# Patient Record
Sex: Male | Born: 1975 | Race: Black or African American | Hispanic: No | Marital: Married | Smoking: Former smoker
Health system: Southern US, Community
[De-identification: ages and names within clinical notes are randomized; demographics above are authoritative.]

## PROBLEM LIST (undated history)

## (undated) DIAGNOSIS — I10 Essential (primary) hypertension: Secondary | ICD-10-CM

## (undated) DIAGNOSIS — I639 Cerebral infarction, unspecified: Secondary | ICD-10-CM

## (undated) DIAGNOSIS — E119 Type 2 diabetes mellitus without complications: Secondary | ICD-10-CM

---

## 2010-07-26 ENCOUNTER — Emergency Department: Payer: Self-pay | Admitting: Emergency Medicine

## 2012-07-22 ENCOUNTER — Emergency Department: Payer: Self-pay | Admitting: Emergency Medicine

## 2016-02-12 ENCOUNTER — Emergency Department
Admission: EM | Admit: 2016-02-12 | Discharge: 2016-02-12 | Disposition: A | Discharge status: Home / Self Care | Payer: Self-pay | Attending: Emergency Medicine | Admitting: Emergency Medicine

## 2016-02-12 ENCOUNTER — Encounter: Payer: Self-pay | Admitting: Emergency Medicine

## 2016-02-12 DIAGNOSIS — E1165 Type 2 diabetes mellitus with hyperglycemia: Secondary | ICD-10-CM | POA: Insufficient documentation

## 2016-02-12 DIAGNOSIS — R739 Hyperglycemia, unspecified: Secondary | ICD-10-CM

## 2016-02-12 DIAGNOSIS — I10 Essential (primary) hypertension: Secondary | ICD-10-CM

## 2016-02-12 DIAGNOSIS — Z794 Long term (current) use of insulin: Secondary | ICD-10-CM | POA: Insufficient documentation

## 2016-02-12 DIAGNOSIS — F17200 Nicotine dependence, unspecified, uncomplicated: Secondary | ICD-10-CM | POA: Insufficient documentation

## 2016-02-12 HISTORY — DX: Type 2 diabetes mellitus without complications: E11.9

## 2016-02-12 HISTORY — DX: Essential (primary) hypertension: I10

## 2016-02-12 LAB — BASIC METABOLIC PANEL
ANION GAP: 6 (ref 5–15)
BUN: 8 mg/dL (ref 6–20)
CHLORIDE: 104 mmol/L (ref 101–111)
CO2: 28 mmol/L (ref 22–32)
Calcium: 9 mg/dL (ref 8.9–10.3)
Creatinine, Ser: 1.12 mg/dL (ref 0.61–1.24)
GFR calc Af Amer: >60 mL/min (ref >60)
GLUCOSE: 147 mg/dL — AB (ref 65–99)
POTASSIUM: 4 mmol/L (ref 3.5–5.1)
SODIUM: 138 mmol/L (ref 135–145)

## 2016-02-12 LAB — GLUCOSE, CAPILLARY: GLUCOSE-CAPILLARY: 230 mg/dL — AB (ref 65–99)

## 2016-02-12 MED ORDER — LISINOPRIL 20 MG PO TABS: 20.0000 mg | ORAL_TABLET | Freq: Every day | ORAL | 0 refills | Status: DC

## 2016-02-12 NOTE — Discharge Instructions (Signed)
As we discussed, though you do have high blood pressure (hypertension), fortunately it is not immediately dangerous at this time however I we'll refill your prescription.  Please continue using your insulin as previously prescribed by your doctor.  Return to the Emergency Department (ED) if you experience any chest pain/pressure/tightness, difficulty breathing, or sudden sweating, or other symptoms that concern you.

## 2016-02-12 NOTE — ED Notes (Signed)
states he felt like his b/p was elevated and feeling dizzy...  also noticed that his glucose is also up and down  Has not had any meds for diabetes or htn..Marland Kitchen

## 2016-02-12 NOTE — ED Provider Notes (Signed)
[facility] Emergency Department Provider Note   ____________________________________________   First MD Initiated Contact with Patient 02/12/16 1410     (approximate)  I have reviewed the triage vital signs and the nursing notes.   HISTORY  Chief Complaint Hypertension    HPI Stanley Farley is a 40 y.o. male presents for evaluation of high blood pressure. Patient tells me for about a month he is not on any blood pressure medicine because his clinic will not refill his medicine without pain for a visit. He also reports he is diabetic and he ran out of insulin for which she takes about 72 units of Lantus daily. He has not had this for about a month.  Over the last several days he's felt occasionally sweaty, lightheaded, and had a mild headache off and on. Not associated nausea vomiting fevers or chills. No numbness tingling or weakness. Reports he checked his blood pressure didn't [facility] and it was over 200 prompting evaluation today.  Unfortunately the patient cannot recall her blood pressure medicine he is on. Reports he's had similar symptoms in the past that improved after blood pressure management.   Past Medical History:  Diagnosis Date  . Diabetes mellitus without complication (HCC)   . Hypertension     There are no active problems to display for this patient.   History reviewed. No pertinent surgical history.  Prior to Admission medications   Medication Sig Start Date End Date Taking? Authorizing Provider  cetirizine (ZYRTEC) 10 MG tablet Take 10 mg by mouth daily.   Yes Historical Provider, MD  clindamycin (CLEOCIN) 300 MG capsule Take 300 mg by mouth 4 (four) times daily.   Yes Historical Provider, MD  glipiZIDE (GLUCOTROL XL) 10 MG 24 hr tablet Take 10 mg by mouth daily with breakfast.   Yes Historical Provider, MD  HYDROcodone-acetaminophen (NORCO/VICODIN) 5-325 MG tablet Take 1 tablet by mouth every 4 (four) hours as needed for  moderate pain.   Yes Historical Provider, MD  ibuprofen (ADVIL,MOTRIN) 800 MG tablet Take 800 mg by mouth every 6 (six) hours as needed.   Yes Historical Provider, MD  insulin detemir (LEVEMIR) 100 UNIT/ML injection Inject 60 Units into the skin at bedtime.   Yes Historical Provider, MD  sildenafil (VIAGRA) 50 MG tablet Take 50 mg by mouth daily as needed for erectile dysfunction.   Yes Historical Provider, MD  lisinopril (PRINIVIL,ZESTRIL) 20 MG tablet Take 1 tablet (20 mg total) by mouth daily. 02/12/16   Sharyn Creamer, MD    Allergies Review of patient's allergies indicates no known allergies.  History reviewed. No pertinent family history.  Social History Social History  Substance Use Topics  . Smoking status: Current Every Day Smoker  . Smokeless tobacco: Never Used  . Alcohol use Yes    Review of Systems Constitutional: No fever/chills Eyes: No visual changes. ENT: No sore throat. Cardiovascular: Denies chest pain. Respiratory: Denies shortness of breath. Gastrointestinal: No abdominal pain.  No nausea, no vomiting.  No diarrhea.  No constipation. Genitourinary: Negative for dysuria. Musculoskeletal: Negative for back pain. Skin: Negative for rash. Neurological: Negative for focal weakness or numbness.  10-point ROS otherwise negative.  ____________________________________________   PHYSICAL EXAM:  VITAL SIGNS: ED Triage Vitals [02/12/16 1111]  Enc Vitals Group     BP (!) 155/90     Pulse Rate 71     Resp 18     Temp 98.9 F (37.2 C)     Temp Source Oral  SpO2 100 %     Weight 225 lb (102.1 kg)     Height 6\' 2"  (1.88 m)     Head Circumference      Peak Flow      Pain Score      Pain Loc      Pain Edu?      Excl. in GC?     Constitutional: Alert and oriented. Well appearing and in no acute distress. Eyes: Conjunctivae are normal. PERRL. EOMI. Head: Atraumatic. Nose: No congestion/rhinnorhea. Mouth/Throat: Mucous membranes are moist.  Neck: No  stridor.   Cardiovascular: Normal rate, regular rhythm. Grossly normal heart sounds.  Good peripheral circulation. Respiratory: Normal respiratory effort.  No retractions. Lungs CTAB. Gastrointestinal: Soft and nontender. No distention.  Musculoskeletal: No lower extremity tenderness nor edema. Neurologic:  Normal speech and language. No gross focal neurologic deficits are appreciated. Skin:  Skin is warm, dry and intact. No rash noted. Psychiatric: Mood and affect are normal. Speech and behavior are normal.  ____________________________________________   LABS (all labs ordered are listed, but only abnormal results are displayed)  Labs Reviewed  GLUCOSE, CAPILLARY - Abnormal; Notable for the following:       Result Value   Glucose-Capillary 230 (*)    All other components within normal limits  BASIC METABOLIC PANEL - Abnormal; Notable for the following:    Glucose, Bld 147 (*)    All other components within normal limits   ____________________________________________  EKG  Reviewed and interpreted me at 1520 Fidgeted rate 65 PR 1:30 QRS 80 QTc 400 Normal sinus rhythm, no evidence of acute ischemic abnormality. Some questionable early repol seen in V3.____________________________________________  RADIOLOGY  No indication noted for imaging. ____________________________________________   PROCEDURES  Procedure(s) performed: None  Procedures  Critical Care performed: No  ____________________________________________   INITIAL IMPRESSION / ASSESSMENT AND PLAN / ED COURSE  Pertinent labs & imaging results that were available during my care of the patient were reviewed by me and considered in my medical decision making (see chart for details).  Patient presents for evaluation of concerns for elevated blood pressure. He ran out of medicine unfortunately is not able to recall what the blood pressure medicine was. Also tells me to refill of his insulin. Evidently he is not  able take obtain primary care right now due to a financial issue, however reports this has improved but he can't get an appointment for a few weeks.  Very reassuring examination. The pressure in elevated, but not emergent level at this time.  Clinical Course   ----------------------------------------- 4:17 PM on 02/12/2016 -----------------------------------------  Patient resting comfortably. Labs, EKG reassuring. Discussed with the patient and he has run out of his lisinopril which I'll refill. I do not see evidence for need for emergent blood pressure lowering today. In addition, we discussed his insulin use he reports that he has prescriptions for his insulin he just has not had $10 to afford to obtain refills. Discussed the importance of compliance, and I also discussed medication management clinic with the patient. We also further discuss financial counseling, and he'll follow-up with his doctor and seek financial counseling and also I recommended he go to medication management clinic for assistance. Return precautions and treatment recommendations and follow-up discussed with the patient who is agreeable with the plan.   ____________________________________________   FINAL CLINICAL IMPRESSION(S) / ED DIAGNOSES  Final diagnoses:  Essential hypertension  Hyperglycemia      NEW MEDICATIONS STARTED DURING THIS VISIT:  Current Discharge  Medication List       Note:  This document was prepared using Dragon voice recognition software and may include unintentional dictation errors.     Sharyn CreamerMark Farren Nelles, MD 02/12/16 [phone]

## 2016-02-12 NOTE — ED Triage Notes (Signed)
Pt presents to ED c/o elevated BP of 200/76 at [facility] this morning. Pt concerned because he has no BP refills. Pt also concerned about blood sugar because he has been unable to check it at home.Pt only reports s/s headaches and sweating.

## 2017-02-01 ENCOUNTER — Inpatient Hospital Stay
Admission: EM | Admit: 2017-02-01 | Discharge: 2017-02-04 | DRG: 065 | Disposition: A | Discharge status: Rehabilitation Facility | Payer: Medicaid Other | Attending: Internal Medicine | Admitting: Internal Medicine

## 2017-02-01 ENCOUNTER — Emergency Department: Payer: Medicaid Other

## 2017-02-01 ENCOUNTER — Encounter: Payer: Self-pay | Admitting: Emergency Medicine

## 2017-02-01 DIAGNOSIS — I638 Other cerebral infarction: Principal | ICD-10-CM | POA: Diagnosis present

## 2017-02-01 DIAGNOSIS — R29710 NIHSS score 10: Secondary | ICD-10-CM | POA: Diagnosis present

## 2017-02-01 DIAGNOSIS — E1142 Type 2 diabetes mellitus with diabetic polyneuropathy: Secondary | ICD-10-CM

## 2017-02-01 DIAGNOSIS — Z8249 Family history of ischemic heart disease and other diseases of the circulatory system: Secondary | ICD-10-CM | POA: Diagnosis not present

## 2017-02-01 DIAGNOSIS — R531 Weakness: Secondary | ICD-10-CM | POA: Diagnosis present

## 2017-02-01 DIAGNOSIS — E119 Type 2 diabetes mellitus without complications: Secondary | ICD-10-CM | POA: Diagnosis present

## 2017-02-01 DIAGNOSIS — N179 Acute kidney failure, unspecified: Secondary | ICD-10-CM | POA: Diagnosis present

## 2017-02-01 DIAGNOSIS — I10 Essential (primary) hypertension: Secondary | ICD-10-CM | POA: Diagnosis present

## 2017-02-01 DIAGNOSIS — Z833 Family history of diabetes mellitus: Secondary | ICD-10-CM

## 2017-02-01 DIAGNOSIS — Z9114 Patient's other noncompliance with medication regimen: Secondary | ICD-10-CM

## 2017-02-01 DIAGNOSIS — E785 Hyperlipidemia, unspecified: Secondary | ICD-10-CM

## 2017-02-01 DIAGNOSIS — G8194 Hemiplegia, unspecified affecting left nondominant side: Secondary | ICD-10-CM | POA: Diagnosis present

## 2017-02-01 DIAGNOSIS — I639 Cerebral infarction, unspecified: Secondary | ICD-10-CM | POA: Diagnosis present

## 2017-02-01 DIAGNOSIS — F17290 Nicotine dependence, other tobacco product, uncomplicated: Secondary | ICD-10-CM | POA: Diagnosis present

## 2017-02-01 DIAGNOSIS — I69354 Hemiplegia and hemiparesis following cerebral infarction affecting left non-dominant side: Secondary | ICD-10-CM

## 2017-02-01 DIAGNOSIS — F1210 Cannabis abuse, uncomplicated: Secondary | ICD-10-CM

## 2017-02-01 DIAGNOSIS — I6789 Other cerebrovascular disease: Secondary | ICD-10-CM | POA: Diagnosis present

## 2017-02-01 DIAGNOSIS — Z823 Family history of stroke: Secondary | ICD-10-CM

## 2017-02-01 DIAGNOSIS — E1165 Type 2 diabetes mellitus with hyperglycemia: Secondary | ICD-10-CM

## 2017-02-01 DIAGNOSIS — Z794 Long term (current) use of insulin: Secondary | ICD-10-CM

## 2017-02-01 DIAGNOSIS — R4781 Slurred speech: Secondary | ICD-10-CM | POA: Diagnosis present

## 2017-02-01 DIAGNOSIS — Z720 Tobacco use: Secondary | ICD-10-CM

## 2017-02-01 HISTORY — DX: Cerebral infarction, unspecified: I63.9

## 2017-02-01 LAB — COMPREHENSIVE METABOLIC PANEL
ALBUMIN: 3.3 g/dL — AB (ref 3.5–5.0)
ALT: 14 U/L — ABNORMAL LOW (ref 17–63)
ANION GAP: 7 (ref 5–15)
AST: 22 U/L (ref 15–41)
Alkaline Phosphatase: 63 U/L (ref 38–126)
BILIRUBIN TOTAL: 0.5 mg/dL (ref 0.3–1.2)
BUN: 22 mg/dL — AB (ref 6–20)
CHLORIDE: 102 mmol/L (ref 101–111)
CO2: 28 mmol/L (ref 22–32)
Calcium: 9 mg/dL (ref 8.9–10.3)
Creatinine, Ser: 1.36 mg/dL — ABNORMAL HIGH (ref 0.61–1.24)
GFR calc Af Amer: >60 mL/min (ref >60)
Glucose, Bld: 310 mg/dL — ABNORMAL HIGH (ref 65–99)
POTASSIUM: 4.2 mmol/L (ref 3.5–5.1)
Sodium: 137 mmol/L (ref 135–145)
Total Protein: 6.9 g/dL (ref 6.5–8.1)

## 2017-02-01 LAB — DIFFERENTIAL
Basophils Absolute: 0 10*3/uL (ref 0–0.1)
Basophils Relative: 1 %
EOS ABS: 0.4 10*3/uL (ref 0–0.7)
EOS PCT: 6 %
LYMPHS ABS: 2.2 10*3/uL (ref 1.0–3.6)
Lymphocytes Relative: 30 %
MONOS PCT: 10 %
Monocytes Absolute: 0.7 10*3/uL (ref 0.2–1.0)
NEUTROS PCT: 53 %
Neutro Abs: 3.8 10*3/uL (ref 1.4–6.5)

## 2017-02-01 LAB — CBC
HCT: 37.6 % — ABNORMAL LOW (ref 40.0–52.0)
HEMOGLOBIN: 13.1 g/dL (ref 13.0–18.0)
MCH: 33.1 pg (ref 26.0–34.0)
MCHC: 34.8 g/dL (ref 32.0–36.0)
MCV: 95 fL (ref 80.0–100.0)
Platelets: 208 10*3/uL (ref 150–440)
RBC: 3.95 MIL/uL — ABNORMAL LOW (ref 4.40–5.90)
RDW: 13.4 % (ref 11.5–14.5)
WBC: 7.2 10*3/uL (ref 3.8–10.6)

## 2017-02-01 LAB — PROTIME-INR
INR: 0.91
Prothrombin Time: 12.2 seconds (ref 11.4–15.2)

## 2017-02-01 LAB — GLUCOSE, CAPILLARY
GLUCOSE-CAPILLARY: 271 mg/dL — AB (ref 65–99)
GLUCOSE-CAPILLARY: 262 mg/dL — AB (ref 65–99)
Glucose-Capillary: 182 mg/dL — ABNORMAL HIGH (ref 65–99)

## 2017-02-01 LAB — APTT: aPTT: 27 seconds (ref 24–36)

## 2017-02-01 LAB — TROPONIN I

## 2017-02-01 MED ORDER — SENNOSIDES-DOCUSATE SODIUM 8.6-50 MG PO TABS: 1.0000 | ORAL_TABLET | Freq: Every evening | ORAL | Status: DC | PRN

## 2017-02-01 MED ORDER — STROKE: EARLY STAGES OF RECOVERY BOOK
Freq: Once | Status: AC
  Administered 2017-02-01: 16:00:00

## 2017-02-01 MED ORDER — ASPIRIN 300 MG RE SUPP: 300.0000 mg | Freq: Every day | RECTAL | Status: DC

## 2017-02-01 MED ORDER — LISINOPRIL 20 MG PO TABS
20.0000 mg | ORAL_TABLET | Freq: Every day | ORAL | Status: DC
  Administered 2017-02-01 – 2017-02-04 (×4): 20 mg via ORAL
  Filled 2017-02-01 (×4): qty 1

## 2017-02-01 MED ORDER — INSULIN ASPART 100 UNIT/ML ~~LOC~~ SOLN: 0.0000 [IU] | Freq: Every day | SUBCUTANEOUS | Status: DC

## 2017-02-01 MED ORDER — ONDANSETRON HCL 4 MG/2ML IJ SOLN
INTRAMUSCULAR | Status: AC
  Administered 2017-02-01: 4 mg via INTRAVENOUS
  Filled 2017-02-01: qty 2

## 2017-02-01 MED ORDER — ACETAMINOPHEN 325 MG PO TABS: 650.0000 mg | ORAL_TABLET | Freq: Four times a day (QID) | ORAL | Status: DC | PRN

## 2017-02-01 MED ORDER — ACETAMINOPHEN 650 MG RE SUPP: 650.0000 mg | RECTAL | Status: DC | PRN

## 2017-02-01 MED ORDER — HYDROXYZINE HCL 25 MG PO TABS
25.0000 mg | ORAL_TABLET | Freq: Three times a day (TID) | ORAL | Status: DC | PRN
  Administered 2017-02-02: 03:00:00 25 mg via ORAL
  Filled 2017-02-01 (×2): qty 1

## 2017-02-01 MED ORDER — IOPAMIDOL (ISOVUE-370) INJECTION 76%
75.0000 mL | Freq: Once | INTRAVENOUS | Status: AC | PRN
  Administered 2017-02-01: 75 mL via INTRAVENOUS

## 2017-02-01 MED ORDER — ASPIRIN 325 MG PO TABS
325.0000 mg | ORAL_TABLET | Freq: Every day | ORAL | Status: DC
  Administered 2017-02-02 – 2017-02-04 (×3): 325 mg via ORAL
  Filled 2017-02-01 (×3): qty 1

## 2017-02-01 MED ORDER — ACETAMINOPHEN 160 MG/5ML PO SOLN
650.0000 mg | ORAL | Status: DC | PRN
  Filled 2017-02-01: qty 20.3

## 2017-02-01 MED ORDER — ONDANSETRON HCL 4 MG/2ML IJ SOLN
4.0000 mg | Freq: Once | INTRAMUSCULAR | Status: AC
  Administered 2017-02-01: 4 mg via INTRAVENOUS

## 2017-02-01 MED ORDER — ACETAMINOPHEN 325 MG PO TABS: 650.0000 mg | ORAL_TABLET | ORAL | Status: DC | PRN

## 2017-02-01 MED ORDER — ENOXAPARIN SODIUM 40 MG/0.4ML ~~LOC~~ SOLN
40.0000 mg | SUBCUTANEOUS | Status: DC
  Administered 2017-02-01 – 2017-02-03 (×3): 40 mg via SUBCUTANEOUS
  Filled 2017-02-01 (×3): qty 0.4

## 2017-02-01 MED ORDER — NICOTINE 14 MG/24HR TD PT24
14.0000 mg | MEDICATED_PATCH | Freq: Every day | TRANSDERMAL | Status: DC
  Administered 2017-02-02 – 2017-02-04 (×3): 14 mg via TRANSDERMAL
  Filled 2017-02-01 (×4): qty 1

## 2017-02-01 MED ORDER — OXYCODONE-ACETAMINOPHEN 5-325 MG PO TABS: 1.0000 | ORAL_TABLET | Freq: Four times a day (QID) | ORAL | Status: DC | PRN

## 2017-02-01 MED ORDER — TRAZODONE HCL 50 MG PO TABS
50.0000 mg | ORAL_TABLET | Freq: Every evening | ORAL | Status: DC | PRN
  Administered 2017-02-01 – 2017-02-03 (×3): 50 mg via ORAL
  Filled 2017-02-01 (×3): qty 1

## 2017-02-01 MED ORDER — SODIUM CHLORIDE 0.9 % IV SOLN
INTRAVENOUS | Status: DC
  Administered 2017-02-01 – 2017-02-02 (×2): via INTRAVENOUS

## 2017-02-01 MED ORDER — ASPIRIN 81 MG PO CHEW
324.0000 mg | CHEWABLE_TABLET | Freq: Once | ORAL | Status: AC
  Administered 2017-02-01: 324 mg via ORAL
  Filled 2017-02-01: qty 4

## 2017-02-01 MED ORDER — ONDANSETRON HCL 4 MG/2ML IJ SOLN: 4.0000 mg | Freq: Four times a day (QID) | INTRAMUSCULAR | Status: DC | PRN

## 2017-02-01 MED ORDER — ATORVASTATIN CALCIUM 20 MG PO TABS
40.0000 mg | ORAL_TABLET | Freq: Every day | ORAL | Status: DC
  Administered 2017-02-01 – 2017-02-03 (×3): 40 mg via ORAL
  Filled 2017-02-01 (×3): qty 2

## 2017-02-01 MED ORDER — INSULIN ASPART 100 UNIT/ML ~~LOC~~ SOLN
0.0000 [IU] | Freq: Three times a day (TID) | SUBCUTANEOUS | Status: DC
  Administered 2017-02-01: 5 [IU] via SUBCUTANEOUS
  Administered 2017-02-02: 3 [IU] via SUBCUTANEOUS
  Administered 2017-02-02: 2 [IU] via SUBCUTANEOUS
  Administered 2017-02-02: 3 [IU] via SUBCUTANEOUS
  Administered 2017-02-03: 2 [IU] via SUBCUTANEOUS
  Administered 2017-02-03: 08:00:00 1 [IU] via SUBCUTANEOUS
  Filled 2017-02-01 (×6): qty 1

## 2017-02-01 NOTE — ED Triage Notes (Signed)
FIRST NURSE NOTE-last normal was last night. Left side weakness. Pt tearful.

## 2017-02-01 NOTE — ED Provider Notes (Signed)
[facility] Emergency Department Provider Note  Time seen: 11:08 AM  I have reviewed the triage vital signs and the nursing notes.   HISTORY  Chief Complaint Weakness    HPI Stanley Farley is a 41 y.o. male with a past medical history of diabetes, hypertension, presents to the emergency department for left arm and leg weakness. According to the patient since awakening this morning he has been experiencing left arm and leg weakness. No history of CVA in the past. Patient also nauseated with vomiting in the emergency department and hypertensive 175/86. Patient is prescribed hypertensive medications which he states she has been taking.Patient denies any headache or confusion. Denies any facial weakness or numbness. Patient denies any trouble speaking. Patient states his left arm and leg are weak but feel normal.  Past Medical History:  Diagnosis Date  . Diabetes mellitus without complication (HCC)   . Hypertension     There are no active problems to display for this patient.   History reviewed. No pertinent surgical history.  Prior to Admission medications   Medication Sig Start Date End Date Taking? Authorizing Provider  cetirizine (ZYRTEC) 10 MG tablet Take 10 mg by mouth daily.    [provider]  clindamycin (CLEOCIN) 300 MG capsule Take 300 mg by mouth 4 (four) times daily.    [provider]  glipiZIDE (GLUCOTROL XL) 10 MG 24 hr tablet Take 10 mg by mouth daily with breakfast.    [provider]  HYDROcodone-acetaminophen (NORCO/VICODIN) 5-325 MG tablet Take 1 tablet by mouth every 4 (four) hours as needed for moderate pain.    [provider]  ibuprofen (ADVIL,MOTRIN) 800 MG tablet Take 800 mg by mouth every 6 (six) hours as needed.    [provider]  insulin detemir (LEVEMIR) 100 UNIT/ML injection Inject 60 Units into the skin at bedtime.    [provider]  lisinopril (PRINIVIL,ZESTRIL) 20 MG  tablet Take 1 tablet (20 mg total) by mouth daily. 02/12/16   Sharyn CreamerQuale, Mark, MD  sildenafil (VIAGRA) 50 MG tablet Take 50 mg by mouth daily as needed for erectile dysfunction.    [provider]    No Known Allergies  No family history on file.  Social History Social History  Substance Use Topics  . Smoking status: Current Every Day Smoker  . Smokeless tobacco: Never Used  . Alcohol use No    Review of Systems Constitutional: Negative for fever. Cardiovascular: Negative for chest pain. Respiratory: Negative for shortness of breath. Gastrointestinal: Negative for abdominal pain. Nausea and vomiting in the emergency department none at home. Negative for diarrhea. Musculoskeletal: Negative for back pain, negative for neck pain. Neurological: Negative for headache. Weakness in the left arm and leg, no numbness. All other ROS negative  ____________________________________________   PHYSICAL EXAM:  VITAL SIGNS: ED Triage Vitals  Enc Vitals Group     BP 02/01/17 1028 (!) 175/86     Pulse Rate 02/01/17 1028 68     Resp 02/01/17 1028 20     Temp 02/01/17 1028 98.2 F (36.8 C)     Temp Source 02/01/17 1028 Oral     SpO2 02/01/17 1028 100 %     Weight 02/01/17 1029 225 lb (102.1 kg)     Height 02/01/17 1029 6\' 2"  (1.88 m)     Head Circumference --      Peak Flow --      Pain Score --      Pain Loc --  Pain Edu? --      Excl. in GC? --     Constitutional: Alert and oriented. Lying in bed, no distress. Eyes: Normal exam ENT   Head: Normocephalic and atraumatic.   Mouth/Throat: Mucous membranes are moist. Cardiovascular: Normal rate, regular rhythm. No murmur Respiratory: Normal respiratory effort without tachypnea nor retractions. Breath sounds are clear  Gastrointestinal: Soft and nontender. No distention.   Musculoskeletal: Patient is able to move all extremities although left-sided extremities are much more weak than right-sided. Neurologic:  Patient  has slight slurring of speech although he denies any difficulty speaking and feels like he is speaking normally. No cranial nerve deficits identified. They should has significant weakness of the left upper and lower extremity, 3/5. Sensation is intact and equal. Left upper and lower extremity drift after one or 2 seconds. Skin:  Skin is warm, dry and intact.  Psychiatric: Mood and affect are normal.   ____________________________________________    EKG  EKG reviewed and interpreted by myself shows normal sinus rhythm at 64 bpm, narrow QRS, normal axis, normal intervals, no ST changes. Overall reassuring EKG.  ____________________________________________    RADIOLOGY  CT head negative  ____________________________________________   INITIAL IMPRESSION / ASSESSMENT AND PLAN / ED COURSE  Pertinent labs & imaging results that were available during my care of the patient were reviewed by me and considered in my medical decision making (see chart for details).  Patient presents to the emergency department with significant left upper and lower extremity weakness. Patient has significant deficits on exam, became acutely nauseated with several episodes of vomiting in the emergency department. Blood pressure currently 175/86. Besides the weakness the patient's neurological exam is otherwise intact besides mild slurring of speech although he denies any slurred speech. Given the patient's acute onset of symptoms we will proceed with a CT angiography of the head and neck to rule out any large vessel occlusion as well as an MRI. Patient will require admission to the hospital for further treatment. Unfortunately as the patient awoke this morning with symptoms there is no clear onset of symptoms. Patient states he went to bed around midnight last night and was normal. He feels like the left arm and leg weakness has progressively worsened throughout the day today. Patient is outside of any TPA window.   CT  is negative we will dose 324 mg aspirin in the emergency department.   NIH Stroke Scale   Interval: Baseline Time: 11:14 AM Person Administering Scale: Lorina Duffner  Administer stroke scale items in the order listed. Record performance in each category after each subscale exam. Do not go back and change scores. Follow directions provided for each exam technique. Scores should reflect what the patient does, not what the clinician thinks the patient can do. The clinician should record answers while administering the exam and work quickly. Except where indicated, the patient should not be coached (i.e., repeated requests to patient to make a special effort).   1a  Level of consciousness: 0=alert; keenly responsive  1b. LOC questions:  0=Performs both tasks correctly  1c. LOC commands: 0=Performs both tasks correctly  2.  Best Gaze: 0=normal  3.  Visual: 0=No visual loss  4. Facial Palsy: 0=Normal symmetric movement  5a.  Motor left arm: 3=No effort against gravity, limb falls  5b.  Motor right arm: 0=No drift, limb holds 90 (or 45) degrees for full 10 seconds  6a. motor left leg: 3=No effort against gravity, limb falls  6b  Motor right  leg:  0=No drift, limb holds 90 (or 45) degrees for full 10 seconds  7. Limb Ataxia: 2=Present in two limbs  8.  Sensory: 0=Normal; no sensory loss  9. Best Language:  0=No aphasia, normal  10. Dysarthria: 1=Mild to moderate, patient slurs at least some words and at worst, can be understood with some difficulty  11. Extinction and Inattention: 0=No abnormality  12. Distal motor function: 1=At least some extension after 5 seconds, but is not fully extended. Any movement of the fingers which is not in response to a command is not scored   Total:   10    CT angiography of the head and neck is read as negative. Patient continues with left-sided deficits. Concern for acute CVA. Patient will be admitted to the hospital for further management. MRI  pending. ____________________________________________   FINAL CLINICAL IMPRESSION(S) / ED DIAGNOSES  Left-sided weakness CVA   Minna Antis, MD 02/01/17 1156

## 2017-02-01 NOTE — ED Triage Notes (Signed)
Pt reports woke up this morning with left side weakness, slurred speech reports went to bed last night around midnight. Pt denies any pain. Pt talks in complete sentences

## 2017-02-01 NOTE — H&P (Addendum)
[employer] [facility] [facility] [place] [facility]   PATIENT NAME: Stanley Farley    MR#:  [id number]  DATE OF BIRTH:  [birth year]  DATE OF ADMISSION:  02/01/2017  PRIMARY CARE PHYSICIAN: Emogene MorganAycock, Ngwe A, MD   REQUESTING/REFERRING PHYSICIAN: Minna AntisPaduchowski, Kevin, MD  CHIEF COMPLAINT:   Chief Complaint  Patient presents with  . Weakness   Left-sided weakness today. HISTORY OF PRESENT ILLNESS:  Stanley Shelterravis Wachob  is a 41 y.o. male with a known history of Hypertension and diabetes. The patient presently ED with the left arm and leg weakness starting at 9 a.m. today. The patient had nausea and vomiting but denies any sensation changes, no slurred speech or incontinence. He denies any headache or dizziness. He denies any depression or anxiety but has distress from family. He was given aspirin in the ED but left-sided weakness is still not improving. The patient hasn't taken glipizide and Levemir for 1 month due to hypoglycemia.CT head angiogram didn't show any acute stroke or large vessel occlusion.  PAST MEDICAL HISTORY:   Past Medical History:  Diagnosis Date  . Diabetes mellitus without complication (HCC)   . Hypertension     PAST SURGICAL HISTORY:  History reviewed. No pertinent surgical history. No surgical history.  SOCIAL HISTORY:   Social History  Substance Use Topics  . Smoking status: Current Every Day Smoker    Years: 5.00    Types: Cigars  . Smokeless tobacco: Never Used  . Alcohol use No    FAMILY HISTORY:   Family History  Problem Relation Age of Onset  . Diabetes Mother   . Hypertension Father   . Heart attack Father     DRUG ALLERGIES:  No Known Allergies  REVIEW OF SYSTEMS:   Review of Systems  Constitutional: Negative for chills, fever and malaise/fatigue.  HENT: Negative for sore throat.   Eyes: Negative for blurred vision and double vision.  Respiratory: Negative for cough, hemoptysis, shortness of breath, wheezing and stridor.   Cardiovascular:  Negative for chest pain, palpitations, orthopnea and leg swelling.  Gastrointestinal: Positive for nausea and vomiting. Negative for abdominal pain, blood in stool, diarrhea and melena.  Genitourinary: Negative for dysuria, flank pain and hematuria.  Musculoskeletal: Negative for back pain and joint pain.  Neurological: Positive for focal weakness. Negative for dizziness, sensory change, seizures, loss of consciousness, weakness and headaches.  Endo/Heme/Allergies: Negative for polydipsia.  Psychiatric/Behavioral: Negative for depression. The patient is not nervous/anxious.     MEDICATIONS AT HOME:   Prior to Admission medications   Medication Sig Start Date End Date Taking? Authorizing Provider  glipiZIDE (GLUCOTROL XL) 10 MG 24 hr tablet Take 10 mg by mouth daily with breakfast.   Yes [provider]  insulin detemir (LEVEMIR) 100 UNIT/ML injection Inject 30 Units into the skin daily.    Yes [provider]  lisinopril (PRINIVIL,ZESTRIL) 20 MG tablet Take 1 tablet (20 mg total) by mouth daily. 02/12/16  Yes Sharyn CreamerQuale, Mark, MD  sildenafil (VIAGRA) 50 MG tablet Take 50 mg by mouth daily as needed for erectile dysfunction.   Yes [provider]      VITAL SIGNS:  Blood pressure (!) 175/106, pulse (!) 114, temperature 98.2 F (36.8 C), temperature source Oral, resp. rate 19, height 6\' 2"  (1.88 m), weight 225 lb (102.1 kg), SpO2 (!) 87 %.  PHYSICAL EXAMINATION:  Physical Exam  GENERAL:  41 y.o.-year-old patient lying in the bed with no acute distress.  EYES: Pupils equal, round, reactive to  light and accommodation. No scleral icterus. Extraocular muscles intact.  HEENT: Head atraumatic, normocephalic. Oropharynx and nasopharynx clear.  NECK:  Supple, no jugular venous distention. No thyroid enlargement, no tenderness.  LUNGS: Normal breath sounds bilaterally, no wheezing, rales,rhonchi or crepitation. No use of accessory muscles of respiration.  CARDIOVASCULAR: S1,  S2 normal. No murmurs, rubs, or gallops.  ABDOMEN: Soft, nontender, nondistended. Bowel sounds present. No organomegaly or mass.  EXTREMITIES: No pedal edema, cyanosis, or clubbing.  NEUROLOGIC: Cranial nerves II through XII are intact. Muscle strength 3/5 in left arm and leg, 5/5 in right extremities. Sensation intact. Gait not checked.  PSYCHIATRIC: The patient is alert and oriented x 3.  SKIN: No obvious rash, lesion, or ulcer.   LABORATORY PANEL:   CBC  Recent Labs Lab 02/01/17 1032  WBC 7.2  HGB 13.1  HCT 37.6*  PLT 208   ------------------------------------------------------------------------------------------------------------------  Chemistries   Recent Labs Lab 02/01/17 1032  NA 137  K 4.2  CL 102  CO2 28  GLUCOSE 310*  BUN 22*  CREATININE 1.36*  CALCIUM 9.0  AST 22  ALT 14*  ALKPHOS 63  BILITOT 0.5   ------------------------------------------------------------------------------------------------------------------  Cardiac Enzymes  Recent Labs Lab 02/01/17 1032  TROPONINI <0.03   ------------------------------------------------------------------------------------------------------------------  RADIOLOGY:  Ct Angio Head W Or Wo Contrast  Result Date: 02/01/2017 CLINICAL DATA:  Left-sided weakness and slurred speech. EXAM: CT ANGIOGRAPHY HEAD AND NECK TECHNIQUE: Multidetector CT imaging of the head and neck was performed using the standard protocol during bolus administration of intravenous contrast. Multiplanar CT image reconstructions and MIPs were obtained to evaluate the vascular anatomy. Carotid stenosis measurements (when applicable) are obtained utilizing NASCET criteria, using the distal internal carotid diameter as the denominator. CONTRAST:  75 mL Isovue 370 COMPARISON:  None. FINDINGS: CTA NECK FINDINGS Aortic arch: Standard 3 vessel aortic arch. Brachiocephalic and subclavian arteries are widely patent. Right carotid system: Patent without  evidence of stenosis, dissection, or significant atherosclerosis. Left carotid system: Patent without evidence of stenosis, dissection, or significant atherosclerosis. Vertebral arteries: Patent and codominant without evidence of stenosis, dissection, or significant atherosclerosis. Skeleton: Mild disc space narrowing and moderate degenerative endplate spurring at C5-6. Other neck: No mass or lymph node enlargement. Poor dentition with multiple caries. Upper chest: Clear lung apices. Review of the MIP images confirms the above findings CTA HEAD FINDINGS Anterior circulation: The internal carotid arteries are widely patent from skullbase to carotid termini. The ACAs and MCAs are patent without evidence of proximal branch occlusion or significant proximal stenosis. Diffuse branch vessel irregularity is attributed to technique. No aneurysm. Posterior circulation: The intracranial vertebral arteries are widely patent to the basilar. Right PICA, left AICA, and bilateral SCA origins are patent. The basilar artery is widely patent. There are patent posterior communicating arteries, right larger than left. The right P1 segment is hypoplastic. PCAs are patent with mild diffuse irregularity attributed to technique. No evidence of flow limiting proximal stenosis. No aneurysm. Venous sinuses: Patent. Anatomic variants: Predominantly fetal contribution to the right PCA. Delayed phase: No abnormal enhancement. Review of the MIP images confirms the above findings IMPRESSION: Negative head and neck CTA. No large vessel occlusion or significant proximal stenosis. Electronically Signed   By: Sebastian Ache M.D.   On: 02/01/2017 11:49   Ct Head Wo Contrast  Result Date: 02/01/2017 CLINICAL DATA:  Pt reports woke up this morning with left side weakness, slurred speech reports went to bed last night around midnight. Pt denies any pain. Pt talks in  complete sentences EXAM: CT HEAD WITHOUT CONTRAST TECHNIQUE: Contiguous axial images  were obtained from the base of the skull through the vertex without intravenous contrast. COMPARISON:  01/09/1998 by report only FINDINGS: Brain: No evidence of acute infarction, hemorrhage, hydrocephalus, extra-axial collection or mass lesion/mass effect. Vascular: No hyperdense vessel or unexpected calcification. Skull: Normal. Negative for fracture or focal lesion. Sinuses/Orbits: No acute finding. Other: None. IMPRESSION: 1. Negative for bleed or other acute intracranial process. Electronically Signed   By: Corlis Leak M.D.   On: 02/01/2017 10:59   Ct Angio Neck W And/or Wo Contrast  Result Date: 02/01/2017 CLINICAL DATA:  Left-sided weakness and slurred speech. EXAM: CT ANGIOGRAPHY HEAD AND NECK TECHNIQUE: Multidetector CT imaging of the head and neck was performed using the standard protocol during bolus administration of intravenous contrast. Multiplanar CT image reconstructions and MIPs were obtained to evaluate the vascular anatomy. Carotid stenosis measurements (when applicable) are obtained utilizing NASCET criteria, using the distal internal carotid diameter as the denominator. CONTRAST:  75 mL Isovue 370 COMPARISON:  None. FINDINGS: CTA NECK FINDINGS Aortic arch: Standard 3 vessel aortic arch. Brachiocephalic and subclavian arteries are widely patent. Right carotid system: Patent without evidence of stenosis, dissection, or significant atherosclerosis. Left carotid system: Patent without evidence of stenosis, dissection, or significant atherosclerosis. Vertebral arteries: Patent and codominant without evidence of stenosis, dissection, or significant atherosclerosis. Skeleton: Mild disc space narrowing and moderate degenerative endplate spurring at C5-6. Other neck: No mass or lymph node enlargement. Poor dentition with multiple caries. Upper chest: Clear lung apices. Review of the MIP images confirms the above findings CTA HEAD FINDINGS Anterior circulation: The internal carotid arteries are widely  patent from skullbase to carotid termini. The ACAs and MCAs are patent without evidence of proximal branch occlusion or significant proximal stenosis. Diffuse branch vessel irregularity is attributed to technique. No aneurysm. Posterior circulation: The intracranial vertebral arteries are widely patent to the basilar. Right PICA, left AICA, and bilateral SCA origins are patent. The basilar artery is widely patent. There are patent posterior communicating arteries, right larger than left. The right P1 segment is hypoplastic. PCAs are patent with mild diffuse irregularity attributed to technique. No evidence of flow limiting proximal stenosis. No aneurysm. Venous sinuses: Patent. Anatomic variants: Predominantly fetal contribution to the right PCA. Delayed phase: No abnormal enhancement. Review of the MIP images confirms the above findings IMPRESSION: Negative head and neck CTA. No large vessel occlusion or significant proximal stenosis. Electronically Signed   By: Sebastian Ache M.D.   On: 02/01/2017 11:49   Mr Brain Wo Contrast  Result Date: 02/01/2017 CLINICAL DATA:  Left-sided weakness and slurred speech. EXAM: MRI HEAD WITHOUT CONTRAST TECHNIQUE: Multiplanar, multiecho pulse sequences of the brain and surrounding structures were obtained without intravenous contrast. COMPARISON:  Head CT earlier today FINDINGS: The patient terminated the examination prematurely. A coronal T2 sequence was not obtained. Brain: There is a 1 cm acute infarct in the posterior limb of the right internal capsule. No intracranial hemorrhage, mass, midline shift, or extra-axial fluid collection is identified. There is focal encephalomalacia involving the posterior body of the corpus callosum bilaterally. T2 shine through is noted in this location on diffusion imaging. There is also a subcentimeter focus of encephalomalacia in the right subinsular white matter. The ventricles and sulci are normal in size. Vascular: Major intracranial  vascular flow voids are preserved. Skull and upper cervical spine: Unremarkable bone marrow signal. Sinuses/Orbits: Unremarkable orbits. Minimal right maxillary sinus mucosal thickening. No significant mastoid  fluid. Other: None. IMPRESSION: 1. 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 2. 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 Electronically Signed   By: Sebastian AcheAllen  Grady M.D.   On: 02/01/2017 14:12      IMPRESSION AND PLAN:   Acute CVA. The patient is being admitted to Medical floor with telemetry monitor. Follow up MRI of the brain, echocardiogram, PT and OT evaluation. Neuro check. Neurology consult. Continue aspirin, add Lipitor, check lipid panel.  Acute renal failure. Start IV fluid support and follow-up BMP.  Hypertension. Continue lisinopril. Allow permissive blood pressure. Diabetes. Hold glipizide and Levemir, start sliding scale. Tobacco abuse. Smoking cessation was counseled for 4 minutes, nicotine patch.  All the records are reviewed and case discussed with ED provider. Management plans discussed with the patient, his wife and they are in agreement.  CODE STATUS: Full code  TOTAL TIME TAKING CARE OF THIS PATIENT: 55 minutes.    Shaune Pollackhen, Yony Roulston M.D on 02/01/2017 at 2:22 PM  Between 7am to 6pm - Pager - [phone]  After 6pm go to [email or web address] - Scientist, research (life sciences)password [email or web address]  [facility]  Office  [phone]  CC: Primary care physician; Emogene MorganAycock, Ngwe A, MD   Note: This dictation was prepared with Dragon dictation along with smaller phrase technology. Any transcriptional errors that result from this process are unintentional.

## 2017-02-01 NOTE — Plan of Care (Signed)
Pt is positive for stroke.  Significant weakness on L side and also presenting with slurred speech.  He is so eager to recover that I'm concerned that he will try to get up and hurt himself. Bed alarm is on and his wife is planning to stay.  Yellow arm band is in place. NIHSS = 8.  Per wife, pt hasn't been compliant in checking his blood sugar or taking his medication out of concern he would go low.  May need education for this.

## 2017-02-01 NOTE — ED Notes (Signed)
CBG 271  

## 2017-02-02 ENCOUNTER — Inpatient Hospital Stay
Admit: 2017-02-02 | Discharge: 2017-02-02 | Disposition: A | Discharge status: Home / Self Care | Payer: Medicaid Other | Attending: Internal Medicine | Admitting: Internal Medicine

## 2017-02-02 DIAGNOSIS — I639 Cerebral infarction, unspecified: Secondary | ICD-10-CM

## 2017-02-02 LAB — HEMOGLOBIN A1C
HEMOGLOBIN A1C: 10.1 % — AB (ref 4.8–5.6)
Mean Plasma Glucose: 243 mg/dL

## 2017-02-02 LAB — GLUCOSE, CAPILLARY
GLUCOSE-CAPILLARY: 216 mg/dL — AB (ref 65–99)
GLUCOSE-CAPILLARY: 221 mg/dL — AB (ref 65–99)
GLUCOSE-CAPILLARY: 192 mg/dL — AB (ref 65–99)
Glucose-Capillary: 198 mg/dL — ABNORMAL HIGH (ref 65–99)

## 2017-02-02 LAB — LIPID PANEL
CHOLESTEROL: 190 mg/dL (ref 0–200)
HDL: 29 mg/dL — AB (ref >40)
LDL Cholesterol: 90 mg/dL (ref 0–99)
TRIGLYCERIDES: 354 mg/dL — AB (ref <150)
Total CHOL/HDL Ratio: 6.6 RATIO
VLDL: 71 mg/dL — ABNORMAL HIGH (ref 0–40)

## 2017-02-02 MED ORDER — CLONAZEPAM 0.5 MG PO TABS
2.0000 mg | ORAL_TABLET | Freq: Once | ORAL | Status: AC
  Administered 2017-02-02: 2 mg via ORAL
  Filled 2017-02-02: qty 4

## 2017-02-02 MED ORDER — GLIPIZIDE ER 10 MG PO TB24
10.0000 mg | ORAL_TABLET | Freq: Every day | ORAL | Status: DC
  Administered 2017-02-03 – 2017-02-04 (×2): 10 mg via ORAL
  Filled 2017-02-02 (×2): qty 1

## 2017-02-02 MED ORDER — LIVING WELL WITH DIABETES BOOK
Freq: Once | Status: AC
  Administered 2017-02-02: 14:00:00
  Filled 2017-02-02: qty 1

## 2017-02-02 MED ORDER — INSULIN GLARGINE 100 UNIT/ML ~~LOC~~ SOLN
15.0000 [IU] | Freq: Every day | SUBCUTANEOUS | Status: DC
  Administered 2017-02-02 – 2017-02-04 (×3): 15 [IU] via SUBCUTANEOUS
  Filled 2017-02-02 (×3): qty 0.15

## 2017-02-02 NOTE — Consult Note (Signed)
Referring Physician: Nemiah CommanderKalisetti    Chief Complaint: Left sided weakness  HPI: Stanley Farley is an 41 y.o. male who reports going to bed at baseline early on the 29th.  Awakened later that morning with left sided weakness.  Patient had some nausea and vomiting as well.  With no improvement in his symptoms presented to the ED.  BP elevated on presentation.  Recently taken off of his DM medications due to hypoglycemia.  Reports he has not been checking his blood sugars.   Initial NIHSS of 8.    Date last known well: Date: 02/01/2017 Time last known well: Time: 00:27 tPA Given: No: Outside time window  Past Medical History:  Diagnosis Date  . Diabetes mellitus without complication (HCC)   . Hypertension     History reviewed. No pertinent surgical history.  Family History  Problem Relation Age of Onset  . Diabetes Mother   . Hypertension Father   . Heart attack Father    Social History:  reports that he has been smoking Cigars.  He has smoked for the past 5.00 years. He has never used smokeless tobacco. He reports that he uses drugs, including Marijuana. He reports that he does not drink alcohol.  Allergies: No Known Allergies  Medications:  I have reviewed the patient's current medications. Prior to Admission:  Prescriptions Prior to Admission  Medication Sig Dispense Refill Last Dose  . glipiZIDE (GLUCOTROL XL) 10 MG 24 hr tablet Take 10 mg by mouth daily with breakfast.   Past Month at Unknown time  . insulin detemir (LEVEMIR) 100 UNIT/ML injection Inject 30 Units into the skin daily.    Past Month at Unknown time  . lisinopril (PRINIVIL,ZESTRIL) 20 MG tablet Take 1 tablet (20 mg total) by mouth daily. 60 tablet 0 Past Month at Unknown time   Scheduled: . aspirin  300 mg Rectal Daily   Or  . aspirin  325 mg Oral Daily  . atorvastatin  40 mg Oral q1800  . enoxaparin (LOVENOX) injection  40 mg Subcutaneous Q24H  . insulin aspart  0-5 Units Subcutaneous QHS  . insulin aspart   0-9 Units Subcutaneous TID WC  . insulin glargine  15 Units Subcutaneous Daily  . lisinopril  20 mg Oral Daily  . nicotine  14 mg Transdermal Daily    ROS: History obtained from the patient  General ROS: negative for - chills, fatigue, fever, night sweats, weight gain or weight loss Psychological ROS: negative for - behavioral disorder, hallucinations, memory difficulties, mood swings or suicidal ideation Ophthalmic ROS: negative for - blurry vision, double vision, eye pain or loss of vision ENT ROS: negative for - epistaxis, nasal discharge, oral lesions, sore throat, tinnitus or vertigo Allergy and Immunology ROS: negative for - hives or itchy/watery eyes Hematological and Lymphatic ROS: negative for - bleeding problems, bruising or swollen lymph nodes Endocrine ROS: negative for - galactorrhea, hair pattern changes, polydipsia/polyuria or temperature intolerance Respiratory ROS: negative for - cough, hemoptysis, shortness of breath or wheezing Cardiovascular ROS: negative for - chest pain, dyspnea on exertion, edema or irregular heartbeat Gastrointestinal ROS: as noted in HPI Genito-Urinary ROS: negative for - dysuria, hematuria, incontinence or urinary frequency/urgency Musculoskeletal ROS: negative for - joint swelling or muscular weakness Neurological ROS: as noted in HPI Dermatological ROS: negative for rash and skin lesion changes  Physical Examination: Blood pressure (!) 145/69, pulse 67, temperature 98.7 F (37.1 C), temperature source Oral, resp. rate 11, height 6\' 2"  (1.88 m), weight 102.1 kg (  225 lb), SpO2 98 %.  HEENT-  Normocephalic, no lesions, without obvious abnormality.  Normal external eye and conjunctiva.  Normal TM's bilaterally.  Normal auditory canals and external ears. Normal external nose, mucus membranes and septum.  Normal pharynx. Cardiovascular- S1, S2 normal, pulses palpable throughout   Lungs- chest clear, no wheezing, rales, normal symmetric air  entry Abdomen- soft, non-tender; bowel sounds normal; no masses,  no organomegaly Extremities- no edema Lymph-no adenopathy palpable Musculoskeletal-no joint tenderness, deformity or swelling Skin-warm and dry, no hyperpigmentation, vitiligo, or suspicious lesions  Neurological Examination   Mental Status: Alert, oriented, thought content appropriate.  Speech fluent without evidence of aphasia.  Dysarthric.  Able to follow 3 step commands without difficulty. Cranial Nerves: II: Discs flat bilaterally; Visual fields grossly normal, pupils equal, round, reactive to light and accommodation III,IV, VI: ptosis not present, extra-ocular motions intact bilaterally V,VII: left facial droop, facial light touch sensation normal bilaterally VIII: hearing normal bilaterally IX,X: gag reflex present XI: bilateral shoulder shrug XII: tongue deviation to the left Motor: Right : Upper extremity   5/5    Left:     Upper extremity   1/5  Lower extremity   5/5     Lower extremity   0/5 Decreased tone on the left Sensory: Pinprick and light touch intact throughout, bilaterally Deep Tendon Reflexes: 1+ in the upper extremities and absent in the lower extremities Plantars: Right: mute   Left: mute Cerebellar: Normal finger-to-nose and normal heel-to-shin testing on the right.  Unable to perform on the left due to weakness Gait: not tested due to safety concerns    Laboratory Studies:  Basic Metabolic Panel:  Recent Labs Lab 02/01/17 1032  NA 137  K 4.2  CL 102  CO2 28  GLUCOSE 310*  BUN 22*  CREATININE 1.36*  CALCIUM 9.0    Liver Function Tests:  Recent Labs Lab 02/01/17 1032  AST 22  ALT 14*  ALKPHOS 63  BILITOT 0.5  PROT 6.9  ALBUMIN 3.3*   No results for input(s): LIPASE, AMYLASE in the last 168 hours. No results for input(s): AMMONIA in the last 168 hours.  CBC:  Recent Labs Lab 02/01/17 1032  WBC 7.2  NEUTROABS 3.8  HGB 13.1  HCT 37.6*  MCV 95.0  PLT 208     Cardiac Enzymes:  Recent Labs Lab 02/01/17 1032  TROPONINI <0.03    BNP: Invalid input(s): POCBNP  CBG:  Recent Labs Lab 02/01/17 1032 02/01/17 1731 02/01/17 2130 02/02/17 0734  GLUCAP 271* 262* 182* 216*    Microbiology: No results found for this or any previous visit.  Coagulation Studies:  Recent Labs  02/01/17 1032  LABPROT 12.2  INR 0.91    Urinalysis: No results for input(s): COLORURINE, LABSPEC, PHURINE, GLUCOSEU, HGBUR, BILIRUBINUR, KETONESUR, PROTEINUR, UROBILINOGEN, NITRITE, LEUKOCYTESUR in the last 168 hours.  Invalid input(s): APPERANCEUR  Lipid Panel:    Component Value Date/Time   CHOL 190 02/02/2017 0454   TRIG 354 (H) 02/02/2017 0454   HDL 29 (L) 02/02/2017 0454   CHOLHDL 6.6 02/02/2017 0454   VLDL 71 (H) 02/02/2017 0454   LDLCALC 90 02/02/2017 0454    HgbA1C: No results found for: HGBA1C  Urine Drug Screen:  No results found for: LABOPIA, COCAINSCRNUR, LABBENZ, AMPHETMU, THCU, LABBARB  Alcohol Level: No results for input(s): ETH in the last 168 hours.  Other results: EKG: normal sinus rhythm at 64 bpm.  Imaging: Ct Angio Head W Or Wo Contrast  Result Date: 02/01/2017 CLINICAL  DATA:  Left-sided weakness and slurred speech. EXAM: CT ANGIOGRAPHY HEAD AND NECK TECHNIQUE: Multidetector CT imaging of the head and neck was performed using the standard protocol during bolus administration of intravenous contrast. Multiplanar CT image reconstructions and MIPs were obtained to evaluate the vascular anatomy. Carotid stenosis measurements (when applicable) are obtained utilizing NASCET criteria, using the distal internal carotid diameter as the denominator. CONTRAST:  75 mL Isovue 370 COMPARISON:  None. FINDINGS: CTA NECK FINDINGS Aortic arch: Standard 3 vessel aortic arch. Brachiocephalic and subclavian arteries are widely patent. Right carotid system: Patent without evidence of stenosis, dissection, or significant atherosclerosis. Left carotid  system: Patent without evidence of stenosis, dissection, or significant atherosclerosis. Vertebral arteries: Patent and codominant without evidence of stenosis, dissection, or significant atherosclerosis. Skeleton: Mild disc space narrowing and moderate degenerative endplate spurring at C5-6. Other neck: No mass or lymph node enlargement. Poor dentition with multiple caries. Upper chest: Clear lung apices. Review of the MIP images confirms the above findings CTA HEAD FINDINGS Anterior circulation: The internal carotid arteries are widely patent from skullbase to carotid termini. The ACAs and MCAs are patent without evidence of proximal branch occlusion or significant proximal stenosis. Diffuse branch vessel irregularity is attributed to technique. No aneurysm. Posterior circulation: The intracranial vertebral arteries are widely patent to the basilar. Right PICA, left AICA, and bilateral SCA origins are patent. The basilar artery is widely patent. There are patent posterior communicating arteries, right larger than left. The right P1 segment is hypoplastic. PCAs are patent with mild diffuse irregularity attributed to technique. No evidence of flow limiting proximal stenosis. No aneurysm. Venous sinuses: Patent. Anatomic variants: Predominantly fetal contribution to the right PCA. Delayed phase: No abnormal enhancement. Review of the MIP images confirms the above findings IMPRESSION: Negative head and neck CTA. No large vessel occlusion or significant proximal stenosis. Electronically Signed   By: Sebastian Ache M.D.   On: 02/01/2017 11:49   Ct Head Wo Contrast  Result Date: 02/01/2017 CLINICAL DATA:  Pt reports woke up this morning with left side weakness, slurred speech reports went to bed last night around midnight. Pt denies any pain. Pt talks in complete sentences EXAM: CT HEAD WITHOUT CONTRAST TECHNIQUE: Contiguous axial images were obtained from the base of the skull through the vertex without intravenous  contrast. COMPARISON:  01/09/1998 by report only FINDINGS: Brain: No evidence of acute infarction, hemorrhage, hydrocephalus, extra-axial collection or mass lesion/mass effect. Vascular: No hyperdense vessel or unexpected calcification. Skull: Normal. Negative for fracture or focal lesion. Sinuses/Orbits: No acute finding. Other: None. IMPRESSION: 1. Negative for bleed or other acute intracranial process. Electronically Signed   By: Corlis Leak M.D.   On: 02/01/2017 10:59   Ct Angio Neck W And/or Wo Contrast  Result Date: 02/01/2017 CLINICAL DATA:  Left-sided weakness and slurred speech. EXAM: CT ANGIOGRAPHY HEAD AND NECK TECHNIQUE: Multidetector CT imaging of the head and neck was performed using the standard protocol during bolus administration of intravenous contrast. Multiplanar CT image reconstructions and MIPs were obtained to evaluate the vascular anatomy. Carotid stenosis measurements (when applicable) are obtained utilizing NASCET criteria, using the distal internal carotid diameter as the denominator. CONTRAST:  75 mL Isovue 370 COMPARISON:  None. FINDINGS: CTA NECK FINDINGS Aortic arch: Standard 3 vessel aortic arch. Brachiocephalic and subclavian arteries are widely patent. Right carotid system: Patent without evidence of stenosis, dissection, or significant atherosclerosis. Left carotid system: Patent without evidence of stenosis, dissection, or significant atherosclerosis. Vertebral arteries: Patent and codominant without evidence  of stenosis, dissection, or significant atherosclerosis. Skeleton: Mild disc space narrowing and moderate degenerative endplate spurring at C5-6. Other neck: No mass or lymph node enlargement. Poor dentition with multiple caries. Upper chest: Clear lung apices. Review of the MIP images confirms the above findings CTA HEAD FINDINGS Anterior circulation: The internal carotid arteries are widely patent from skullbase to carotid termini. The ACAs and MCAs are patent without  evidence of proximal branch occlusion or significant proximal stenosis. Diffuse branch vessel irregularity is attributed to technique. No aneurysm. Posterior circulation: The intracranial vertebral arteries are widely patent to the basilar. Right PICA, left AICA, and bilateral SCA origins are patent. The basilar artery is widely patent. There are patent posterior communicating arteries, right larger than left. The right P1 segment is hypoplastic. PCAs are patent with mild diffuse irregularity attributed to technique. No evidence of flow limiting proximal stenosis. No aneurysm. Venous sinuses: Patent. Anatomic variants: Predominantly fetal contribution to the right PCA. Delayed phase: No abnormal enhancement. Review of the MIP images confirms the above findings IMPRESSION: Negative head and neck CTA. No large vessel occlusion or significant proximal stenosis. Electronically Signed   By: Sebastian AcheAllen  Grady M.D.   On: 02/01/2017 11:49   Mr Brain Wo Contrast  Result Date: 02/01/2017 CLINICAL DATA:  Left-sided weakness and slurred speech. EXAM: MRI HEAD WITHOUT CONTRAST TECHNIQUE: Multiplanar, multiecho pulse sequences of the brain and surrounding structures were obtained without intravenous contrast. COMPARISON:  Head CT earlier today FINDINGS: The patient terminated the examination prematurely. A coronal T2 sequence was not obtained. Brain: There is a 1 cm acute infarct in the posterior limb of the right internal capsule. No intracranial hemorrhage, mass, midline shift, or extra-axial fluid collection is identified. There is focal encephalomalacia involving the posterior body of the corpus callosum bilaterally. T2 shine through is noted in this location on diffusion imaging. There is also a subcentimeter focus of encephalomalacia in the right subinsular white matter. The ventricles and sulci are normal in size. Vascular: Major intracranial vascular flow voids are preserved. Skull and upper cervical spine: Unremarkable  bone marrow signal. Sinuses/Orbits: Unremarkable orbits. Minimal right maxillary sinus mucosal thickening. No significant mastoid fluid. Other: None. IMPRESSION: 1. 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 2. 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 Electronically Signed   By: Sebastian AcheAllen  Grady M.D.   On: 02/01/2017 14:12    Assessment: 41 y.o. male presenting with left sided weakness.  MRI of the brain reviewed and shows an acute right internal capsular infarct.  Likely etiology of infarct is small vessel disease.  CTA was unremarkable.  Echocardiogram pending.  A1c pending, LDL 90.  Patient on no antiplatelet therapy at home.  BP control improved.   Stroke Risk Factors - diabetes mellitus, hypertension and smoking  Plan: 1. PT consult, OT consult, Speech consult 2. Prophylactic therapy-Antiplatelet med: Aspirin - dose 325mg  daily 3. NPO until RN stroke swallow screen 4. Telemetry monitoring 5. Frequent neuro checks 6. Blood sugar management 7. Statin for lipid management with target LDL<70.    Thana FarrLeslie Reynoldo Mainer, MD Neurology [phone] 02/02/2017, 9:20 AM

## 2017-02-02 NOTE — Evaluation (Signed)
Occupational Therapy Evaluation Patient Details Name: Stanley Farley MRN: [id number] DOB: 26-Jun-1976 Today's Date: 02/02/2017    History of Present Illness Pt is a 41 y.o. male who reports going to bed at baseline and woke up with left sided weakness and some nausea and vomiting. Presented to ED same day, 7/29. PMHx significant for insulin dependent diabetes, HTN. Recently taken off of his DM medications due to hypoglycemia.  Reports he has not been checking his blood sugars.  Also notes he stopped taking his blood pressure medication.   Clinical Impression   Pt seen for OT evaluation this date. 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 Pt presents with significant hemiplegia on L side (please see details below) with slight L facial droop and slurred speech, and poor sitting balance. Pt educated in proprioceptive neuromuscular patterns of movement for BUE and pt able to demonstrate technique. Pt will benefit from skilled OT Services to address noted impairments and functional deficits in self care skills and functional mobility in order to maximize return to PLOF and minimize risk of falls/increased caregiver burden. Pt very motivated to participate, however slightly impulsive and has questionable insight into full extent of deficits at this time. Recommend short term in-patient rehab following hospitalization prior to return home.    Follow Up Recommendations  CIR    Equipment Recommendations  Other (comment) (will continue to assess)    Recommendations for Other Services Rehab consult     Precautions / Restrictions Precautions Precautions: Fall Restrictions Weight Bearing Restrictions: No      Mobility Bed Mobility Overal bed mobility: Needs Assistance Bed Mobility: Supine to Sit;Sit to Supine      Supine to sit: HOB elevated;Min assist Sit to supine: Mod assist   General bed mobility comments: VC for safety/to slow down, VC for technique to hook LLE with RLE to help manage into bed  Transfers                 General transfer comment: deferred due to safety, pt very motivated, questionable insight into deficits as pt eager to stand, but has difficulty with static sitting EOB without losing his balance     Balance Overall balance assessment: Needs assistance Sitting-balance support: Single extremity supported;Feet supported Sitting balance-Leahy Scale: Poor Sitting balance - Comments: pt with tendency to lean to L side requiring min guard to min assist to correct static sitting balance                                   ADL either performed or assessed with clinical judgement   ADL Overall ADL's : Needs assistance/impaired Eating/Feeding: Set up;Bed level   Grooming: Bed level;Set up   Upper Body Bathing: Moderate assistance;Bed level   Lower Body Bathing: Moderate assistance;Bed level   Upper Body Dressing : Moderate assistance;Bed level   Lower Body Dressing: Maximal assistance;Bed level     Toilet Transfer Details (indicate cue type and reason): deferred due to safety           General ADL Comments: pt generally mod- max for self care tasks due to significant hemiplegia on L side     Vision Baseline Vision/History: No visual deficits Patient Visual Report: No change from  baseline Vision Assessment?: No apparent visual deficits     Perception     Praxis      Pertinent Vitals/Pain Pain Assessment: No/denies pain     Hand Dominance Right   Extremity/Trunk Assessment Upper Extremity Assessment Upper Extremity Assessment: LUE deficits/detail (RUE WFL) LUE Deficits / Details: intact sensation, 2-/5 shoulder elevation, shoulder horizontal adduction, elbow flexion, grip strength; 1/5 elbow extension, wrist ext/flex, finger ext LUE  Coordination: decreased gross motor;decreased fine motor   Lower Extremity Assessment Lower Extremity Assessment: LLE deficits/detail;Defer to PT evaluation LLE Deficits / Details: intact sensation, grossly 1/5 LLE Coordination: decreased gross motor;decreased fine motor   Cervical / Trunk Assessment Cervical / Trunk Assessment: Normal   Communication Communication Communication: Other (comment) (slightly slurred speech)   Cognition Arousal/Alertness: Awake/alert Behavior During Therapy: Impulsive;WFL for tasks assessed/performed (slightly impulsive, eager to get out of bed) Overall Cognitive Status: Within Functional Limits for tasks assessed                                 General Comments: generally cognition WFL, questionable insight into deficits   General Comments       Exercises Other Exercises Other Exercises: pt/spouse educated in BUE neuro re-ed ROM exercises to perform in bed, pt demonstrated understanding   Shoulder Instructions      Home Living Family/patient expects to be discharged to:: Private residence Living Arrangements: Spouse/significant other;Children Available Help at Discharge: Family;Available 24 hours/day Type of Home: House Home Access: Stairs to enter [employer] CorporationEntrance Stairs-Number of Steps: 2 steps w/ bilateral hand rails for BACK door; front door with 15 steps and bilateral hand rails Entrance Stairs-Rails: Can reach both;Left;Right Home Layout: One level     Bathroom Shower/Tub: Tub/shower unit;Walk-in shower   Bathroom Toilet: Standard     Home Equipment: None          Prior Functioning/Environment Level of Independence: Independent        Comments: Pt independent with ADL, IADL, Optometristmotorcycle mechanic.        OT Problem List: Decreased strength;Decreased range of motion;Decreased safety awareness;Impaired balance (sitting and/or standing);Impaired UE functional use      OT Treatment/Interventions: Self-care/ADL  training;Therapeutic exercise;Therapeutic activities;Neuromuscular education;DME and/or AE instruction;Patient/family education;Balance training    OT Goals(Current goals can be found in the care plan section) Acute Rehab OT Goals Patient Stated Goal: regain motor function on L side OT Goal Formulation: With patient/family Time For Goal Achievement: 02/16/17 Potential to Achieve Goals: Good  OT Frequency: Min 3X/week   Barriers to D/C:            Co-evaluation              AM-PAC PT "6 Clicks" Daily Activity     Outcome Measure Help from another person eating meals?: None Help from another person taking care of personal grooming?: None Help from another person toileting, which includes using toliet, bedpan, or urinal?: A Lot Help from another person bathing (including washing, rinsing, drying)?: A Lot Help from another person to put on and taking off regular upper body clothing?: A Lot Help from another person to put on and taking off regular lower body clothing?: A Lot 6 Click Score: 16   End of Session    Activity Tolerance: Patient tolerated treatment well Patient left: in bed;with call bell/phone within reach;with bed alarm set;with family/visitor present  OT Visit Diagnosis: Other abnormalities of gait and mobility (R26.89);Hemiplegia and hemiparesis Hemiplegia -  Right/Left: Left Hemiplegia - dominant/non-dominant: Non-Dominant Hemiplegia - caused by: Cerebral infarction                Time: [id number] OT Time Calculation (min): 40 min Charges:  OT General Charges $OT Visit: 1 Procedure OT Evaluation $OT Eval Moderate Complexity: 1 Procedure OT Treatments $Neuromuscular Re-education: 8-22 mins G-Codes:     Richrd PrimeJamie Stiller, MPH, MS, OTR/L ascom [phone] 02/02/17, 11:15 AM

## 2017-02-02 NOTE — Clinical Social Work Note (Signed)
CSW consulted for "Home Health Needs, Unemployeed." [facility], pt has a supportive familiy and would like to return home. However, OT is recommend CIR and PT eval is pending. Pt is positive for a CVA. Possible SNF may be needed. CSW will continue to follow for disposition needs.   Dede QuerySarah Nerea Bordenave, MSW, LCSW  Clinical Social Worker  [phone]

## 2017-02-02 NOTE — Progress Notes (Signed)
Briefly spoke with patient and family regarding diabetes.  [facility]  According to patient blood sugars were dropping while patient was on insulin/glucotrol.  Therefore he had stopped taking.  I informed him of his latest A1C of 10.1%.  He seemed surprised.  Family telling patient while I was in there that he needs to listen to doctors and get the care he needs.  Patient states that he is worried about his wife and family and needs to provide for them.  Wife followed me into the hall and states that patient often would not purchase supplies or see MD's due to money and lack of resources.  She states that they have spoke to financial counselor and are planning to file for possible Medicaid/disability.   Will follow.    Thanks, Beryl MeagerJenny Danniell Rotundo, RN, BC-ADM Inpatient Diabetes Coordinator Pager [phone] (8a-5p)

## 2017-02-02 NOTE — Progress Notes (Signed)
A&O pt. Significant weakness on L side and also presenting with slurred speech. Pt c/o insomnia and anxiety, little improvement noted from medications ordered and for only 30 min naps. Yellow arm band is in place. NIHSS = 8. Pt verbalized understanding of diet, diabetes, hypertension, and smoking. He has been educated and can verbalize understanding (teach back) on all 3 topics. Wife is at bedside.

## 2017-02-02 NOTE — Progress Notes (Signed)
Inpatient Diabetes Program Recommendations  AACE/ADA: New Consensus Statement on Inpatient Glycemic Control (2015)  Target Ranges:  Prepandial:   less than 140 mg/dL      Peak postprandial:   less than 180 mg/dL (1-2 hours)      Critically ill patients:  140 - 180 mg/dL   Lab Results  Component Value Date   GLUCAP 216 (H) 02/02/2017   HGBA1C 10.1 (H) 02/01/2017    Review of Glycemic Control:  Results for Clovia CuffMILES, Sal D (MRN [id number]) as of 02/02/2017 10:07  Ref. Range 02/01/2017 10:32 02/01/2017 17:31 02/01/2017 21:30 02/02/2017 07:34  Glucose-Capillary Latest Ref Range: 65 - 99 mg/dL 742271 (H) 595262 (H) 638182 (H) 216 (H)    Diabetes history: Type 2 diabetes Outpatient Diabetes medications: Levemir 30 units daily, Glucotrol XL 10 mg daily-Pt. Not taking due to lows? Current orders for Inpatient glycemic control:  Lantus 15 units daily, Glucotrol XL 10 mg daily, Novolog sensitive tid with meals and HS  Inpatient Diabetes Program Recommendations:    Note that A1C indicates that average blood sugars approximately 243 mg/dL at home prior to admit.  Unclear what made patient stop taking meds.  Will see him today to discuss further. Please d/c Glucotrol while patient is in the hospital.  Consider increasing Lantus to 20 units daily and add Novolog meal coverage 4 units tid with meals (hold if patient eats less than 50%).   Thanks, Beryl MeagerJenny Tylasia Fletchall, RN, BC-ADM Inpatient Diabetes Coordinator Pager [phone] (8a-5p)

## 2017-02-02 NOTE — Progress Notes (Signed)
[facility] [place] [facility]   PATIENT NAME: Stanley Farley    MR#:  [id number]  DATE OF BIRTH:  [birth year]  SUBJECTIVE:  CHIEF COMPLAINT:   Chief Complaint  Patient presents with  . Weakness   -  Still has slurred speech and dense hemiplegia of the left side. -Wife at bedside and denies any new complaints  REVIEW OF SYSTEMS:  Review of Systems  Constitutional: Negative for chills, fever and malaise/fatigue.  HENT: Negative for congestion, ear discharge, hearing loss and nosebleeds.   Respiratory: Negative for cough, shortness of breath and wheezing.   Cardiovascular: Negative for chest pain, palpitations and leg swelling.  Gastrointestinal: Negative for abdominal pain, constipation, diarrhea, nausea and vomiting.  Genitourinary: Negative for dysuria and urgency.  Musculoskeletal: Negative for myalgias.  Neurological: Positive for speech change and focal weakness. Negative for dizziness, seizures and headaches.  Psychiatric/Behavioral: Negative for depression.    DRUG ALLERGIES:  No Known Allergies  VITALS:  Blood pressure (!) 145/69, pulse 67, temperature 98.7 F (37.1 C), temperature source Oral, resp. rate 11, height 6\' 2"  (1.88 m), weight 102.1 kg (225 lb), SpO2 98 %.  PHYSICAL EXAMINATION:  Physical Exam  GENERAL:  41 y.o.-year-old patient lying in the bed with no acute distress.  EYES: Pupils equal, round, reactive to light and accommodation. No scleral icterus. Extraocular muscles intact.  HEENT: Head atraumatic, normocephalic. Oropharynx and nasopharynx clear.  NECK:  Supple, no jugular venous distention. No thyroid enlargement, no tenderness.  LUNGS: Normal breath sounds bilaterally, no wheezing, rales,rhonchi or crepitation. No use of accessory muscles of respiration.  CARDIOVASCULAR: S1, S2 normal. No murmurs, rubs, or gallops.  ABDOMEN: Soft, nontender, nondistended. Bowel sounds present. No organomegaly or mass.  EXTREMITIES: No  pedal edema, cyanosis, or clubbing.  NEUROLOGIC: Left facial droop noted, otherwise cranial nerves intact.  Muscle strength 5/5 both upper and lower right side extremities, left sided strength is 1/5. Sensation intact. Gait not checked.  PSYCHIATRIC: The patient is alert and oriented x 3.  SKIN: No obvious rash, lesion, or ulcer.    LABORATORY PANEL:   CBC  Recent Labs Lab 02/01/17 1032  WBC 7.2  HGB 13.1  HCT 37.6*  PLT 208   ------------------------------------------------------------------------------------------------------------------  Chemistries   Recent Labs Lab 02/01/17 1032  NA 137  K 4.2  CL 102  CO2 28  GLUCOSE 310*  BUN 22*  CREATININE 1.36*  CALCIUM 9.0  AST 22  ALT 14*  ALKPHOS 63  BILITOT 0.5   ------------------------------------------------------------------------------------------------------------------  Cardiac Enzymes  Recent Labs Lab 02/01/17 1032  TROPONINI <0.03   ------------------------------------------------------------------------------------------------------------------  RADIOLOGY:  Ct Angio Head W Or Wo Contrast  Result Date: 02/01/2017 CLINICAL DATA:  Left-sided weakness and slurred speech. EXAM: CT ANGIOGRAPHY HEAD AND NECK TECHNIQUE: Multidetector CT imaging of the head and neck was performed using the standard protocol during bolus administration of intravenous contrast. Multiplanar CT image reconstructions and MIPs were obtained to evaluate the vascular anatomy. Carotid stenosis measurements (when applicable) are obtained utilizing NASCET criteria, using the distal internal carotid diameter as the denominator. CONTRAST:  75 mL Isovue 370 COMPARISON:  None. FINDINGS: CTA NECK FINDINGS Aortic arch: Standard 3 vessel aortic arch. Brachiocephalic and subclavian arteries are widely patent. Right carotid system: Patent without evidence of stenosis, dissection, or significant atherosclerosis. Left carotid system: Patent without  evidence of stenosis, dissection, or significant atherosclerosis. Vertebral arteries: Patent and codominant without evidence of stenosis, dissection, or significant atherosclerosis. Skeleton: Mild disc space narrowing and  moderate degenerative endplate spurring at C5-6. Other neck: No mass or lymph node enlargement. Poor dentition with multiple caries. Upper chest: Clear lung apices. Review of the MIP images confirms the above findings CTA HEAD FINDINGS Anterior circulation: The internal carotid arteries are widely patent from skullbase to carotid termini. The ACAs and MCAs are patent without evidence of proximal branch occlusion or significant proximal stenosis. Diffuse branch vessel irregularity is attributed to technique. No aneurysm. Posterior circulation: The intracranial vertebral arteries are widely patent to the basilar. Right PICA, left AICA, and bilateral SCA origins are patent. The basilar artery is widely patent. There are patent posterior communicating arteries, right larger than left. The right P1 segment is hypoplastic. PCAs are patent with mild diffuse irregularity attributed to technique. No evidence of flow limiting proximal stenosis. No aneurysm. Venous sinuses: Patent. Anatomic variants: Predominantly fetal contribution to the right PCA. Delayed phase: No abnormal enhancement. Review of the MIP images confirms the above findings IMPRESSION: Negative head and neck CTA. No large vessel occlusion or significant proximal stenosis. Electronically Signed   By: Sebastian AcheAllen  Grady M.D.   On: 02/01/2017 11:49   Ct Head Wo Contrast  Result Date: 02/01/2017 CLINICAL DATA:  Pt reports woke up this morning with left side weakness, slurred speech reports went to bed last night around midnight. Pt denies any pain. Pt talks in complete sentences EXAM: CT HEAD WITHOUT CONTRAST TECHNIQUE: Contiguous axial images were obtained from the base of the skull through the vertex without intravenous contrast. COMPARISON:   01/09/1998 by report only FINDINGS: Brain: No evidence of acute infarction, hemorrhage, hydrocephalus, extra-axial collection or mass lesion/mass effect. Vascular: No hyperdense vessel or unexpected calcification. Skull: Normal. Negative for fracture or focal lesion. Sinuses/Orbits: No acute finding. Other: None. IMPRESSION: 1. Negative for bleed or other acute intracranial process. Electronically Signed   By: Corlis Leak  Hassell M.D.   On: 02/01/2017 10:59   Ct Angio Neck W And/or Wo Contrast  Result Date: 02/01/2017 CLINICAL DATA:  Left-sided weakness and slurred speech. EXAM: CT ANGIOGRAPHY HEAD AND NECK TECHNIQUE: Multidetector CT imaging of the head and neck was performed using the standard protocol during bolus administration of intravenous contrast. Multiplanar CT image reconstructions and MIPs were obtained to evaluate the vascular anatomy. Carotid stenosis measurements (when applicable) are obtained utilizing NASCET criteria, using the distal internal carotid diameter as the denominator. CONTRAST:  75 mL Isovue 370 COMPARISON:  None. FINDINGS: CTA NECK FINDINGS Aortic arch: Standard 3 vessel aortic arch. Brachiocephalic and subclavian arteries are widely patent. Right carotid system: Patent without evidence of stenosis, dissection, or significant atherosclerosis. Left carotid system: Patent without evidence of stenosis, dissection, or significant atherosclerosis. Vertebral arteries: Patent and codominant without evidence of stenosis, dissection, or significant atherosclerosis. Skeleton: Mild disc space narrowing and moderate degenerative endplate spurring at C5-6. Other neck: No mass or lymph node enlargement. Poor dentition with multiple caries. Upper chest: Clear lung apices. Review of the MIP images confirms the above findings CTA HEAD FINDINGS Anterior circulation: The internal carotid arteries are widely patent from skullbase to carotid termini. The ACAs and MCAs are patent without evidence of proximal  branch occlusion or significant proximal stenosis. Diffuse branch vessel irregularity is attributed to technique. No aneurysm. Posterior circulation: The intracranial vertebral arteries are widely patent to the basilar. Right PICA, left AICA, and bilateral SCA origins are patent. The basilar artery is widely patent. There are patent posterior communicating arteries, right larger than left. The right P1 segment is hypoplastic. PCAs are patent with  mild diffuse irregularity attributed to technique. No evidence of flow limiting proximal stenosis. No aneurysm. Venous sinuses: Patent. Anatomic variants: Predominantly fetal contribution to the right PCA. Delayed phase: No abnormal enhancement. Review of the MIP images confirms the above findings IMPRESSION: Negative head and neck CTA. No large vessel occlusion or significant proximal stenosis. Electronically Signed   By: Sebastian AcheAllen  Grady M.D.   On: 02/01/2017 11:49   Mr Brain Wo Contrast  Result Date: 02/01/2017 CLINICAL DATA:  Left-sided weakness and slurred speech. EXAM: MRI HEAD WITHOUT CONTRAST TECHNIQUE: Multiplanar, multiecho pulse sequences of the brain and surrounding structures were obtained without intravenous contrast. COMPARISON:  Head CT earlier today FINDINGS: The patient terminated the examination prematurely. A coronal T2 sequence was not obtained. Brain: There is a 1 cm acute infarct in the posterior limb of the right internal capsule. No intracranial hemorrhage, mass, midline shift, or extra-axial fluid collection is identified. There is focal encephalomalacia involving the posterior body of the corpus callosum bilaterally. T2 shine through is noted in this location on diffusion imaging. There is also a subcentimeter focus of encephalomalacia in the right subinsular white matter. The ventricles and sulci are normal in size. Vascular: Major intracranial vascular flow voids are preserved. Skull and upper cervical spine: Unremarkable bone marrow signal.  Sinuses/Orbits: Unremarkable orbits. Minimal right maxillary sinus mucosal thickening. No significant mastoid fluid. Other: None. IMPRESSION: 1. 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 2. 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 Electronically Signed   By: Sebastian AcheAllen  Grady M.D.   On: 02/01/2017 14:12    EKG:   Orders placed or performed during the hospital encounter of 02/01/17  . ED EKG  . ED EKG    ASSESSMENT AND PLAN:   41 year old African-American male with past medical history significant for insulin-dependent diabetes presents to the hospital secondary to left- sided weakness  #1 acute CVA-presenting with slurred speech and dense hemiplegia on the left side. -Family history of stroke present. Patient has risk factors including diabetes and hypertension. -CT angiogram was normal for neck and the head. 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 Continue neuro checks. -Started on aspirin and statin. -Blood pressure control and diabetes control -PT, OT and speech therapy consult requested. -Might benefit from going to an acute inpatient rehabilitation  #2 diabetes mellitus-on Lantus, sliding scale and glipizide A1c is pending.  #3 hypertension-on lisinopril  #4 tobacco use disorder-strongly counseled against smoking. On nicotine patch  #5 DVT prophylaxis-on Lovenox   All the records are reviewed and case discussed with Care Management/Social Workerr. Management plans discussed with the patient, family and they are in agreement.  CODE STATUS: Full code  TOTAL TIME TAKING CARE OF THIS PATIENT: 38 minutes.   POSSIBLE D/C IN 1-2 DAYS, DEPENDING ON CLINICAL CONDITION.   Saysha Menta M.D on 02/02/2017 at 9:17 AM  Between 7am to 6pm - Pager - [phone]  After 6pm go to [email or web address] - Social research officer, governmentpassword [email or web address]  [facility]  Office   [phone]  CC: Primary care physician; Emogene MorganAycock, Ngwe A, MD

## 2017-02-02 NOTE — Evaluation (Addendum)
Speech Language Pathology Evaluation Patient Details Name: Stanley Farley MRN: [id number] DOB: 02-Nov-1975 Today's Date: 02/02/2017 Time: 1350-1450 SLP Time Calculation (min) (ACUTE ONLY): 60 min  Problem List:  Patient Active Problem List   Diagnosis Date Noted  . Acute CVA (cerebrovascular accident) (HCC) 02/01/2017   Past Medical History:  Past Medical History:  Diagnosis Date  . Diabetes mellitus without complication (HCC)   . Hypertension    Past Surgical History: History reviewed. No pertinent surgical history. HPI:   Pt is a 41 y.o. male with a past medical history of diabetes, hypertension, presents to the emergency department for left arm and leg weakness. According to the patient since awakening in the morning, he has been experiencing left arm and leg weakness. No history of CVA in the past. Patient also nauseated with vomiting in the emergency department and hypertensive, 175/86. Patient is prescribed hypertensive medications which he states he has been taking.Patient denies any headache or confusion; able to converse in conversation w/ others w/ min slurred speech, per report. Pt denied any difficulty swallowing to NSG.   Assessment / Plan / Recommendation Clinical Impression  Pt appears to present w/ mild-moderate Dysarthria during conversation/speech w/ others. During Motor Speech Tasks, pt exhibited decreased tone and ROM/strength in Left labial/facial area; tongue grossly wfl w/ only slight deviation to the Left. Lingual strength/ROM and coordination appeared wfl in individual movements, but in connected or quick, alternating movements, min decreased coordination and timing were noted - this was noted in connected speech at sentence/conversation level more consistently. Also noted min dysfluency of speech (stuttering) x1. Again, the Dysarthria and Dysfluency of speech were noted more at conversational level when pt was excited/eager to talk rapidly while engaged in  conversation w/ others. When strategies of of slowing his rate and over-articulating were implemented, pt was able to significantly increase his articulation and intelligibility of speech. These strategies were discussed, modeled, and practiced w/ pt and wife for homework/use.  Pt did not exhibit Receptive or Expressive Aphasia; Cognitive skills appear at baseline w/ no gross deficits noted during screening. Recommend further formal assessment if Cognitive deficits are noted in home setting.  Pt would benefit from skilled ST f/u for ongoing education and feedback on use of Dysarthria strategies and OMEs to improve overall intelligibility of verbal communication in home and work settings.      SLP Assessment  SLP Recommendation/Assessment: All further Speech Lanaguage Pathology  needs can be addressed in the next venue of care SLP Visit Diagnosis: Dysarthria and anarthria (R47.1)    Follow Up Recommendations  Inpatient Rehab    Frequency and Duration min 3x week  2 weeks      SLP Evaluation Cognition  Overall Cognitive Status: Within Functional Limits for tasks assessed Arousal/Alertness: Awake/alert Orientation Level: Oriented X4 Attention: Focused;Sustained Focused Attention: Appears intact Sustained Attention: Appears intact Memory: Appears intact Awareness: Appears intact Problem Solving: Appears intact Executive Function: Reasoning;Sequencing;Decision Making Reasoning: Appears intact Sequencing: Appears intact Decision Making: Appears intact (grossly) Behaviors: Restless;Impulsive (min) Safety/Judgment: Appears intact (grossly)       Comprehension  Auditory Comprehension Overall Auditory Comprehension: Appears within functional limits for tasks assessed Yes/No Questions: Within Functional Limits Commands: Within Functional Limits Conversation: Complex Visual Recognition/Discrimination Discrimination: Within Function Limits Reading Comprehension Reading Status: Within  funtional limits    Expression Expression Primary Mode of Expression: Verbal Verbal Expression Overall Verbal Expression: Appears within functional limits for tasks assessed Initiation: No impairment Automatic Speech: Name;Social Response Level of Generative/Spontaneous Verbalization: PublixConversation  Repetition: No impairment Naming: No impairment Pragmatics: No impairment (grossly) Non-Verbal Means of Communication: Not applicable Other Verbal Expression Comments: min-mod dysarthria during verbal expression noted Written Expression Dominant Hand: Right Written Expression: Within Functional Limits   Oral / Motor  Oral Motor/Sensory Function Overall Oral Motor/Sensory Function: Mild impairment (-Moderate impairment ) Facial ROM: Reduced left Facial Symmetry: Abnormal symmetry left Facial Strength: Reduced left Facial Sensation: Reduced left (min) Lingual ROM: Within Functional Limits Lingual Symmetry: Abnormal symmetry left (slight) Lingual Strength: Reduced (slight) Lingual Sensation: Within Functional Limits Velum: Within Functional Limits Mandible: Within Functional Limits Motor Speech Overall Motor Speech: Impaired Respiration: Within functional limits Phonation: Normal Resonance: Within functional limits Articulation: Impaired Level of Impairment: Sentence Intelligibility: Intelligibility reduced Phrase: 75-100% accurate Sentence: 75-100% accurate Conversation: 75-100% accurate Motor Planning:  (grossly wfl - when pt took his time) Motor Speech Errors:  (dysfluency x1 when he became excited) Interfering Components:  (n/a) Effective Techniques: Slow rate;Increased vocal intensity;Over-articulate   GO                     Jerilynn SomKatherine Watson, MS, CCC-SLP Watson,Katherine 02/02/2017, 5:12 PM

## 2017-02-02 NOTE — Evaluation (Signed)
Physical Therapy Evaluation Patient Details Name: Stanley Farley MRN: [id number] DOB: 07/14/1975 Today's Date: 02/02/2017   History of Present Illness  Pt is a 41 y.o. male who reports going to bed at baseline and woke up with left sided weakness, slurred speech, and some nausea and vomiting. Presented to ED same day, 7/29.  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 PMHx significant for insulin dependent diabetes, HTN. Recently taken off of his DM medications due to hypoglycemia.  Reports he has not been checking his blood sugars.  Also notes he stopped taking his blood pressure medication.  Clinical Impression  Prior to hospital admission, pt was independent and very active (riding motorcycles, playing basketball, etc).  Pt lives with his wife and 3 children (ages 3621, 1018, and 41 y.o.) in 1 level home with stairs to enter.  Currently pt is min to mod assist with supine to sit, mod assist x2 to stand, and mod to max assist x2 to take a few steps bed to recliner (L knee blocked d/t buckling).  Pt demonstrates significant L UE and L LE weakness, mild pushing to L side, and impaired balance.  Mild impulsivity and some decreased insight into deficits noted (although pt appeared more aware of deficits during session's activities).  Pt very motivated to participate in physical therapy.  Pt would benefit from skilled PT to address noted impairments and functional limitations (see below for any additional details).  Upon hospital discharge, recommend pt discharge to inpatient rehab.    Follow Up Recommendations CIR    Equipment Recommendations   (TBD at next venue of care)    Recommendations for Other Services Rehab consult     Precautions / Restrictions Precautions Precautions: Fall Restrictions Weight Bearing Restrictions: No      Mobility  Bed Mobility Overal bed mobility: Needs  Assistance Bed Mobility: Supine to Sit     Supine to sit: HOB elevated;Min assist;Mod assist   General bed mobility comments: vc's to use R LE to scoot hips to L towards edge of bed (close SBA for safety); vc's to use R UE to assist for bed mobility; mod assist for L LE and for trunk supine to sit  Transfers Overall transfer level: Needs assistance   Transfers: Sit to/from Stand Sit to Stand: Mod assist;+2 physical assistance         General transfer comment: assist for balance and to block L knee (to prevent buckling) sit to/from stand; R hand hold assist with standing; vc's to shift weight to R for upright posture (pushing some to L)  Ambulation/Gait Ambulation/Gait assistance: Mod assist;Max assist;+2 physical assistance Ambulation Distance (Feet): 3 Feet Assistive device: None   Gait velocity: decreased   General Gait Details: pt able to side step to L along edge of bed and turn to position self in front of chair (ambulating a few feet) with mod to max assist x2; L knee blocked to prevent buckling; vc's and assist for upright posture (d/t some pushing to L; also intermittent flexed posture but anticipate d/t pt looking down to take steps); pt able to move L LE on own short distance laterally and forward to take steps; vc's for L quad set (with L knee blocked) before taking a step with R LE  Stairs Stairs:  (not appropriate at this time)          Wheelchair Mobility    Modified Rankin (  Stroke Patients Only)       Balance Overall balance assessment: Needs assistance Sitting-balance support: Single extremity supported;Feet supported Sitting balance-Leahy Scale: Poor Sitting balance - Comments: pt pushing to L side requiring close SBA (with pt's R UE holding onto bedrail for support) and mod to max assist to maintain upright with no UE support (loss of balance/lean to L) Postural control: Left lateral lean Standing balance support: Single extremity supported Standing  balance-Leahy Scale: Zero Standing balance comment: L knee blocked and assist to shift weight R to maintain upright; vc's for upright posture                             Pertinent Vitals/Pain Pain Assessment: No/denies pain  Vitals (HR and O2 on room air) stable and WFL throughout treatment session.    Home Living Family/patient expects to be discharged to:: Private residence Living Arrangements: Spouse/significant other;Children (3 sons (7021, 2418, and 41 y.o.)) Available Help at Discharge: Family;Available 24 hours/day Type of Home: House Home Access: Stairs to enter Entrance Stairs-Rails: Can reach both;Left;Right Entrance Stairs-Number of Steps: 2 steps w/ bilateral hand rails for BACK door; front door with 15 steps and bilateral hand rails Home Layout: One level Home Equipment: None      Prior Function Level of Independence: Independent         Comments: Pt independent with ADL, IADL, Optometristmotorcycle mechanic.  Currently not working.     Hand Dominance   Dominant Hand: Right    Extremity/Trunk Assessment   Upper Extremity Assessment Upper Extremity Assessment: Defer to OT evaluation (below UE information per OT evaluation) LUE Deficits / Details: intact sensation, 2-/5 shoulder elevation, shoulder horizontal adduction, elbow flexion, grip strength; 1/5 elbow extension, wrist ext/flex, finger ext LUE Coordination: decreased gross motor;decreased fine motor    Lower Extremity Assessment Lower Extremity Assessment:  (B LE light touch, proprioception, and tone intact; R LE WNL) LLE Deficits / Details: 0/5 DF; 1/5 PF; knee flexion 2-/5; knee extension 2+/5; hip flexion 2-/5.  Increased time to "think" about muscle activation and initiate activation in general L LE LLE Coordination: decreased gross motor;decreased fine motor    Cervical / Trunk Assessment Cervical / Trunk Assessment: Normal  Communication   Communication: Other (comment) (slightly slurred speech)   Cognition Arousal/Alertness: Awake/alert Behavior During Therapy: Impulsive (Impulsivity noted) Overall Cognitive Status: Within Functional Limits for tasks assessed                                 General Comments: Decreased insight into deficits      General Comments General comments (skin integrity, edema, etc.): Pt's wife present during session.  Nursing cleared pt for participation in physical therapy.  Pt agreeable to PT session.    Exercises x10 standing L knee mild flexion with subsequent quad set cues; mod assist x2; vc's for technique; L knee blocked; assist and vc's for upright (d/t L lean)   Assessment/Plan    PT Assessment Patient needs continued PT services  PT Problem List Decreased strength;Decreased balance;Decreased mobility;Decreased coordination;Decreased cognition;Decreased knowledge of use of DME;Decreased safety awareness;Decreased knowledge of precautions       PT Treatment Interventions DME instruction;Gait training;Stair training;Functional mobility training;Therapeutic activities;Therapeutic exercise;Balance training;Neuromuscular re-education;Patient/family education    PT Goals (Current goals can be found in the Care Plan section)  Acute Rehab PT Goals Patient Stated Goal: regain motor function on  L side PT Goal Formulation: With patient Time For Goal Achievement: 02/16/17 Potential to Achieve Goals: Fair    Frequency 7X/week   Barriers to discharge Decreased caregiver support      Co-evaluation               AM-PAC PT "6 Clicks" Daily Activity  Outcome Measure Difficulty turning over in bed (including adjusting bedclothes, sheets and blankets)?: A Lot Difficulty moving from lying on back to sitting on the side of the bed? : Total Difficulty sitting down on and standing up from a chair with arms (e.g., wheelchair, bedside commode, etc,.)?: Total Help needed moving to and from a bed to chair (including a wheelchair)?:  Total Help needed walking in hospital room?: Total Help needed climbing 3-5 steps with a railing? : Total 6 Click Score: 7    End of Session Equipment Utilized During Treatment: Gait belt Activity Tolerance: Patient tolerated treatment well Patient left: in chair;with call bell/phone within reach;with chair alarm set;with nursing/sitter in room;with family/visitor present Nurse Communication: Mobility status;Precautions PT Visit Diagnosis: Other abnormalities of gait and mobility (R26.89);Hemiplegia and hemiparesis Hemiplegia - Right/Left: Left Hemiplegia - dominant/non-dominant: Non-dominant Hemiplegia - caused by: Cerebral infarction    Time: 1324-4010 PT Time Calculation (min) (ACUTE ONLY): 40 min   Charges:   PT Evaluation $PT Eval Low Complexity: 1 Low PT Treatments $Therapeutic Activity: 8-22 mins   PT G Codes:   PT G-Codes **NOT FOR INPATIENT CLASS** Functional Assessment Tool Used: AM-PAC 6 Clicks Basic Mobility Functional Limitation: Mobility: Walking and moving around Mobility: Walking and Moving Around Current Status (U7253): At least 80 percent but less than 100 percent impaired, limited or restricted Mobility: Walking and Moving Around Goal Status [phone]): At least 1 percent but less than 20 percent impaired, limited or restricted    Hendricks Limes, PT 02/02/17, 2:49 PM [phone]

## 2017-02-02 NOTE — Progress Notes (Signed)
*  PRELIMINARY RESULTS* Echocardiogram 2D Echocardiogram has been performed.  Cristela BlueHege, Jeffrie Stander 02/02/2017, 3:14 PM

## 2017-02-02 NOTE — Progress Notes (Signed)
Inpatient Rehabilitation  Note PT evaluation also recommending IP Rehab for post acute therapies.  Placed a consult order and plan to follow up with patient to discuss team's recommendation for IP Rehab.  Carleene OverlieUpdated Brenda, RN CM.  Please call with questions.   Charlane FerrettiMelissa Dosha Broshears, M.A., CCC/SLP Admission Coordinator  [facility]  Cell [phone]

## 2017-02-02 NOTE — Care Management Note (Signed)
Case Management Note  Patient Details  Name: Stanley Farley MRN: [id number] Date of Birth: Sep 08, 1975  Subjective/Objective:      Admitted to [facility] with the diagnosis of acute CVA. Lives with wife, Lady GaryKatrina [phone]). [facility] about a month ago. [facility]. No home Health. No skilled facility. No home oxygen, Takes care of all basic activities of daily living himself, drives. No falls. Good appetite. No insurance. Did work until 2 weeks ago.                Action/Plan: Occupational therapy evaluation completed. Recommending Inpatient Acute Rehabilitation. Spoke with Mr & Ms Marvis MoellerMiles at the bedside. States there would be support in the home following rehabilitation and [place] [facility]   Expected Discharge Date:  02/03/17               Expected Discharge Plan:     In-House Referral:     Discharge planning Services     Post Acute Care Choice:    Choice offered to:     DME Arranged:    DME Agency:     HH Arranged:    HH Agency:     Status of Service:     If discussed at [employer] Meetings, dates discussed:    Additional Comments:  Gwenette GreetBrenda S Jorden Minchey, RN MSN CCM Care Management [phone] 02/02/2017, 11:08 AM

## 2017-02-02 NOTE — Progress Notes (Signed)
Inpatient Rehabilitation  Per OT request, patient was screened by Fae PippinMelissa Nymir Ringler for appropriateness for an Inpatient Acute Rehab consult.  At this time I await PT evaluation and recommendations.  Plan to follow up with the team following PT.  Please call with questions.  Charlane FerrettiMelissa Erma Raiche, M.A., CCC/SLP Admission Coordinator  [facility]  Cell [phone]

## 2017-02-03 LAB — SEDIMENTATION RATE: SED RATE: 41 mm/h — AB (ref 0–15)

## 2017-02-03 LAB — ECHOCARDIOGRAM COMPLETE
Height: 74 in
Weight: 3600 oz

## 2017-02-03 LAB — GLUCOSE, CAPILLARY
GLUCOSE-CAPILLARY: 128 mg/dL — AB (ref 65–99)
GLUCOSE-CAPILLARY: 166 mg/dL — AB (ref 65–99)
Glucose-Capillary: 118 mg/dL — ABNORMAL HIGH (ref 65–99)
Glucose-Capillary: 168 mg/dL — ABNORMAL HIGH (ref 65–99)

## 2017-02-03 LAB — HEMOGLOBIN A1C
Hgb A1c MFr Bld: 9.9 % — ABNORMAL HIGH (ref 4.8–5.6)
MEAN PLASMA GLUCOSE: 237 mg/dL

## 2017-02-03 LAB — ANTITHROMBIN III: AntiThromb III Func: 102 % (ref 75–120)

## 2017-02-03 LAB — HIV ANTIBODY (ROUTINE TESTING W REFLEX): HIV SCREEN 4TH GENERATION: NONREACTIVE

## 2017-02-03 NOTE — Progress Notes (Signed)
Physical Therapy Treatment Patient Details Name: Stanley Farley MRN: [id number] DOB: 1975-11-10 Today's Date: 02/03/2017    History of Present Illness Pt is a 41 y.o. male who reports going to bed at baseline and woke up with left sided weakness, slurred speech, and some nausea and vomiting. Presented to ED same day, 7/29.  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 PMHx significant for insulin dependent diabetes, HTN. Recently taken off of his DM medications due to hypoglycemia.  Reports he has not been checking his blood sugars.  Also notes he stopped taking his blood pressure medication.    PT Comments    Pt in bed, ready for session.  Pt is very motivated to increase overall strength and independence and works hard during therapy session.  Needs encouragement to take rest breaks to prevent muscle fatigue and to maximize therapy time.  Session focused on supine exercises for LLE strength as described below.  While he has no ankle/toe AROM noted, he has a good hip extension in supine and a strong bridge even single leg on left.  He is able to complete a SAQ on left with good contraction but fatigues quickly and requires rest breaks.  He remains with global deficits on L side.  Poor sitting balance and requires min assist to prevent falling to left.  He is able to stand with mod a x 2 and does not buckle on LLE while standing.  He requires assist to move LLE to step and is unable to support weight on LLE without blocking.  He lists to left and requires assist to prevent falling.  +2 assist stressed to nursing tech staff.  Wife in for session and was educated along with pt on exercise techniques.   Follow Up Recommendations  CIR     Equipment Recommendations       Recommendations for Other Services Rehab consult     Precautions / Restrictions Precautions Precautions:  Fall Restrictions Weight Bearing Restrictions: No    Mobility  Bed Mobility Overal bed mobility: Needs Assistance Bed Mobility: Supine to Sit     Supine to sit: HOB elevated;Mod assist     General bed mobility comments: verbal cues for sequening.  Tries to sit upright in bed to longsit which is unsuccessful.  Education for technique and hand placements  Transfers Overall transfer level: Needs assistance   Transfers: Sit to/from Stand Sit to Stand: Mod assist;+2 physical assistance         General transfer comment: generally unsteady with poor balance.  no knee buckling with standing but does with attempts to step.  Ambulation/Gait     Assistive device: None       General Gait Details: pt able to step/pivot to chair but generally poor quality and balance.  heavy blocking on left knee to prevent buckling and falling to left.   Stairs            Wheelchair Mobility    Modified Rankin (Stroke Patients Only)       Balance Overall balance assessment: Needs assistance Sitting-balance support: Single extremity supported;Feet supported Sitting balance-Leahy Scale: Poor Sitting balance - Comments: needs assist to prevent falling to left Postural control: Left lateral lean Standing balance support: Single extremity supported Standing balance-Leahy Scale: Zero Standing balance comment: L knee blocked and assist to shift weight R to maintain upright; vc's for upright posture  Cognition Arousal/Alertness: Awake/alert Behavior During Therapy: WFL for tasks assessed/performed Overall Cognitive Status: Within Functional Limits for tasks assessed                                        Exercises Other Exercises Other Exercises: session focused on LE strengthening exercises in supine.  double and single leg bridges, SLR, Ab/adduction, pillow squeeze, heel slides, interanal and external rotation control in hooklying  position, sitting exercises for LAQ , squeezes, ab/adduction.  Extensive education for pt and wife on exercise techniques.    General Comments        Pertinent Vitals/Pain Pain Assessment: No/denies pain    Home Living                      Prior Function            PT Goals (current goals can now be found in the care plan section) Progress towards PT goals: Progressing toward goals    Frequency    7X/week      PT Plan Current plan remains appropriate    Co-evaluation              AM-PAC PT "6 Clicks" Daily Activity  Outcome Measure  Difficulty turning over in bed (including adjusting bedclothes, sheets and blankets)?: A Lot Difficulty moving from lying on back to sitting on the side of the bed? : Total Difficulty sitting down on and standing up from a chair with arms (e.g., wheelchair, bedside commode, etc,.)?: Total Help needed moving to and from a bed to chair (including a wheelchair)?: Total Help needed walking in hospital room?: Total Help needed climbing 3-5 steps with a railing? : Total 6 Click Score: 7    End of Session Equipment Utilized During Treatment: Gait belt Activity Tolerance: Patient tolerated treatment well Patient left: in chair;with call bell/phone within reach;with chair alarm set;with family/visitor present Nurse Communication: Mobility status;Precautions Hemiplegia - Right/Left: Left Hemiplegia - dominant/non-dominant: Non-dominant Hemiplegia - caused by: Cerebral infarction     Time: [id number] PT Time Calculation (min) (ACUTE ONLY): 39 min  Charges:  $Therapeutic Exercise: 23-37 mins $Therapeutic Activity: 8-22 mins                    G Codes:       Danielle DessSarah Shadae Reino, PTA 02/03/17, 12:06 PM

## 2017-02-03 NOTE — Progress Notes (Signed)
OT Cancellation Note  Patient Details Name: Stanley Farley D Abercrombie MRN: [id number] DOB: 10-20-1975   Cancelled Treatment:    Reason Eval/Treat Not Completed: Fatigue/lethargy limiting ability to participate. On 2nd attempt to see, pt and spouse sleeping soundly in room. Pt requesting OT let him sleep, agreeable to OT in the am. Pt/spouse very excited, were told they would be going to CIR tomorrow.   Richrd PrimeJamie Stiller, MPH, MS, OTR/L ascom [phone] 02/03/17, 3:06 PM

## 2017-02-03 NOTE — Progress Notes (Signed)
OT Cancellation Note  Patient Details Name: Stanley Farley MRN: [id number] DOB: Oct 15, 1975   Cancelled Treatment:    Reason Eval/Treat Not Completed: Patient declined, no reason specified. Upon entry, pt starting to eat lunch. Pt agreeable to OT coming back at later time to allow him to finish eating. Will re-attempt later this date.  Richrd PrimeJamie Stiller, MPH, MS, OTR/L ascom [phone] 02/03/17, 2:16 PM

## 2017-02-03 NOTE — PMR Pre-admission (Signed)
[facility] PMR Admission Coordinator Pre-Admission Assessment  Patient: Stanley Farley is an 41 y.o., male MRN: [id number] DOB: Aug 27, 1975 Height: _0  (188 cm) Weight: 102.1 kg (225 lb)  Insurance Information HMO:     PPO:      PCP:      IPA:      80/20:      OTHER:  PRIMARY: uninsured       Policy#:       Subscriber:  CM Name:       Phone#:      Fax#:  Pre-Cert#:       Employer:  Benefits:  Phone #:      Name:  Eff. 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 Per patient report and chart review patient lives with spouse and 3 children ages 2, 75, & 61. 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 He lives in a one level home with 2 steps to enter. Presented to [facility] 02/01/2017 with left-sided weakness, slurred speech as well as intermittent nausea and vomiting. Cranial CT scan negative. CTA of head and neck showed no large vessel occlusion or stenosis. 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 Patient did not receive TPA. Echocardiogram with  ejection fraction of 55%. Systolic function normal and no intracardiac source of emboli. Presently maintained on aspirin for CVA prophylaxis. Subcutaneous Lovenox for DVT prophylaxis. Nicoderm patch initiated for tobacco abuse. Tolerating a regular consistency diet. Physical and occupational therapy evaluations completed with recommendations of physical medicine rehabilitation consult. Patient was admitted for a comprehensive rehabilitation program 02/04/17.  Patient's medical record from [facility] has been reviewed by the rehabilitation admission coordinator and physician.  NIH Stroke scale: 10  Past Medical History  Past Medical History:  Diagnosis Date  . Diabetes mellitus without complication ([place])   . Hypertension     Family History   family history includes Diabetes in his mother; Heart attack in his father; Hypertension in his father.  Prior Rehab/Hospitalizations Has the patient had major surgery during 100 days prior to admission? No    Current Medications Refer to [facility]   Patients Current Diet:  Heart Healthy & Carbohydrate Modified diet restrictions   Precautions / Restrictions Precautions Precautions: Fall Restrictions Weight Bearing Restrictions: No   Has the patient had 2 or more falls or a fall with injury in the past  year?No  Prior Activity Level Community (5-7x/wk): Prior to admission patient was fully independent and active.  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    Prior Functional Level Self Care: Did the patient need help bathing, dressing, using the toilet or eating? Independent  Indoor Mobility: Did the patient need assistance with walking from room to room (with or without device)? Independent  Stairs: Did the patient need assistance with internal or external stairs (with or without device)? Independent  Functional Cognition: Did the patient need help planning regular tasks such as shopping or remembering to take  medications? Independent  Home Assistive Devices / Equipment Home Assistive Devices/Equipment: None Home Equipment: None  Prior Device Use: Indicate devices/aids used by the patient prior to current illness, exacerbation or injury? None of the above   Prior Functional Level Current Functional Level  Bed Mobility  Independent   Mod A with elevated HOB    Transfers  Independent   Mod +2 assist for sit to stands    Mobility - Walk/Wheelchair  Independent   Mod-Max A +2 for 3 feet   Upper Body Dressing  Independent   Mod A bed level    Lower Body Dressing  Independent   Max A bed level    Grooming  Independent   Supervision set-up bed level    Eating/Drinking  Independent   Supervision set-up bed level    Toilet Transfer  Independent   TBD, deferred due to safety    Bladder Continence   Independent, Yes   Continent    Bowel Management  Independent, Continent yes   Continent    Stair Climbing  Independent    TBD, deferred due to safety   Communication  Independent    Min A   Memory  Independent    Supervision    Cooking/Meal Prep  Passenger transport manager  Independent     Driving  Independent       Special needs/care consideration BiPAP/CPAP: No CPM: No Continuous Drip IV: No Dialysis: No         Life Vest: No Oxygen: No Special Bed: No Trach Size: No Wound Vac (area): No       Skin: WDL                           Bowel mgmt: Continent, last BM 02/01/17 Bladder mgmt: Continent  Diabetic mgmt: HgbA1c 9.9 non-compliant with meds prior to admission due to lack of insurance and finical resources   Previous Home Environment Living Arrangements: Spouse/significant other, Children (3 sons (91, 75, and 68 y.o.))  Lives With: Spouse, Family, Son Available Help at Discharge: Family, Available 24 hours/day Type of Home: House Home Layout: One level Home Access: Stairs to enter Entrance Stairs-Rails:  Can reach both, Left, Right Entrance Stairs-Number of Steps: 2 steps w/ bilateral hand rails for BACK door; front door with 15 steps and bilateral hand rails Bathroom Shower/Tub: Tub/shower unit, Multimedia programmer: Standard Home Care Services: No  Discharge Living Setting Plans for Discharge Living Setting: Patient's home, Lives with (comment) (Spouse and 3 children (ages: 44, 59, 31)) Type of Home at Discharge: House Discharge Home Layout: One level Discharge Home Access: Stairs to enter Entrance Stairs-Rails: Right, Left, Can reach both Entrance Stairs-Number of Steps: 2 steps at the back door Discharge Bathroom Shower/Tub: Walk-in shower Discharge Bathroom  Toilet: Standard Discharge Bathroom Accessibility: Yes How Accessible: Accessible via walker Does the patient have any problems obtaining your medications?: Yes (Describe) (patient is uninsured; Development worker, community has met with them)  Social/Family/Support Systems Patient Roles: Spouse, Parent, Other (Comment) (Friend ) Contact Information: Spouse: Dawn Kiper [phone] Anticipated Caregiver: Spouse and family  Anticipated Caregiver's Contact Information: see above  Ability/Limitations of Caregiver: Spouse works, but will coordinate 24/7 assist from family  Caregiver Availability: 24/7 Discharge Plan Discussed with Primary Caregiver: Yes Is Caregiver In Agreement with Plan?: Yes Does Caregiver/Family have Issues with Lodging/Transportation while Pt is in Rehab?: No  Goals/Additional Needs Patient/Family Goal for Rehab: PT/OT Min A-Supervision; SLP Supervision  Expected length of stay: 14-18 days  Cultural Considerations: Patient believes in Jesus and God Dietary Needs: Heart Healthy and Carb. Mod. diet restrictions  Equipment Needs: TBD Special Service Needs: Patient uninsured and has been working with a Development worker, community from [facility] Additional Information: Aleene Davidson  [phone]  Pt/Family Agrees to  Admission and willing to participate: Yes Program Orientation Provided & Reviewed with Pt/Caregiver Including Roles  & Responsibilities: Yes Additional Information Needs: Patient needs medication assist as well as follow up to ensure that Medicaid and disablility applications are completed Information Needs to be Provided By: CSW and [employer] financial counselor, Horatio Pel [phone]  Barriers to Discharge: Medication compliance, Other (comments) (Spouse will need notice to get off and coordinate family ed.)  Patient Condition: I have reviewed patient's medical record and met with patient and spouse at bedside. Patient is eager to get to IP Rehab to facilitate his safe transition home as independent as possible. Given that patient was fully independent, working, and very active living with and caring for his family he makes an excellent candidate for an IP Rehab admission. Current level of function is Mod +2 assist transfers with mildly dysarthric speech. Patient has ongoing PT, OT, and SLP needs as well as medication non-compliance with HTN and DM II due to finical constraints. Patient would greatly benefit from the coordinated care of the IP Rehab team. He will receive 3 hours of skilled therapy a day, 24/7 nursing care, and daily doctor visits for close medical management to minimize stoke risk factors.  Patient admitted to program 02/04/17.     Preadmission Screen Completed By:  Gunnar Fusi, 02/04/2017 10:19 AM ______________________________________________________________________   Discussed status with Dr. Posey Pronto on 02/04/17 at 1400 and received telephone approval for admission today.  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 Does the need for close, 24 hr/day  Medical supervision in concert with the patient's rehab needs make it unreasonable for this patient to be served  in a less intensive setting? Yes  2. Co-Morbidities requiring supervision/potential complications: hypertension, diabetes mellitus, tobacco abuse 3. Due to safety, disease management and patient education, does the patient require 24 hr/day rehab nursing? Yes 4. Does the patient require coordinated care of a physician, rehab nurse, PT (1-2 hrs/day, 5 days/week), OT (1-2 hrs/day, 5 days/week) and SLP (1-2 hrs/day, 5 days/week) to address physical and functional deficits in the context of the above medical diagnosis(es)? Yes Addressing deficits in the following areas: balance, endurance, locomotion, strength, transferring, bathing, dressing, toileting and cognition 5. Can the patient actively participate in an intensive therapy program of at least 3 hrs of therapy 5 days a week? Yes 6. The potential for patient to make measurable gains while on  inpatient rehab is excellent 7. Anticipated functional outcomes upon discharge from inpatients are: min assist PT, min assist OT, modified independent and supervision SLP 8. Estimated rehab length of stay to reach the above functional goals is: 16-19 days.  9. Does the patient have adequate social supports to accommodate these discharge functional goals? Potentially 10. Anticipated D/C setting: Home 11. Anticipated post D/C treatments: HH therapy and Home excercise program 12. Overall Rehab/Functional Prognosis: good    RECOMMENDATIONS: This patient's condition is appropriate for continued rehabilitative care in the following setting: CIR Patient has agreed to participate in recommended program. Yes Note that insurance prior authorization may be required for reimbursement for recommended care.  Delice Lesch, MD, Mellody Drown Gunnar Fusi 02/04/2017

## 2017-02-03 NOTE — Progress Notes (Signed)
Inpatient Rehabilitation  Met with patient and spouse to discuss team's recommendation for IP Rehab.  Shared booklets and answered questions.  Given that patient was fully independent prior to admission; they are eager for him to participate in the program in order to regain as much independence as possible.  Plan to follow up with the team tomorrow to clarify medical readiness and IP Rehab bed availability.  Hassan Rowan RN CM aware.  Please call with questions.    Carmelia Roller., CCC/SLP Admission Coordinator  [place]  Cell [phone]

## 2017-02-03 NOTE — Progress Notes (Signed)
[facility] [place] [facility]   PATIENT NAME: Stanley Farley    MR#:  [id number]  DATE OF BIRTH:  26-Aug-1975  SUBJECTIVE:  CHIEF COMPLAINT:   Chief Complaint  Patient presents with  . Weakness   -  In good spirits today. Scheduled for TEE tomorrow a.m. to rule out any cardiac source of emboli. -Possible discharge to inpatient rehabilitation tomorrow.  REVIEW OF SYSTEMS:  Review of Systems  Constitutional: Negative for chills, fever and malaise/fatigue.  HENT: Negative for congestion, ear discharge, hearing loss and nosebleeds.   Respiratory: Negative for cough, shortness of breath and wheezing.   Cardiovascular: Negative for chest pain, palpitations and leg swelling.  Gastrointestinal: Negative for abdominal pain, constipation, diarrhea, nausea and vomiting.  Genitourinary: Negative for dysuria and urgency.  Musculoskeletal: Negative for myalgias.  Neurological: Positive for speech change and focal weakness. Negative for dizziness, seizures and headaches.  Psychiatric/Behavioral: Negative for depression.    DRUG ALLERGIES:  No Known Allergies  VITALS:  Blood pressure (!) 157/83, pulse 70, temperature 98.5 F (36.9 C), temperature source Oral, resp. rate 16, height 6\' 2"  (1.88 m), weight 102.1 kg (225 lb), SpO2 100 %.  PHYSICAL EXAMINATION:  Physical Exam  GENERAL:  41 y.o.-year-old patient lying in the bed with no acute distress.  EYES: Pupils equal, round, reactive to light and accommodation. No scleral icterus. Extraocular muscles intact.  HEENT: Head atraumatic, normocephalic. Oropharynx and nasopharynx clear.  NECK:  Supple, no jugular venous distention. No thyroid enlargement, no tenderness.  LUNGS: Normal breath sounds bilaterally, no wheezing, rales,rhonchi or crepitation. No use of accessory muscles of respiration.  CARDIOVASCULAR: S1, S2 normal. No murmurs, rubs, or gallops.  ABDOMEN: Soft, nontender, nondistended. Bowel sounds present. No  organomegaly or mass.  EXTREMITIES: No pedal edema, cyanosis, or clubbing.  NEUROLOGIC: Left facial droop noted, otherwise cranial nerves intact.Speech is much improved today.  Muscle strength 5/5 both upper and lower right side extremities, left sided strength is 1/5. Sensation intact. Gait not checked.  PSYCHIATRIC: The patient is alert and oriented x 3.  SKIN: No obvious rash, lesion, or ulcer.    LABORATORY PANEL:   CBC  Recent Labs Lab 02/01/17 1032  WBC 7.2  HGB 13.1  HCT 37.6*  PLT 208   ------------------------------------------------------------------------------------------------------------------  Chemistries   Recent Labs Lab 02/01/17 1032  NA 137  K 4.2  CL 102  CO2 28  GLUCOSE 310*  BUN 22*  CREATININE 1.36*  CALCIUM 9.0  AST 22  ALT 14*  ALKPHOS 63  BILITOT 0.5   ------------------------------------------------------------------------------------------------------------------  Cardiac Enzymes  Recent Labs Lab 02/01/17 1032  TROPONINI <0.03   ------------------------------------------------------------------------------------------------------------------  RADIOLOGY:  Mr Brain Wo Contrast  Result Date: 02/01/2017 CLINICAL DATA:  Left-sided weakness and slurred speech. EXAM: MRI HEAD WITHOUT CONTRAST TECHNIQUE: Multiplanar, multiecho pulse sequences of the brain and surrounding structures were obtained without intravenous contrast. COMPARISON:  Head CT earlier today FINDINGS: The patient terminated the examination prematurely. A coronal T2 sequence was not obtained. Brain: There is a 1 cm acute infarct in the posterior limb of the right internal capsule. No intracranial hemorrhage, mass, midline shift, or extra-axial fluid collection is identified. There is focal encephalomalacia involving the posterior body of the corpus callosum bilaterally. T2 shine through is noted in this location on diffusion imaging. There is also a subcentimeter focus of  encephalomalacia in the right subinsular white matter. The ventricles and sulci are normal in size. Vascular: Major intracranial vascular flow voids are preserved.  Skull and upper cervical spine: Unremarkable bone marrow signal. Sinuses/Orbits: Unremarkable orbits. Minimal right maxillary sinus mucosal thickening. No significant mastoid fluid. Other: None. IMPRESSION: 1. 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 2. 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 Electronically Signed   By: Sebastian AcheAllen  Grady M.D.   On: 02/01/2017 14:12    EKG:   Orders placed or performed during the hospital encounter of 02/01/17  . ED EKG  . ED EKG  . EKG    ASSESSMENT AND PLAN:   41 year old African-American male with past medical history significant for insulin-dependent diabetes presents to the hospital secondary to left- sided weakness  #1 acute CVA-presenting with slurred speech and dense hemiplegia on the left side. -Family history of stroke present. Patient has risk factors including diabetes and hypertension. -CT angiogram was normal for neck and the head. 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 Continue neuro checks. -on aspirin and statin. -Blood pressure control and diabetes control -PT, OT and speech therapy consult requested.recommended acute inpatient rehabilitation which is appropriate for this patient. -TEE ordered for tomorrow a.m. To rule out any cardiac source of emboli  #2 diabetes mellitus-on Lantus, sliding scale and glipizide A1c is 9.9.  #3 hypertension-on lisinopril  #4 tobacco use disorder-strongly counseled against smoking. On nicotine patch  #5 DVT prophylaxis-on Lovenox   All the records are reviewed and case discussed with Care Management/Social Workerr. Management plans discussed with the patient, family and they are in agreement.  CODE STATUS:  Full code  TOTAL TIME TAKING CARE OF THIS PATIENT: 37 minutes.   POSSIBLE D/C TOMORROW, DEPENDING ON CLINICAL CONDITION.   Enid BaasKALISETTI,Payson Crumby M.D on 02/03/2017 at 1:26 PM  Between 7am to 6pm - Pager - [phone]  After 6pm go to [email or web address] - Social research officer, governmentpassword EPAS [facility]  [facility]  Office  [phone]  CC: Primary care physician; Emogene MorganAycock, Ngwe A, MD

## 2017-02-03 NOTE — Progress Notes (Addendum)
Subjective: No new neurological complaints.    Objective: Current vital signs: BP (!) 153/86   Pulse 72   Temp 98.4 F (36.9 C) (Oral)   Resp 16   Ht 6' 2" (1.88 m)   Wt 102.1 kg (225 lb)   SpO2 99%   BMI 28.89 kg/m  Vital signs in last 24 hours: Temp:  [97.7 F (36.5 C)-98.6 F (37 C)] 98.4 F (36.9 C) (07/31 0811) Pulse Rate:  [65-72] 72 (07/31 0811) Resp:  [16-18] 16 (07/31 0502) BP: (148-171)/(70-93) 153/86 (07/31 0811) SpO2:  [98 %-100 %] 99 % (07/31 0811)  Intake/Output from previous day: No intake/output data recorded. Intake/Output this shift: No intake/output data recorded. Nutritional status: Diet heart healthy/carb modified Room service appropriate? Yes; Fluid consistency: Thin  Neurologic Exam: Mental Status: Alert, oriented, thought content appropriate.  Speech fluent without evidence of aphasia.  Dysarthria improved.  Able to follow 3 step commands without difficulty. Cranial Nerves: II: Discs flat bilaterally; Visual fields grossly normal, pupils equal, round, reactive to light and accommodation III,IV, VI: ptosis not present, extra-ocular motions intact bilaterally V,VII: left facial droop, facial light touch sensation normal bilaterally VIII: hearing normal bilaterally IX,X: gag reflex present XI: bilateral shoulder shrug XII: tongue deviation to the left Motor: Right :  Upper extremity   5/5                                      Left:     Upper extremity   1/5             Lower extremity   5/5                                                  Lower extremity   0/5 Decreased tone on the left Sensory: Pinprick and light touch intact throughout, bilaterally Deep Tendon Reflexes: 1+ in the upper extremities and absent in the lower extremities Plantars: Right: mute                              Left: mute Cerebellar: Normal finger-to-nose and normal heel-to-shin testing on the right.  Unable to perform on the left due to weakness Gait: not tested due to  safety concerns  Lab Results: Basic Metabolic Panel:  Recent Labs Lab 02/01/17 1032  NA 137  K 4.2  CL 102  CO2 28  GLUCOSE 310*  BUN 22*  CREATININE 1.36*  CALCIUM 9.0    Liver Function Tests:  Recent Labs Lab 02/01/17 1032  AST 22  ALT 14*  ALKPHOS 63  BILITOT 0.5  PROT 6.9  ALBUMIN 3.3*   No results for input(s): LIPASE, AMYLASE in the last 168 hours. No results for input(s): AMMONIA in the last 168 hours.  CBC:  Recent Labs Lab 02/01/17 1032  WBC 7.2  NEUTROABS 3.8  HGB 13.1  HCT 37.6*  MCV 95.0  PLT 208    Cardiac Enzymes:  Recent Labs Lab 02/01/17 1032  TROPONINI <0.03    Lipid Panel:  Recent Labs Lab 02/02/17 0454  CHOL 190  TRIG 354*  HDL 29*  CHOLHDL 6.6  VLDL 71*  LDLCALC 90    CBG:  Recent Labs Lab 02/02/17 0734   02/02/17 1146 02/02/17 1643 02/02/17 2002 02/03/17 0741  GLUCAP 216* 198* 221* 192* 128*    Microbiology: No results found for this or any previous visit.  Coagulation Studies:  Recent Labs  02/01/17 1032  LABPROT 12.2  INR 0.91    Imaging: Ct Angio Head W Or Wo Contrast  Result Date: 02/01/2017 CLINICAL DATA:  Left-sided weakness and slurred speech. EXAM: CT ANGIOGRAPHY HEAD AND NECK TECHNIQUE: Multidetector CT imaging of the head and neck was performed using the standard protocol during bolus administration of intravenous contrast. Multiplanar CT image reconstructions and MIPs were obtained to evaluate the vascular anatomy. Carotid stenosis measurements (when applicable) are obtained utilizing NASCET criteria, using the distal internal carotid diameter as the denominator. CONTRAST:  75 mL Isovue 370 COMPARISON:  None. FINDINGS: CTA NECK FINDINGS Aortic arch: Standard 3 vessel aortic arch. Brachiocephalic and subclavian arteries are widely patent. Right carotid system: Patent without evidence of stenosis, dissection, or significant atherosclerosis. Left carotid system: Patent without evidence of  stenosis, dissection, or significant atherosclerosis. Vertebral arteries: Patent and codominant without evidence of stenosis, dissection, or significant atherosclerosis. Skeleton: Mild disc space narrowing and moderate degenerative endplate spurring at C5-6. Other neck: No mass or lymph node enlargement. Poor dentition with multiple caries. Upper chest: Clear lung apices. Review of the MIP images confirms the above findings CTA HEAD FINDINGS Anterior circulation: The internal carotid arteries are widely patent from skullbase to carotid termini. The ACAs and MCAs are patent without evidence of proximal branch occlusion or significant proximal stenosis. Diffuse branch vessel irregularity is attributed to technique. No aneurysm. Posterior circulation: The intracranial vertebral arteries are widely patent to the basilar. Right PICA, left AICA, and bilateral SCA origins are patent. The basilar artery is widely patent. There are patent posterior communicating arteries, right larger than left. The right P1 segment is hypoplastic. PCAs are patent with mild diffuse irregularity attributed to technique. No evidence of flow limiting proximal stenosis. No aneurysm. Venous sinuses: Patent. Anatomic variants: Predominantly fetal contribution to the right PCA. Delayed phase: No abnormal enhancement. Review of the MIP images confirms the above findings IMPRESSION: Negative head and neck CTA. No large vessel occlusion or significant proximal stenosis. Electronically Signed   By: Allen  Grady M.D.   On: 02/01/2017 11:49   Ct Head Wo Contrast  Result Date: 02/01/2017 CLINICAL DATA:  Pt reports woke up this morning with left side weakness, slurred speech reports went to bed last night around midnight. Pt denies any pain. Pt talks in complete sentences EXAM: CT HEAD WITHOUT CONTRAST TECHNIQUE: Contiguous axial images were obtained from the base of the skull through the vertex without intravenous contrast. COMPARISON:  01/09/1998 by  report only FINDINGS: Brain: No evidence of acute infarction, hemorrhage, hydrocephalus, extra-axial collection or mass lesion/mass effect. Vascular: No hyperdense vessel or unexpected calcification. Skull: Normal. Negative for fracture or focal lesion. Sinuses/Orbits: No acute finding. Other: None. IMPRESSION: 1. Negative for bleed or other acute intracranial process. Electronically Signed   By: D  Hassell M.D.   On: 02/01/2017 10:59   Ct Angio Neck W And/or Wo Contrast  Result Date: 02/01/2017 CLINICAL DATA:  Left-sided weakness and slurred speech. EXAM: CT ANGIOGRAPHY HEAD AND NECK TECHNIQUE: Multidetector CT imaging of the head and neck was performed using the standard protocol during bolus administration of intravenous contrast. Multiplanar CT image reconstructions and MIPs were obtained to evaluate the vascular anatomy. Carotid stenosis measurements (when applicable) are obtained utilizing NASCET criteria, using the distal internal carotid diameter as the   denominator. CONTRAST:  75 mL Isovue 370 COMPARISON:  None. FINDINGS: CTA NECK FINDINGS Aortic arch: Standard 3 vessel aortic arch. Brachiocephalic and subclavian arteries are widely patent. Right carotid system: Patent without evidence of stenosis, dissection, or significant atherosclerosis. Left carotid system: Patent without evidence of stenosis, dissection, or significant atherosclerosis. Vertebral arteries: Patent and codominant without evidence of stenosis, dissection, or significant atherosclerosis. Skeleton: Mild disc space narrowing and moderate degenerative endplate spurring at C5-6. Other neck: No mass or lymph node enlargement. Poor dentition with multiple caries. Upper chest: Clear lung apices. Review of the MIP images confirms the above findings CTA HEAD FINDINGS Anterior circulation: The internal carotid arteries are widely patent from skullbase to carotid termini. The ACAs and MCAs are patent without evidence of proximal branch occlusion  or significant proximal stenosis. Diffuse branch vessel irregularity is attributed to technique. No aneurysm. Posterior circulation: The intracranial vertebral arteries are widely patent to the basilar. Right PICA, left AICA, and bilateral SCA origins are patent. The basilar artery is widely patent. There are patent posterior communicating arteries, right larger than left. The right P1 segment is hypoplastic. PCAs are patent with mild diffuse irregularity attributed to technique. No evidence of flow limiting proximal stenosis. No aneurysm. Venous sinuses: Patent. Anatomic variants: Predominantly fetal contribution to the right PCA. Delayed phase: No abnormal enhancement. Review of the MIP images confirms the above findings IMPRESSION: Negative head and neck CTA. No large vessel occlusion or significant proximal stenosis. Electronically Signed   By: Allen  Grady M.D.   On: 02/01/2017 11:49   Mr Brain Wo Contrast  Result Date: 02/01/2017 CLINICAL DATA:  Left-sided weakness and slurred speech. EXAM: MRI HEAD WITHOUT CONTRAST TECHNIQUE: Multiplanar, multiecho pulse sequences of the brain and surrounding structures were obtained without intravenous contrast. COMPARISON:  Head CT earlier today FINDINGS: The patient terminated the examination prematurely. A coronal T2 sequence was not obtained. Brain: There is a 1 cm acute infarct in the posterior limb of the right internal capsule. No intracranial hemorrhage, mass, midline shift, or extra-axial fluid collection is identified. There is focal encephalomalacia involving the posterior body of the corpus callosum bilaterally. T2 shine through is noted in this location on diffusion imaging. There is also a subcentimeter focus of encephalomalacia in the right subinsular white matter. The ventricles and sulci are normal in size. Vascular: Major intracranial vascular flow voids are preserved. Skull and upper cervical spine: Unremarkable bone marrow signal. Sinuses/Orbits:  Unremarkable orbits. Minimal right maxillary sinus mucosal thickening. No significant mastoid fluid. Other: None. IMPRESSION: 1. 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 2. 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 Electronically Signed   By: Allen  Grady M.D.   On: 02/01/2017 14:12    Medications:  I have reviewed the patient's current medications. Scheduled: . aspirin  300 mg Rectal Daily   Or  . aspirin  325 mg Oral Daily  . atorvastatin  40 mg Oral q1800  . enoxaparin (LOVENOX) injection  40 mg Subcutaneous Q24H  . glipiZIDE  10 mg Oral Q breakfast  . insulin aspart  0-5 Units Subcutaneous QHS  . insulin aspart  0-9 Units Subcutaneous TID WC  . insulin glargine  15 Units Subcutaneous Daily  . lisinopril  20 mg Oral Daily  . nicotine  14 mg Transdermal Daily    Assessment/Plan: No new neurological complaints.  On ASA and statin.  Candidate for IP rehab.  Echocardiogram show no intracardiac source   of emboli and EF of 55%.  Although small vessel disease likely etiology with A1c of 9.9, due to age will rule out other etiologies.    Recommendations: 1.  TEE 2.  Continue current medications 3.  Continued therapy 4.  Lab work to be drawn for stroke in the young to include ATIII, lupus anticoagulant, protein C, protein S, ANA, ESR, homocysteine, factor V, anticardiolipin antibodies.  Results may be followed up on an outpatient basis.     LOS: 2 days   Alexis Goodell, MD Neurology [phone] 02/03/2017  10:31 AM

## 2017-02-03 NOTE — Progress Notes (Signed)
SLP Cancellation Note  Patient Details Name: Stanley Farley MRN: [id number] DOB: 30-Oct-1975   Cancelled treatment:       Reason Eval/Treat Not Completed:  (chart reviewed; pt resting at this time)  Noted pt has been accepted to CIR tomorrow.    Jerilynn SomKatherine Watson, MS, CCC-SLP Watson,Katherine 02/03/2017, 4:34 PM

## 2017-02-04 ENCOUNTER — Inpatient Hospital Stay
Admit: 2017-02-04 | Discharge: 2017-02-04 | Disposition: A | Discharge status: Home / Self Care | Payer: Medicaid Other | Attending: Physician Assistant | Admitting: Physician Assistant

## 2017-02-04 ENCOUNTER — Inpatient Hospital Stay (HOSPITAL_COMMUNITY)
Admission: RE | Admit: 2017-02-04 | Discharge: 2017-02-06 | DRG: 092 | Disposition: A | Discharge status: Home Health | Payer: Medicaid Other | Source: Other Acute Inpatient Hospital | Attending: Physical Medicine & Rehabilitation | Admitting: Physical Medicine & Rehabilitation

## 2017-02-04 ENCOUNTER — Encounter
Admission: EM | Disposition: A | Discharge status: Rehabilitation Facility | Payer: Self-pay | Source: Home / Self Care | Attending: Internal Medicine

## 2017-02-04 ENCOUNTER — Encounter: Payer: Self-pay | Admitting: Cardiovascular Disease

## 2017-02-04 DIAGNOSIS — I69393 Ataxia following cerebral infarction: Secondary | ICD-10-CM

## 2017-02-04 DIAGNOSIS — I69322 Dysarthria following cerebral infarction: Secondary | ICD-10-CM | POA: Diagnosis not present

## 2017-02-04 DIAGNOSIS — I6330 Cerebral infarction due to thrombosis of unspecified cerebral artery: Secondary | ICD-10-CM | POA: Diagnosis present

## 2017-02-04 DIAGNOSIS — R2689 Other abnormalities of gait and mobility: Principal | ICD-10-CM | POA: Diagnosis present

## 2017-02-04 DIAGNOSIS — I69398 Other sequelae of cerebral infarction: Secondary | ICD-10-CM

## 2017-02-04 DIAGNOSIS — Z7151 Drug abuse counseling and surveillance of drug abuser: Secondary | ICD-10-CM

## 2017-02-04 DIAGNOSIS — Z9114 Patient's other noncompliance with medication regimen: Secondary | ICD-10-CM

## 2017-02-04 DIAGNOSIS — R531 Weakness: Secondary | ICD-10-CM | POA: Diagnosis not present

## 2017-02-04 DIAGNOSIS — E1165 Type 2 diabetes mellitus with hyperglycemia: Secondary | ICD-10-CM

## 2017-02-04 DIAGNOSIS — F1210 Cannabis abuse, uncomplicated: Secondary | ICD-10-CM | POA: Diagnosis present

## 2017-02-04 DIAGNOSIS — E1142 Type 2 diabetes mellitus with diabetic polyneuropathy: Secondary | ICD-10-CM | POA: Diagnosis present

## 2017-02-04 DIAGNOSIS — Z79899 Other long term (current) drug therapy: Secondary | ICD-10-CM | POA: Diagnosis not present

## 2017-02-04 DIAGNOSIS — Z9119 Patient's noncompliance with other medical treatment and regimen: Secondary | ICD-10-CM | POA: Diagnosis not present

## 2017-02-04 DIAGNOSIS — I10 Essential (primary) hypertension: Secondary | ICD-10-CM | POA: Diagnosis present

## 2017-02-04 DIAGNOSIS — Z8249 Family history of ischemic heart disease and other diseases of the circulatory system: Secondary | ICD-10-CM

## 2017-02-04 DIAGNOSIS — I69354 Hemiplegia and hemiparesis following cerebral infarction affecting left non-dominant side: Secondary | ICD-10-CM | POA: Diagnosis not present

## 2017-02-04 DIAGNOSIS — E785 Hyperlipidemia, unspecified: Secondary | ICD-10-CM | POA: Diagnosis present

## 2017-02-04 DIAGNOSIS — Z833 Family history of diabetes mellitus: Secondary | ICD-10-CM | POA: Diagnosis not present

## 2017-02-04 DIAGNOSIS — Z716 Tobacco abuse counseling: Secondary | ICD-10-CM | POA: Diagnosis not present

## 2017-02-04 DIAGNOSIS — I638 Other cerebral infarction: Secondary | ICD-10-CM | POA: Diagnosis not present

## 2017-02-04 DIAGNOSIS — Z794 Long term (current) use of insulin: Secondary | ICD-10-CM

## 2017-02-04 DIAGNOSIS — I69392 Facial weakness following cerebral infarction: Secondary | ICD-10-CM

## 2017-02-04 DIAGNOSIS — Z720 Tobacco use: Secondary | ICD-10-CM

## 2017-02-04 DIAGNOSIS — F17290 Nicotine dependence, other tobacco product, uncomplicated: Secondary | ICD-10-CM | POA: Diagnosis present

## 2017-02-04 DIAGNOSIS — I6300 Cerebral infarction due to thrombosis of unspecified precerebral artery: Secondary | ICD-10-CM

## 2017-02-04 HISTORY — PX: TEE WITHOUT CARDIOVERSION: SHX5443

## 2017-02-04 LAB — HOMOCYSTEINE: Homocysteine: 15 umol/L (ref 0.0–15.0)

## 2017-02-04 LAB — PROTEIN C, TOTAL: Protein C, Total: 99 % (ref 60–150)

## 2017-02-04 LAB — CREATININE, SERUM
Creatinine, Ser: 1.38 mg/dL — ABNORMAL HIGH (ref 0.61–1.24)
GFR calc Af Amer: >60 mL/min (ref >60)
GFR calc non Af Amer: >60 mL/min (ref >60)

## 2017-02-04 LAB — GLUCOSE, CAPILLARY
GLUCOSE-CAPILLARY: 127 mg/dL — AB (ref 65–99)
Glucose-Capillary: 104 mg/dL — ABNORMAL HIGH (ref 65–99)
Glucose-Capillary: 105 mg/dL — ABNORMAL HIGH (ref 65–99)
Glucose-Capillary: 199 mg/dL — ABNORMAL HIGH (ref 65–99)

## 2017-02-04 LAB — CARDIOLIPIN ANTIBODIES, IGG, IGM, IGA
Anticardiolipin IgA: <9 APL U/mL (ref 0–11)
Anticardiolipin IgM: <9 MPL U/mL (ref 0–12)

## 2017-02-04 LAB — LUPUS ANTICOAGULANT PANEL
DRVVT: 34 s (ref 0.0–47.0)
PTT Lupus Anticoagulant: 39.3 s (ref 0.0–51.9)

## 2017-02-04 LAB — ANTINUCLEAR ANTIBODIES, IFA: ANA Ab, IFA: NEGATIVE

## 2017-02-04 LAB — CBC
HEMATOCRIT: 35.9 % — AB (ref 39.0–52.0)
Hemoglobin: 12.8 g/dL — ABNORMAL LOW (ref 13.0–17.0)
MCH: 32.3 pg (ref 26.0–34.0)
MCHC: 35.7 g/dL (ref 30.0–36.0)
MCV: 90.7 fL (ref 78.0–100.0)
Platelets: 203 10*3/uL (ref 150–400)
RBC: 3.96 MIL/uL — ABNORMAL LOW (ref 4.22–5.81)
RDW: 12.5 % (ref 11.5–15.5)
WBC: 6.1 10*3/uL (ref 4.0–10.5)

## 2017-02-04 LAB — PROTEIN S, TOTAL: Protein S Ag, Total: 110 % (ref 60–150)

## 2017-02-04 SURGERY — ECHOCARDIOGRAM, TRANSESOPHAGEAL
Anesthesia: Moderate Sedation

## 2017-02-04 MED ORDER — SODIUM CHLORIDE 0.9 % IV SOLN: INTRAVENOUS | Status: DC

## 2017-02-04 MED ORDER — ACETAMINOPHEN 160 MG/5ML PO SOLN: 650.0000 mg | ORAL | Status: DC | PRN

## 2017-02-04 MED ORDER — ATORVASTATIN CALCIUM 40 MG PO TABS
40.0000 mg | ORAL_TABLET | Freq: Every day | ORAL | Status: DC
  Administered 2017-02-04 – 2017-02-05 (×2): 40 mg via ORAL
  Filled 2017-02-04 (×2): qty 1

## 2017-02-04 MED ORDER — INSULIN GLARGINE 100 UNIT/ML ~~LOC~~ SOLN
15.0000 [IU] | Freq: Every day | SUBCUTANEOUS | Status: DC
  Administered 2017-02-05 – 2017-02-06 (×2): 15 [IU] via SUBCUTANEOUS
  Filled 2017-02-04 (×2): qty 0.15

## 2017-02-04 MED ORDER — LIDOCAINE VISCOUS 2 % MT SOLN
OROMUCOSAL | Status: AC
  Filled 2017-02-04: qty 15

## 2017-02-04 MED ORDER — ASPIRIN 325 MG PO TABS: 325.0000 mg | ORAL_TABLET | Freq: Every day | ORAL | 2 refills | Status: DC

## 2017-02-04 MED ORDER — LISINOPRIL 20 MG PO TABS: 20.0000 mg | ORAL_TABLET | Freq: Every day | ORAL | 2 refills | Status: DC

## 2017-02-04 MED ORDER — BUTAMBEN-TETRACAINE-BENZOCAINE 2-2-14 % EX AERO
INHALATION_SPRAY | CUTANEOUS | Status: DC
Start: 2017-02-04 — End: 2017-02-04
  Filled 2017-02-04: qty 20

## 2017-02-04 MED ORDER — ENOXAPARIN SODIUM 40 MG/0.4ML ~~LOC~~ SOLN
40.0000 mg | SUBCUTANEOUS | Status: DC
  Administered 2017-02-04 – 2017-02-05 (×2): 40 mg via SUBCUTANEOUS
  Filled 2017-02-04 (×2): qty 0.4

## 2017-02-04 MED ORDER — ONDANSETRON HCL 4 MG PO TABS: 4.0000 mg | ORAL_TABLET | Freq: Four times a day (QID) | ORAL | Status: DC | PRN

## 2017-02-04 MED ORDER — ACETAMINOPHEN 325 MG PO TABS: 650.0000 mg | ORAL_TABLET | ORAL | Status: DC | PRN

## 2017-02-04 MED ORDER — MIDAZOLAM HCL 2 MG/2ML IJ SOLN
INTRAMUSCULAR | Status: AC | PRN
  Administered 2017-02-04 (×2): 1 mg via INTRAVENOUS

## 2017-02-04 MED ORDER — NICOTINE 14 MG/24HR TD PT24: 14.0000 mg | MEDICATED_PATCH | Freq: Every day | TRANSDERMAL | 0 refills | Status: DC

## 2017-02-04 MED ORDER — FENTANYL CITRATE (PF) 100 MCG/2ML IJ SOLN
INTRAMUSCULAR | Status: AC
  Filled 2017-02-04: qty 2

## 2017-02-04 MED ORDER — ASPIRIN 325 MG PO TABS
325.0000 mg | ORAL_TABLET | Freq: Every day | ORAL | Status: DC
  Administered 2017-02-05 – 2017-02-06 (×2): 325 mg via ORAL
  Filled 2017-02-04 (×2): qty 1

## 2017-02-04 MED ORDER — INSULIN GLARGINE 100 UNIT/ML ~~LOC~~ SOLN: 20.0000 [IU] | Freq: Every day | SUBCUTANEOUS | 11 refills | Status: DC

## 2017-02-04 MED ORDER — NICOTINE 14 MG/24HR TD PT24
14.0000 mg | MEDICATED_PATCH | Freq: Every day | TRANSDERMAL | Status: DC
  Filled 2017-02-04 (×2): qty 1

## 2017-02-04 MED ORDER — SORBITOL 70 % SOLN
30.0000 mL | Freq: Every day | Status: DC | PRN
  Administered 2017-02-05: 30 mL via ORAL
  Filled 2017-02-04: qty 30

## 2017-02-04 MED ORDER — MIDAZOLAM HCL 5 MG/5ML IJ SOLN
INTRAMUSCULAR | Status: AC
  Filled 2017-02-04: qty 5

## 2017-02-04 MED ORDER — ASPIRIN 300 MG RE SUPP: 300.0000 mg | Freq: Every day | RECTAL | Status: DC

## 2017-02-04 MED ORDER — ACETAMINOPHEN 650 MG RE SUPP: 650.0000 mg | RECTAL | Status: DC | PRN

## 2017-02-04 MED ORDER — TRAZODONE HCL 50 MG PO TABS
50.0000 mg | ORAL_TABLET | Freq: Every evening | ORAL | Status: DC | PRN
  Administered 2017-02-05 (×2): 50 mg via ORAL
  Filled 2017-02-04 (×2): qty 1

## 2017-02-04 MED ORDER — FENTANYL CITRATE (PF) 100 MCG/2ML IJ SOLN
INTRAMUSCULAR | Status: AC | PRN
  Administered 2017-02-04 (×5): 25 ug via INTRAVENOUS

## 2017-02-04 MED ORDER — OXYCODONE-ACETAMINOPHEN 5-325 MG PO TABS: 1.0000 | ORAL_TABLET | Freq: Four times a day (QID) | ORAL | Status: DC | PRN

## 2017-02-04 MED ORDER — LISINOPRIL 20 MG PO TABS
20.0000 mg | ORAL_TABLET | Freq: Every day | ORAL | Status: DC
  Administered 2017-02-05 – 2017-02-06 (×2): 20 mg via ORAL
  Filled 2017-02-04 (×2): qty 1

## 2017-02-04 MED ORDER — ONDANSETRON HCL 4 MG/2ML IJ SOLN: 4.0000 mg | Freq: Four times a day (QID) | INTRAMUSCULAR | Status: DC | PRN

## 2017-02-04 MED ORDER — ENOXAPARIN SODIUM 40 MG/0.4ML ~~LOC~~ SOLN: 40.0000 mg | SUBCUTANEOUS | Status: DC

## 2017-02-04 MED ORDER — GLIPIZIDE ER 10 MG PO TB24
10.0000 mg | ORAL_TABLET | Freq: Every day | ORAL | Status: DC
  Administered 2017-02-05 – 2017-02-06 (×2): 10 mg via ORAL
  Filled 2017-02-04 (×2): qty 1

## 2017-02-04 MED ORDER — ATORVASTATIN CALCIUM 40 MG PO TABS: 40.0000 mg | ORAL_TABLET | Freq: Every day | ORAL | 2 refills | Status: DC

## 2017-02-04 MED ORDER — MIDAZOLAM HCL 5 MG/5ML IJ SOLN
INTRAMUSCULAR | Status: AC | PRN
  Administered 2017-02-04 (×3): 1 mg via INTRAVENOUS

## 2017-02-04 MED ORDER — INSULIN ASPART 100 UNIT/ML ~~LOC~~ SOLN: 0.0000 [IU] | Freq: Every day | SUBCUTANEOUS | Status: DC

## 2017-02-04 MED ORDER — INSULIN GLARGINE 100 UNIT/ML ~~LOC~~ SOLN
18.0000 [IU] | Freq: Every day | SUBCUTANEOUS | 11 refills | Status: DC
Start: 2017-02-04 — End: 2017-02-04

## 2017-02-04 NOTE — Progress Notes (Signed)
Jamse Arn, MD Physician Signed Physical Medicine and Rehabilitation  PMR Pre-admission Date of Service: 02/03/2017 8:39 PM  Related encounter: ED to Hosp-Admission (Discharged) from 02/01/2017 in [facility] (1C)       '[]' Hide copied text   [employer] PMR Admission Coordinator Pre-Admission Assessment  Patient: Stanley Farley is an 41 y.o., male MRN: [id number] DOB: Jan 21, 1976 Height: '6\' 2"'  (188 cm) Weight: 102.1 kg (225 lb)  Insurance Information HMO:     PPO:      PCP:      IPA:      80/20:      OTHER:  PRIMARY: uninsured       Policy#:       Subscriber:  CM Name:       Phone#:      Fax#:  Pre-Cert#:       Employer:  Benefits:  Phone #:      Name:  Eff. 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 Per patient report and chart review patient lives with spouse and 3 children ages 65, 50, & 63. 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 He lives in a one level home with 2 steps to enter. Presented to [facility] 02/01/2017 with left-sided weakness, slurred speech as well as intermittent nausea and vomiting. Cranial CT scan negative. CTA  of head and neck showed no large vessel occlusion or stenosis. 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 Small chronic infarctions of the remote insults in the corpus colostrum bilaterally and right subinsular region.Patient did not receive TPA. Echocardiogram with ejection fraction of 55%. Systolic function normal and no intracardiac source of emboli. Presently maintained on aspirin for CVA prophylaxis. Subcutaneous Lovenox for DVT prophylaxis. Nicoderm patch initiated for tobacco abuse. Tolerating a regular consistency diet. Physical and occupational therapy evaluations completed with recommendations of physical medicine rehabilitation consult. Patient was admitted for a comprehensive rehabilitation program 02/04/17.  Patient[facility] medical record from [facility] has been reviewed by the rehabilitation admission coordinator and physician.  NIH Stroke scale: 10  Past Medical History      Past Medical History:  Diagnosis Date  . Diabetes mellitus without complication ([place])   . Hypertension     Family History   family history includes Diabetes in his mother; Heart attack in his father; Hypertension in his father.  Prior Rehab/Hospitalizations Has the patient had major surgery during 100 days prior to  admission? No               Current Medications Refer to [facility] MAR   Patients Current Diet:  Heart Healthy & Carbohydrate Modified diet restrictions   Precautions / Restrictions Precautions Precautions: Fall Restrictions Weight Bearing Restrictions: No   Has the patient had 2 or more falls or a fall with injury in the past year?No  Prior Activity Level Community (5-7x/wk): Prior to admission patient was fully independent and active.  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    Prior Functional Level Self Care: Did the patient need help bathing, dressing, using the toilet or eating? Independent  Indoor Mobility: Did  the patient need assistance with walking from room to room (with or without device)? Independent  Stairs: Did the patient need assistance with internal or external stairs (with or without device)? Independent  Functional Cognition: Did the patient need help planning regular tasks such as shopping or remembering to take medications? Independent  Home Assistive Devices / Equipment Home Assistive Devices/Equipment: None Home Equipment: None  Prior Device Use: Indicate devices/aids used by the patient prior to current illness, exacerbation or injury? None of the above   Prior Functional Level Current Functional Level  Bed Mobility  Independent   Mod A with elevated HOB    Transfers  Independent   Mod +2 assist for sit to stands    Mobility - Walk/Wheelchair  Independent   Mod-Max A +2 for 3 feet   Upper Body Dressing  Independent   Mod A bed level    Lower Body Dressing  Independent   Max A bed level    Grooming  Independent   Supervision set-up bed level    Eating/Drinking  Independent   Supervision set-up bed level    Toilet Transfer  Independent   TBD, deferred due to safety    Bladder Continence   Independent, Yes   Continent    Bowel Management  Independent, Continent yes   Continent    Stair Climbing  Independent    TBD, deferred due to safety   Communication  Independent    Min A   Memory  Independent    Supervision    Cooking/Meal Prep  Passenger transport manager  Independent     Driving  Independent       Special needs/care consideration BiPAP/CPAP: No CPM: No Continuous Drip IV: No Dialysis: No         Life Vest: No Oxygen: No Special Bed: No Trach Size: No Wound Vac (area): No       Skin: WDL                           Bowel mgmt: Continent, last BM 02/01/17 Bladder mgmt: Continent  Diabetic mgmt: HgbA1c 9.9 non-compliant with  meds prior to admission due to lack of insurance and finical resources   Previous Home Environment Living Arrangements: Spouse/significant other, Children (3 sons (48, 79, and 10 y.o.))  Lives With: Spouse, Family, Son Available Help at Discharge: Family, Available 24 hours/day Type of Home: House Home Layout: One level Home Access: Stairs to enter Entrance Stairs-Rails: Can reach both, Left, Right Entrance Stairs-Number of Steps: 2 steps w/ bilateral hand rails for BACK door; front door with 15 steps and bilateral hand rails Bathroom Shower/Tub: Tub/shower unit, Multimedia programmer: Standard Home  Care Services: No  Discharge Living Setting Plans for Discharge Living Setting: Patient's home, Lives with (comment) (Spouse and 3 children (ages: 14, 97, 10)) Type of Home at Discharge: House Discharge Home Layout: One level Discharge Home Access: Stairs to enter Entrance Stairs-Rails: Right, Left, Can reach both Entrance Stairs-Number of Steps: 2 steps at the back door Discharge Bathroom Shower/Tub: Walk-in shower Discharge Bathroom Toilet: Standard Discharge Bathroom Accessibility: Yes How Accessible: Accessible via walker Does the patient have any problems obtaining your medications?: Yes (Describe) (patient is uninsured; Development worker, community has met with them)  Social/Family/Support Systems Patient Roles: Spouse, Parent, Other (Comment) (Friend ) Contact Information: Spouse: Winfred Iiams [phone] Anticipated Caregiver: Spouse and family  Anticipated Caregiver's Contact Information: see above  Ability/Limitations of Caregiver: Spouse works, but will coordinate 24/7 assist from family  Caregiver Availability: 24/7 Discharge Plan Discussed with Primary Caregiver: Yes Is Caregiver In Agreement with Plan?: Yes Does Caregiver/Family have Issues with Lodging/Transportation while Pt is in Rehab?: No  Goals/Additional Needs Patient/Family Goal for Rehab: PT/OT Min  A-Supervision; SLP Supervision  Expected length of stay: 14-18 days  Cultural Considerations: Patient believes in Jesus and God Dietary Needs: Heart Healthy and Carb. Mod. diet restrictions  Equipment Needs: TBD Special Service Needs: Patient uninsured and has been working with a Development worker, community from [facility] Additional Information: Aleene Davidson  [phone]  Pt/Family Agrees to Admission and willing to participate: Yes Program Orientation Provided & Reviewed with Pt/Caregiver Including Roles  & Responsibilities: Yes Additional Information Needs: Patient needs medication assist as well as follow up to ensure that Medicaid and disablility applications are completed Information Needs to be Provided By: CSW and [employer] financial counselor, Horatio Pel [phone]  Barriers to Discharge: Medication compliance, Other (comments) (Spouse will need notice to get off and coordinate family ed.)  Patient Condition: I have reviewed patient's medical record and met with patient and spouse at bedside. Patient is eager to get to IP Rehab to facilitate his safe transition home as independent as possible. Given that patient was fully independent, working, and very active living with and caring for his family he makes an excellent candidate for an IP Rehab admission. Current level of function is Mod +2 assist transfers with mildly dysarthric speech. Patient has ongoing PT, OT, and SLP needs as well as medication non-compliance with HTN and DM II due to finical constraints. Patient would greatly benefit from the coordinated care of the IP Rehab team. He will receive 3 hours of skilled therapy a day, 24/7 nursing care, and daily doctor visits for close medical management to minimize stoke risk factors.  Patient admitted to program 02/04/17.     Preadmission Screen Completed By:  Gunnar Fusi, 02/04/2017 10:19 AM ______________________________________________________________________   Discussed status with Dr.  Posey Pronto on 02/04/17 at 1400 and received telephone approval for admission today.  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 Does the need for close, 24 hr/day  Medical supervision in concert with the patient's rehab needs make it unreasonable for this patient to be served in a less intensive setting? Yes  2. Co-Morbidities requiring supervision/potential complications: hypertension, diabetes mellitus, tobacco abuse 3. Due to safety, disease management and patient education, does the patient require 24 hr/day rehab nursing? Yes 4. Does the patient require coordinated care of a physician, rehab nurse, PT (1-2 hrs/day, 5 days/week), OT (1-2 hrs/day, 5 days/week) and SLP (1-2 hrs/day, 5  days/week) to address physical and functional deficits in the context of the above medical diagnosis(es)? Yes Addressing deficits in the following areas: balance, endurance, locomotion, strength, transferring, bathing, dressing, toileting and cognition 5. Can the patient actively participate in an intensive therapy program of at least 3 hrs of therapy 5 days a week? Yes 6. The potential for patient to make measurable gains while on inpatient rehab is excellent 7. Anticipated functional outcomes upon discharge from inpatients are: min assist PT, min assist OT, modified independent and supervision SLP 8. Estimated rehab length of stay to reach the above functional goals is: 16-19 days.  9. Does the patient have adequate social supports to accommodate these discharge functional goals? Potentially 10. Anticipated D/C setting: Home 11. Anticipated post D/C treatments: HH therapy and Home excercise program 12. Overall Rehab/Functional Prognosis: good    RECOMMENDATIONS: This patient's condition is appropriate for continued rehabilitative care in the following setting: CIR Patient has agreed to  participate in recommended program. Yes Note that insurance prior authorization may be required for reimbursement for recommended care.  Delice Lesch, MD, Mellody Drown Gunnar Fusi 02/04/2017    Revision History

## 2017-02-04 NOTE — Clinical Social Work Note (Signed)
Pt is ready for discharge today and will go to CIR. CSW is signing off as no further needs identified.   Dede QuerySarah Juanice Warburton, MSW, LCSW  Clinical Social Worker [phone]

## 2017-02-04 NOTE — Progress Notes (Signed)
OT Cancellation Note  Patient Details Name: Stanley Farley MRN: [id number] DOB: 1975/10/13   Cancelled Treatment:    Reason Eval/Treat Not Completed: Patient at procedure or test/ unavailable. Pt currently undergoing testing. Will re-attempt OT treatment later this date.  Richrd PrimeJamie Stiller, MPH, MS, OTR/L ascom [phone] 02/04/17, 8:07 AM

## 2017-02-04 NOTE — Progress Notes (Signed)
  Speech Language Pathology Treatment: Cognitive-Linquistic  Patient Details Name: Stanley Farley MRN: [id number] DOB: 1975-12-13 Today's Date: 02/04/2017 Time: 0935-1010 SLP Time Calculation (min) (ACUTE ONLY): 35 min  Assessment / Plan / Recommendation Clinical Impression  Pt appears to present w/ mild+ Dysarthria during conversation/speech w/ others. Motor Speech Tasks reveal min decreased tone and ROM/strength in Left labial/facial area; tongue grossly wfl w/ less slight deviation to the Left. In connected or quick, alternating movements, min decreased lingual coordination and timing were noted - this was noted in connected speech at sentence/conversation level more consistently resulting in min Dysarthria and Dysfluency of speech - most noted at conversational level when pt was talking rapidly in conversation w/ others. When strategies of slowing his rate, increasing his volume slightly, and over-articulating were implemented, pt was able to significantly increase his articulation and intelligibility of speech. These strategies were discussed, modeled, and practiced w/ pt and sons for homework/use in conversation w/ others. Discussed and practiced OMEs and strategies on Handouts w/ min+ verbal cues and model necessary for full follow through w/ tasks. Encouraged pt to use a mirror and wife/sons as feedback during exercises.    HPI HPI: Pt is a 41 y.o. male with a past medical history of diabetes, hypertension, presents to the emergency department for left arm and leg weakness. According to the patient since awakening in the morning, he has been experiencing left arm and leg weakness. No history of CVA in the past. Patient also nauseated with vomiting in the emergency department and hypertensive, 175/86. Patient is prescribed hypertensive medications which he states he has been taking. Patient denies any headache or confusion; able to converse in conversation w/ others w/ min slurred speech but  improved. Pt is able to use his strategies to increase articulation of his speech.       SLP Plan  Continue with current plan of care; pt is discharging to Inpatient Rehab today - encouraged pt to continue w/ ST services for ongoing education and therapy (though pt stated he felt his speech was "good now". Updated NSG/CM.       Recommendations  Diet recommendations: Regular;Thin liquid Medication Administration: Whole meds with liquid (per NSG) Supervision: Patient able to self feed                General recommendations: OT consult;PT consult Oral Care Recommendations: Oral care BID;Patient independent with oral care Follow up Recommendations: Inpatient Rehab SLP Visit Diagnosis: Dysarthria and anarthria (R47.1) Plan: Continue with current plan of care       GO                Jerilynn SomKatherine Watson, MS, CCC-SLP Watson,Katherine 02/04/2017, 2:24 PM

## 2017-02-04 NOTE — Progress Notes (Signed)
Pt admitted to unit at 1600 via [facility] from [facility]. Rn educated and reviewed rehab booklet, safety plan, and schedule with verbal understanding. Pt very motivated and ready to work. Call bell within reach.

## 2017-02-04 NOTE — OR Nursing (Signed)
Report was called to Diedra, RN for pt in room 111. Orders and medications given were reviewed. Pt transferred to 111 on RA at this time.

## 2017-02-04 NOTE — Progress Notes (Signed)
Patient transferred to [facility]. VSS. Report given to AlgeriaJeanna. [facility] by [facility].

## 2017-02-04 NOTE — Progress Notes (Signed)
Inpatient Rehabilitation  Discussed with Steward DroneBrenda, RN CM that patient is medically ready for discharge today and we have a bed to offer.  Plan to proceed with admission to IP Rehab today.  Please call with questions.   Charlane FerrettiMelissa Anneth Brunell, M.A., CCC/SLP Admission Coordinator  [facility]  Cell [phone]

## 2017-02-04 NOTE — Discharge Summary (Signed)
[facility] - [place] at [facility]   PATIENT NAME: Stanley Farley    MR#:  [id number]  DATE OF BIRTH:  1975-08-13  DATE OF ADMISSION:  02/01/2017   ADMITTING PHYSICIAN: Stanley PollackQing Chen, MD  DATE OF DISCHARGE: 02/04/2017  PRIMARY CARE PHYSICIAN: Stanley Farley, Ngwe A, MD   ADMISSION DIAGNOSIS:   Left-sided weakness [R53.1] Cerebrovascular accident (CVA), unspecified mechanism (HCC) [I63.9]  DISCHARGE DIAGNOSIS:   Active Problems:   Acute CVA (cerebrovascular accident) (HCC)   SECONDARY DIAGNOSIS:   Past Medical History:  Diagnosis Date  . Diabetes mellitus without complication (HCC)   . Hypertension     HOSPITAL COURSE:   41 year old African-American male with past medical history significant for insulin-dependent diabetes presents to the hospital secondary to left- sided weakness  #1 acute CVA-presenting with slurred speech and dense hemiplegia on the left side. -Family history of stroke present. Patient has risk factors including diabetes and hypertension. -CT angiogram was normal for neck and the head. 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 -on aspirin and statin. -Blood pressure control and diabetes control -PT, OT and speech therapy consult requested.recommended acute inpatient rehabilitation which is appropriate for this patient. -TEE ordered - negative for any cardiac source of emboli, no mural thrombus noted. Normal right-sided pressures, no wall motion abnormalities, no PFO on bubble study  #2 diabetes mellitus-A1c is 9.9. Noncompliant with medications at home. Started on Lantus, being discharged on 18 units. Please adjust based on sugars.  #3 hypertension-on lisinopril  #4 tobacco use disorder-strongly counseled against smoking. On nicotine patch  Will be discharged to acute inpatient rehabilitation today   DISCHARGE CONDITIONS:   Guarded  CONSULTS OBTAINED:   Treatment Team:  Pauletta BrownsZeylikman, Yuriy,  MD Antonieta Farley, Stanley J, MD  DRUG ALLERGIES:   No Known Allergies DISCHARGE MEDICATIONS:   Allergies as of 02/04/2017   No Known Allergies     Medication List    STOP taking these medications   glipiZIDE 10 MG 24 hr tablet Commonly known as:  GLUCOTROL XL   insulin detemir 100 UNIT/ML injection Commonly known as:  LEVEMIR     TAKE these medications   aspirin 325 MG tablet Take 1 tablet (325 mg total) by mouth daily.   atorvastatin 40 MG tablet Commonly known as:  LIPITOR Take 1 tablet (40 mg total) by mouth daily at 6 PM.   insulin glargine 100 UNIT/ML injection Commonly known as:  LANTUS Inject 0.2 mLs (20 Units total) into the skin daily.   lisinopril 20 MG tablet Commonly known as:  PRINIVIL,ZESTRIL Take 1 tablet (20 mg total) by mouth daily.   nicotine 14 mg/24hr patch Commonly known as:  NICODERM CQ - dosed in mg/24 hours Place 1 patch (14 mg total) onto the skin daily.        DISCHARGE INSTRUCTIONS:   1. PCP f/u in 1 weeks 2. Neurology f/u in 2-3 weeks  DIET:   Cardiac diet and Diabetic diet  ACTIVITY:   Activity as tolerated  OXYGEN:   Home Oxygen: No.  Oxygen Delivery: room air  DISCHARGE LOCATION:   home   If you experience worsening of your admission symptoms, develop shortness of breath, life threatening emergency, suicidal or homicidal thoughts you must seek medical attention immediately by calling 911 or calling your MD immediately  if symptoms less severe.  You Must read complete instructions/literature along with all the possible adverse reactions/side effects for all the Medicines you take and that have been prescribed  to you. Take any new Medicines after you have completely understood and accpet all the possible adverse reactions/side effects.   Please note  You were cared for by a hospitalist during your hospital stay. If you have any questions about your discharge medications or the care you received while you were in the  hospital after you are discharged, you can call the unit and asked to speak with the hospitalist on call if the hospitalist that took care of you is not available. Once you are discharged, your primary care physician will handle any further medical issues. Please note that NO REFILLS for any discharge medications will be authorized once you are discharged, as it is imperative that you return to your primary care physician (or establish a relationship with a primary care physician if you do not have one) for your aftercare needs so that they can reassess your need for medications and monitor your lab values.    On the day of Discharge:  VITAL SIGNS:   Blood pressure 138/84, pulse 67, temperature 98 F (36.7 C), temperature source Oral, resp. rate 15, height 6\' 2"  (1.88 m), weight 102.1 kg (225 lb), SpO2 99 %.  PHYSICAL EXAMINATION:   GENERAL:  41 y.o.-year-old patient lying in the bed with no acute distress.  EYES: Pupils equal, round, reactive to light and accommodation. No scleral icterus. Extraocular muscles intact.  HEENT: Head atraumatic, normocephalic. Oropharynx and nasopharynx clear.  NECK:  Supple, no jugular venous distention. No thyroid enlargement, no tenderness.  LUNGS: Normal breath sounds bilaterally, no wheezing, rales,rhonchi or crepitation. No use of accessory muscles of respiration.  CARDIOVASCULAR: S1, S2 normal. No murmurs, rubs, or gallops.  ABDOMEN: Soft, nontender, nondistended. Bowel sounds present. No organomegaly or mass.  EXTREMITIES: No pedal edema, cyanosis, or clubbing.  NEUROLOGIC: Left facial droop noted, otherwise cranial nerves intact.Speech is much improved today.  Muscle strength 5/5 both upper and lower right side extremities, left sided strength is 1/5. Sensation intact. Gait not checked.  PSYCHIATRIC: The patient is alert and oriented x 3.  SKIN: No obvious rash, lesion, or ulcer.   DATA REVIEW:   CBC  Recent Labs Lab 02/01/17 1032  WBC 7.2  HGB  13.1  HCT 37.6*  PLT 208    Chemistries   Recent Labs Lab 02/01/17 1032  NA 137  K 4.2  CL 102  CO2 28  GLUCOSE 310*  BUN 22*  CREATININE 1.36*  CALCIUM 9.0  AST 22  ALT 14*  ALKPHOS 63  BILITOT 0.5     Microbiology Results  No results found for this or any previous visit.  RADIOLOGY:  No results found.   Management plans discussed with the patient, family and they are in agreement.  CODE STATUS:     Code Status Orders        Start     Ordered   02/01/17 1610  Full code  Continuous     02/01/17 1609    Code Status History    Date Active Date Inactive Code Status Order ID Comments User Context   This patient has a current code status but no historical code status.      TOTAL TIME TAKING CARE OF THIS PATIENT: 37 minutes.    Yoon Barca M.D on 02/04/2017 at 10:54 AM  Between 7am to 6pm - Pager - [phone]  After 6pm go to [email or web address] - Scientist, research (life sciences)password [email or web address]  [facility] [place] [facility]  Office  [phone]  CC: Primary care physician; Aycock, Ngwe A,  MD   Note: This dictation was prepared with Dragon dictation along with smaller phrase technology. Any transcriptional errors that result from this process are unintentional.

## 2017-02-04 NOTE — Progress Notes (Signed)
*  PRELIMINARY RESULTS* Echocardiogram Echocardiogram Transesophageal has been performed.  Stanley Farley, Stanley Farley 02/04/2017, 9:11 AM

## 2017-02-04 NOTE — H&P (Signed)
Physical Medicine and Rehabilitation Admission H&P    Chief Complaint  Patient presents with  . Weakness  : HPI:  41 year old right-handed male with history of hypertension and diabetes mellitus as well as tobacco/Marijuana use. As per chart review patient, lives with spouse. 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 One level home with 2 steps to entry. Presented to [facility] 02/01/2017 with left-sided weakness, slurred speech as well as intermittent nausea and vomiting. Cranial CT reviewed, negative. CTA of head and neck no large vessel occlusion or stenosis. 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 Patient did not receive TPA. Echocardiogram with ejection fraction of 55%. Systolic function normal and no intracardiac source of emboli. TEE showed estimated ejection fraction of 55%. No apical thrombus or PFO. Bubble study was negative for shunt. Presently maintained on aspirin for CVA prophylaxis.Subcutaneous Lovenox for DVT prophylaxis.Hemoglobin A1c of 9.9. Patient had not been taking his oral agent or insulin for the past month due to bouts of hypoglycemia versus medical noncompliance. Nicoderm patch initiated for tobacco abuse. Tolerating a regular consistency diet. Physical and occupational therapy evaluations completed with recommendations of physical medicine rehabilitation consult. Patient was admitted for a comprehensive rehabilitation program.  Review of Systems  Constitutional: Negative for chills and fever.  HENT: Negative for hearing loss.   Eyes: Negative for blurred vision and double vision.  Respiratory: Negative for cough and shortness of breath.   Cardiovascular: Negative for chest pain, palpitations and leg swelling.  Gastrointestinal: Positive for constipation. Negative for nausea and vomiting.   Genitourinary: Negative for dysuria, flank pain and hematuria.  Musculoskeletal: Positive for myalgias.  Skin: Negative for rash.  Neurological: Positive for speech change and focal weakness. Negative for sensory change and seizures.  All other systems reviewed and are negative.  Past Medical History:  Diagnosis Date  . Diabetes mellitus without complication (HCC)   . Hypertension    History reviewed. No pertinent surgical history. Family History  Problem Relation Age of Onset  . Diabetes Mother   . Hypertension Father   . Heart attack Father    Social History:  reports that he has been smoking Cigars.  He has smoked for the past 5.00 years. He has never used smokeless tobacco. He reports that he uses drugs, including Marijuana. He reports that he does not drink alcohol. Allergies: No Known Allergies Medications Prior to Admission  Medication Sig Dispense Refill  . glipiZIDE (GLUCOTROL XL) 10 MG 24 hr tablet Take 10 mg by mouth daily with breakfast.    . insulin detemir (LEVEMIR) 100 UNIT/ML injection Inject 30 Units into the skin daily.     Marland Kitchen lisinopril (PRINIVIL,ZESTRIL) 20 MG tablet Take 1 tablet (20 mg total) by mouth daily. 60 tablet 0    Home: Home Living Family/patient expects to be discharged to:: Private residence Living Arrangements: Spouse/significant other, Children (3 sons (51, 40, and 56 y.o.)) Available Help at Discharge: Family, Available 24 hours/day Type of Home: House Home Access: Stairs to enter [employer] of Steps: 2 steps w/ bilateral hand rails for BACK door; front door with 15 steps and bilateral hand rails Entrance Stairs-Rails: Can reach both, Left, Right Home Layout: One level Bathroom Shower/Tub: Tub/shower unit, Health visitor: Standard Home Equipment: None  Lives With: Spouse, Family, Son   Functional History: Prior Function Level of Independence: Independent  Comments: Pt independent with ADL, IADL, Nature conservation officermotorcycle  mechanic.  Currently not working.  Functional Status:  Mobility: Bed Mobility Overal bed mobility: Needs Assistance Bed Mobility: Supine to Sit Supine to sit: HOB elevated, Min assist, Mod assist Sit to supine: Mod assist General bed mobility comments: vc's to use R LE to scoot hips to L towards edge of bed (close SBA for safety); vc's to use R UE to assist for bed mobility; mod assist for L LE and for trunk supine to sit Transfers Overall transfer level: Needs assistance Transfers: Sit to/from Stand Sit to Stand: Mod assist, +2 physical assistance General transfer comment: assist for balance and to block L knee (to prevent buckling) sit to/from stand; R hand hold assist with standing; vc's to shift weight to R for upright posture (pushing some to L) Ambulation/Gait Ambulation/Gait assistance: Mod assist, Max assist, +2 physical assistance Ambulation Distance (Feet): 3 Feet Assistive device: None General Gait Details: pt able to side step to L along edge of bed and turn to position self in front of chair (ambulating a few feet) with mod to max assist x2; L knee blocked to prevent buckling; vc's and assist for upright posture (d/t some pushing to L; also intermittent flexed posture but anticipate d/t pt looking down to take steps); pt able to move L LE on own short distance laterally and forward to take steps; vc's for L quad set (with L knee blocked) before taking a step with R LE Gait velocity: decreased Stairs:  (not appropriate at this time)    ADL: ADL Overall ADL's : Needs assistance/impaired Eating/Feeding: Set up, Bed level Grooming: Bed level, Set up Upper Body Bathing: Moderate assistance, Bed level Lower Body Bathing: Moderate assistance, Bed level Upper Body Dressing : Moderate assistance, Bed level Lower Body Dressing: Maximal assistance, Bed level Toilet Transfer Details (indicate cue type and reason): deferred due to safety General ADL Comments: pt generally mod- max  for self care tasks due to significant hemiplegia on L side  Cognition: Cognition Overall Cognitive Status: Within Functional Limits for tasks assessed Arousal/Alertness: Awake/alert Orientation Level: Oriented X4 Attention: Focused, Sustained Focused Attention: Appears intact Sustained Attention: Appears intact Memory: Appears intact Awareness: Appears intact Problem Solving: Appears intact Executive Function: Reasoning, Sequencing, Decision Making Reasoning: Appears intact Sequencing: Appears intact Decision Making: Appears intact (grossly) Behaviors: Restless, Impulsive (min) Safety/Judgment: Appears intact (grossly) Cognition Arousal/Alertness: Awake/alert Behavior During Therapy: Impulsive (Impulsivity noted) Overall Cognitive Status: Within Functional Limits for tasks assessed General Comments: Decreased insight into deficits  Physical Exam: Blood pressure (!) 153/86, pulse 72, temperature 98.4 F (36.9 C), temperature source Oral, resp. rate 16, height 6\' 2"  (1.88 m), weight 102.1 kg (225 lb), SpO2 99 %. Physical Exam  Vitals reviewed. Constitutional: He is oriented to person, place, and time. He appears well-developed and well-nourished.  41 year old right handed African-American male  HENT:  Head: Normocephalic and atraumatic.  Eyes: EOM are normal. Right eye exhibits no discharge. Left eye exhibits no discharge.  Neck: Normal range of motion. Neck supple. No thyromegaly present.  Cardiovascular: Normal rate, regular rhythm and normal heart sounds.   Respiratory: Effort normal and breath sounds normal. No respiratory distress.  GI: Soft. Bowel sounds are normal. He exhibits no distension.  Musculoskeletal: He exhibits edema. He exhibits no tenderness.  Neurological: He is alert and oriented to person, place, and time.  Follows commands.  Speech is dysarthric but intelligible.  Fair awareness of deficits. Motor: RUE/RLE 5/5 proximal to distal LUE/LLE: 0/5 proximal  to distal Left facial weakness with left tongue deviation DTRs symmetric  Skin: Skin is warm and dry.  Psychiatric: He has a normal mood and affect. His behavior is normal. Thought content normal.    Results for orders placed or performed during the hospital encounter of 02/01/17 (from the past 48 hour(s))  Glucose, capillary     Status: Abnormal   Collection Time: 02/01/17  5:31 PM  Result Value Ref Range   Glucose-Capillary 262 (H) 65 - 99 mg/dL  Glucose, capillary     Status: Abnormal   Collection Time: 02/01/17  9:30 PM  Result Value Ref Range   Glucose-Capillary 182 (H) 65 - 99 mg/dL  HIV antibody     Status: None   Collection Time: 02/02/17  4:54 AM  Result Value Ref Range   HIV Screen 4th Generation wRfx Non Reactive Non Reactive    Comment: (NOTE) Performed At: [facility] [address] [place], [place] [id number] Mila HomerHancock William F MD NW:[phone]   Lipid panel     Status: Abnormal   Collection Time: 02/02/17  4:54 AM  Result Value Ref Range   Cholesterol 190 0 - 200 mg/dL   Triglycerides 578354 (H) <150 mg/dL   HDL 29 (L) >46>40 mg/dL   Total CHOL/HDL Ratio 6.6 RATIO   VLDL 71 (H) 0 - 40 mg/dL   LDL Cholesterol 90 0 - 99 mg/dL    Comment:        Total Cholesterol/HDL:CHD Risk Coronary Heart Disease Risk Table                     Men   Women  1/2 Average Risk   3.4   3.3  Average Risk       5.0   4.4  2 X Average Risk   9.6   7.1  3 X Average Risk  23.4   11.0        Use the calculated Patient Ratio above and the CHD Risk Table to determine the patient's CHD Risk.        ATP III CLASSIFICATION (LDL):  <100     mg/dL   Optimal  [id number]  mg/dL   Near or Above                    Optimal  130-159  mg/dL   Borderline  [id number]  mg/dL   High  >401>190     mg/dL   Very High   Hemoglobin A1c     Status: Abnormal   Collection Time: 02/02/17  4:54 AM  Result Value Ref Range   Hgb A1c MFr Bld 9.9 (H) 4.8 - 5.6 %    Comment: (NOTE)         Pre-diabetes: 5.7 -  6.4         Diabetes: >6.4         Glycemic control for adults with diabetes: <7.0    Mean Plasma Glucose 237 mg/dL    Comment: (NOTE) Performed At: [facility] [address] [place], [place] [id number] Mila HomerHancock William F MD QI:[phone]   Glucose, capillary     Status: Abnormal   Collection Time: 02/02/17  7:34 AM  Result Value Ref Range   Glucose-Capillary 216 (H) 65 - 99 mg/dL  Glucose, capillary     Status: Abnormal   Collection Time: 02/02/17 11:46 AM  Result Value Ref Range   Glucose-Capillary 198 (H) 65 - 99 mg/dL  Glucose, capillary  Status: Abnormal   Collection Time: 02/02/17  4:43 PM  Result Value Ref Range   Glucose-Capillary 221 (H) 65 - 99 mg/dL  Glucose, capillary     Status: Abnormal   Collection Time: 02/02/17  8:02 PM  Result Value Ref Range   Glucose-Capillary 192 (H) 65 - 99 mg/dL  Glucose, capillary     Status: Abnormal   Collection Time: 02/03/17  7:41 AM  Result Value Ref Range   Glucose-Capillary 128 (H) 65 - 99 mg/dL   Ct Angio Head W Or Wo Contrast  Result Date: 02/01/2017 CLINICAL DATA:  Left-sided weakness and slurred speech. EXAM: CT ANGIOGRAPHY HEAD AND NECK TECHNIQUE: Multidetector CT imaging of the head and neck was performed using the standard protocol during bolus administration of intravenous contrast. Multiplanar CT image reconstructions and MIPs were obtained to evaluate the vascular anatomy. Carotid stenosis measurements (when applicable) are obtained utilizing NASCET criteria, using the distal internal carotid diameter as the denominator. CONTRAST:  75 mL Isovue 370 COMPARISON:  None. FINDINGS: CTA NECK FINDINGS Aortic arch: Standard 3 vessel aortic arch. Brachiocephalic and subclavian arteries are widely patent. Right carotid system: Patent without evidence of stenosis, dissection, or significant atherosclerosis. Left carotid system: Patent without evidence of stenosis, dissection, or significant atherosclerosis. Vertebral arteries:  Patent and codominant without evidence of stenosis, dissection, or significant atherosclerosis. Skeleton: Mild disc space narrowing and moderate degenerative endplate spurring at C5-6. Other neck: No mass or lymph node enlargement. Poor dentition with multiple caries. Upper chest: Clear lung apices. Review of the MIP images confirms the above findings CTA HEAD FINDINGS Anterior circulation: The internal carotid arteries are widely patent from skullbase to carotid termini. The ACAs and MCAs are patent without evidence of proximal branch occlusion or significant proximal stenosis. Diffuse branch vessel irregularity is attributed to technique. No aneurysm. Posterior circulation: The intracranial vertebral arteries are widely patent to the basilar. Right PICA, left AICA, and bilateral SCA origins are patent. The basilar artery is widely patent. There are patent posterior communicating arteries, right larger than left. The right P1 segment is hypoplastic. PCAs are patent with mild diffuse irregularity attributed to technique. No evidence of flow limiting proximal stenosis. No aneurysm. Venous sinuses: Patent. Anatomic variants: Predominantly fetal contribution to the right PCA. Delayed phase: No abnormal enhancement. Review of the MIP images confirms the above findings IMPRESSION: Negative head and neck CTA. No large vessel occlusion or significant proximal stenosis. Electronically Signed   By: Sebastian Ache M.D.   On: 02/01/2017 11:49   Ct Angio Neck W And/or Wo Contrast  Result Date: 02/01/2017 CLINICAL DATA:  Left-sided weakness and slurred speech. EXAM: CT ANGIOGRAPHY HEAD AND NECK TECHNIQUE: Multidetector CT imaging of the head and neck was performed using the standard protocol during bolus administration of intravenous contrast. Multiplanar CT image reconstructions and MIPs were obtained to evaluate the vascular anatomy. Carotid stenosis measurements (when applicable) are obtained utilizing NASCET criteria,  using the distal internal carotid diameter as the denominator. CONTRAST:  75 mL Isovue 370 COMPARISON:  None. FINDINGS: CTA NECK FINDINGS Aortic arch: Standard 3 vessel aortic arch. Brachiocephalic and subclavian arteries are widely patent. Right carotid system: Patent without evidence of stenosis, dissection, or significant atherosclerosis. Left carotid system: Patent without evidence of stenosis, dissection, or significant atherosclerosis. Vertebral arteries: Patent and codominant without evidence of stenosis, dissection, or significant atherosclerosis. Skeleton: Mild disc space narrowing and moderate degenerative endplate spurring at C5-6. Other neck: No mass or lymph node enlargement. Poor dentition with multiple caries.  Upper chest: Clear lung apices. Review of the MIP images confirms the above findings CTA HEAD FINDINGS Anterior circulation: The internal carotid arteries are widely patent from skullbase to carotid termini. The ACAs and MCAs are patent without evidence of proximal branch occlusion or significant proximal stenosis. Diffuse branch vessel irregularity is attributed to technique. No aneurysm. Posterior circulation: The intracranial vertebral arteries are widely patent to the basilar. Right PICA, left AICA, and bilateral SCA origins are patent. The basilar artery is widely patent. There are patent posterior communicating arteries, right larger than left. The right P1 segment is hypoplastic. PCAs are patent with mild diffuse irregularity attributed to technique. No evidence of flow limiting proximal stenosis. No aneurysm. Venous sinuses: Patent. Anatomic variants: Predominantly fetal contribution to the right PCA. Delayed phase: No abnormal enhancement. Review of the MIP images confirms the above findings IMPRESSION: Negative head and neck CTA. No large vessel occlusion or significant proximal stenosis. Electronically Signed   By: Sebastian Ache M.D.   On: 02/01/2017 11:49   Mr Brain Wo  Contrast  Result Date: 02/01/2017 CLINICAL DATA:  Left-sided weakness and slurred speech. EXAM: MRI HEAD WITHOUT CONTRAST TECHNIQUE: Multiplanar, multiecho pulse sequences of the brain and surrounding structures were obtained without intravenous contrast. COMPARISON:  Head CT earlier today FINDINGS: The patient terminated the examination prematurely. A coronal T2 sequence was not obtained. Brain: There is a 1 cm acute infarct in the posterior limb of the right internal capsule. No intracranial hemorrhage, mass, midline shift, or extra-axial fluid collection is identified. There is focal encephalomalacia involving the posterior body of the corpus callosum bilaterally. T2 shine through is noted in this location on diffusion imaging. There is also a subcentimeter focus of encephalomalacia in the right subinsular white matter. The ventricles and sulci are normal in size. Vascular: Major intracranial vascular flow voids are preserved. Skull and upper cervical spine: Unremarkable bone marrow signal. Sinuses/Orbits: Unremarkable orbits. Minimal right maxillary sinus mucosal thickening. No significant mastoid fluid. Other: None. IMPRESSION: 1. 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 2. 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 Electronically Signed   By: Sebastian Ache M.D.   On: 02/01/2017 14:12    Medical Problem List and Plan: 1.   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  DVT Prophylaxis/Anticoagulation:  Subcutaneous Lovenox. Monitor platelet counts and any signs of bleeding 3. Pain Management:   Oxycodone as needed 4. Mood:  Provide emotional support 5. Neuropsych: This patient  is capable of making decisions on  his own behalf. 6. Skin/Wound Care:  Routine skin checks 7. Fluids/Electrolytes/Nutrition:  Routine I&O with follow-up chemistries 8.Diabetes mellitus and  peripheral neuropathy. Hemoglobin A1c 9.9. Lantus insulin 15 units daily, Glucotrol 10 mg daily. Provide diabetic teaching. Check blood sugars before meals and at bedtime 9. Hypertension. Lisinopril 20 mg daily. Monitor with increased mobility 10. Tobacco/marijuana abuse. Nicoderm patch. Provide counseling 11. Hyperlipidemia. Lipitor 12.  QuestionMedical noncompliance. Provide counseling  Post Admission Physician Evaluation: 1. Preadmission assessment reviewed and changes made below. 2. Functional deficits secondary  to right internal capsule infarction. 3. Patient is admitted to receive collaborative, interdisciplinary care between the physiatrist, rehab nursing staff, and therapy team. 4. Patient's level of medical complexity and substantial therapy needs in context of that medical necessity cannot be provided at a lesser intensity of care such as a SNF. 5. Patient has experienced substantial functional loss from his/her baseline which  was documented above under the "Functional History" and "Functional Status" headings.  Judging by the patient's diagnosis, physical exam, and functional history, the patient has potential for functional progress which will result in measurable gains while on inpatient rehab.  These gains will be of substantial and practical use upon discharge  in facilitating mobility and self-care at the household level. 6. Physiatrist will provide 24 hour management of medical needs as well as oversight of the therapy plan/treatment and provide guidance as appropriate regarding the interaction of the two. 7. The Preadmission Screening has been reviewed and patient status is unchanged unless otherwise stated above. 8. 24 hour rehab nursing will assist with safety, disease management and patient education  and help integrate therapy concepts, techniques,education, etc. 9. PT will assess and treat for/with: Lower extremity strength, range of motion, stamina, balance, functional  mobility, safety, adaptive techniques and equipment, coping skills, pain control, stroke education.   Goals are: Min A/Supervision. 10. OT will assess and treat for/with: ADL's, functional mobility, safety, upper extremity strength, adaptive techniques and equipment, wound mgt, ego support, and community reintegration.   Goals are: Min A/Supervision. Therapy may proceed with showering this patient. 11. SLP will assess and treat for/with: speech.  Goals are: Mod I. 12. Case Management and Social Worker will assess and treat for psychological issues and discharge planning. 13. Team conference will be held weekly to assess progress toward goals and to determine barriers to discharge. 14. Patient will receive at least 3 hours of therapy per day at least 5 days per week. 15. ELOS: 14-18 days.        16. Prognosis:  good  Maryla Morrow, MD, [place] Dom Charlton Amor., PA-C 02/03/2017

## 2017-02-04 NOTE — Care Management (Signed)
Discharge [facility] per Dr.Kalisetti. Transportation will be arranged per [facility]. Gwenette GreetBrenda S Jaielle Dlouhy RN MSN CCM Care Management [phone]

## 2017-02-04 NOTE — Procedures (Signed)
Transesophageal Echocardiogram :  Indication: CVA Requesting/ordering  physician: Dr. Nemiah CommanderKalisetti  Procedure: Benzocaine spray x2 and 2 mls x 2 of viscous lidocaine were given orally to provide local anesthesia to the oropharynx. The patient was positioned supine on the left side, bite block provided. The patient was moderately sedated with the doses of versed and fentanyl as detailed below.  Using digital technique an omniplane probe was advanced into the distal esophagus without incident.   Moderate sedation: 1. Sedation used:  Versed: 5 mg, Fentanyl: 125 ug 2. Time administered:  8:30 Am   Time when patient started recovery: 9:00 Am 3. I was face to face during this time  See report in EPIC  for complete details: In brief, transgastric imaging revealed normal LV function with no RWMAs and no mural apical thrombus.    Estimated ejection fraction was 55%.  Right sided cardiac chambers were normal with no evidence of pulmonary hypertension.  Imaging of the septum showed no ASD or VSD Bubble study was negative for shunt 2D and color flow confirmed no PFO  The LA was well visualized in orthogonal views.  There was no spontaneous contrast and no thrombus in the LA and LA appendage   The descending thoracic aorta had no  mural aortic debris with no evidence of aneurysmal dilation or disection   Julien Nordmannimothy Maralyn Witherell , MD [facility] 02/04/2017 9:05 AM

## 2017-02-05 ENCOUNTER — Inpatient Hospital Stay (HOSPITAL_COMMUNITY): Payer: Self-pay | Admitting: Physical Therapy

## 2017-02-05 ENCOUNTER — Encounter (HOSPITAL_COMMUNITY): Payer: Self-pay

## 2017-02-05 ENCOUNTER — Inpatient Hospital Stay (HOSPITAL_COMMUNITY): Payer: Medicaid Other | Admitting: Speech Pathology

## 2017-02-05 ENCOUNTER — Inpatient Hospital Stay (HOSPITAL_COMMUNITY): Payer: Self-pay | Admitting: Occupational Therapy

## 2017-02-05 DIAGNOSIS — G8194 Hemiplegia, unspecified affecting left nondominant side: Secondary | ICD-10-CM

## 2017-02-05 DIAGNOSIS — I6300 Cerebral infarction due to thrombosis of unspecified precerebral artery: Secondary | ICD-10-CM

## 2017-02-05 LAB — COMPREHENSIVE METABOLIC PANEL
ALK PHOS: 45 U/L (ref 38–126)
ALT: 15 U/L — ABNORMAL LOW (ref 17–63)
ANION GAP: 4 — AB (ref 5–15)
AST: 20 U/L (ref 15–41)
Albumin: 2.7 g/dL — ABNORMAL LOW (ref 3.5–5.0)
BILIRUBIN TOTAL: 0.5 mg/dL (ref 0.3–1.2)
BUN: 16 mg/dL (ref 6–20)
CALCIUM: 8.5 mg/dL — AB (ref 8.9–10.3)
CO2: 27 mmol/L (ref 22–32)
Chloride: 107 mmol/L (ref 101–111)
Creatinine, Ser: 1.19 mg/dL (ref 0.61–1.24)
GFR calc Af Amer: >60 mL/min (ref >60)
GLUCOSE: 147 mg/dL — AB (ref 65–99)
Potassium: 3.8 mmol/L (ref 3.5–5.1)
Sodium: 138 mmol/L (ref 135–145)
TOTAL PROTEIN: 6 g/dL — AB (ref 6.5–8.1)

## 2017-02-05 LAB — CBC WITH DIFFERENTIAL/PLATELET
BASOS PCT: 0 %
Basophils Absolute: 0 10*3/uL (ref 0.0–0.1)
Eosinophils Absolute: 0.4 10*3/uL (ref 0.0–0.7)
Eosinophils Relative: 6 %
HEMATOCRIT: 35 % — AB (ref 39.0–52.0)
HEMOGLOBIN: 12.5 g/dL — AB (ref 13.0–17.0)
LYMPHS PCT: 36 %
Lymphs Abs: 2.3 10*3/uL (ref 0.7–4.0)
MCH: 32.1 pg (ref 26.0–34.0)
MCHC: 35.7 g/dL (ref 30.0–36.0)
MCV: 90 fL (ref 78.0–100.0)
MONO ABS: 0.8 10*3/uL (ref 0.1–1.0)
MONOS PCT: 12 %
NEUTROS ABS: 2.9 10*3/uL (ref 1.7–7.7)
NEUTROS PCT: 46 %
Platelets: 212 10*3/uL (ref 150–400)
RBC: 3.89 MIL/uL — ABNORMAL LOW (ref 4.22–5.81)
RDW: 12.4 % (ref 11.5–15.5)
WBC: 6.4 10*3/uL (ref 4.0–10.5)

## 2017-02-05 LAB — GLUCOSE, CAPILLARY
Glucose-Capillary: 121 mg/dL — ABNORMAL HIGH (ref 65–99)
Glucose-Capillary: 133 mg/dL — ABNORMAL HIGH (ref 65–99)
Glucose-Capillary: 139 mg/dL — ABNORMAL HIGH (ref 65–99)
Glucose-Capillary: 116 mg/dL — ABNORMAL HIGH (ref 65–99)

## 2017-02-05 NOTE — Evaluation (Addendum)
Speech Language Pathology Assessment and Plan  Patient Details  Name: Stanley Farley MRN: [id number] Date of Birth: 03/11/1976  Evaluation Only  Today's Date: 02/05/2017 SLP Individual Time: 1300-1400     Problem List:  Patient Active Problem List   Diagnosis Date Noted  . 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 Poorly controlled type 2 diabetes mellitus with peripheral neuropathy (HCC)   . Benign essential HTN   . Tobacco abuse   . Marijuana abuse   . Hyperlipidemia   . H/O medication noncompliance   . Acute CVA (cerebrovascular accident) (HCC) 02/01/2017   Past Medical History:  Past Medical History:  Diagnosis Date  . Diabetes mellitus without complication (HCC)   . Hypertension    Past Surgical History:  Past Surgical History:  Procedure Laterality Date  . TEE WITHOUT CARDIOVERSION N/A 02/04/2017   Procedure: TRANSESOPHAGEAL ECHOCARDIOGRAM (TEE);  Surgeon: Gollan, Timothy J, MD;  Location: [facility] ORS;  Service: Cardiovascular;  Laterality: N/A;    Assessment / Plan / Recommendation Clinical Impression 40-year-old right-handed male with history of hypertension and diabetes mellitus as well as tobacco/Marijuana use. As per chart review patient, lives with spouse. 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 One level home with 2 steps to entry. Presented to [facility]/01/2017 with left-sided weakness, slurred speech as well as intermittent nausea and vomiting. Cranial CT reviewed, negative. CTA of head and neck no large vessel occlusion or stenosis. 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 Patient did not receive TPA. Echocardiogram with ejection fraction of 55%. Systolic function normal and no  intracardiac source of emboli. TEE showed estimated ejection fraction of 55%. No apical thrombus or PFO. Bubble study was negative for shunt. Presently maintained on aspirin for CVA prophylaxis.Subcutaneous Lovenox for DVT prophylaxis.Hemoglobin A1c of 9.9. Patient had not been taking his oral agent or insulin for the past month due to bouts of hypoglycemia versus medical noncompliance. Nicoderm patch initiated for tobacco abuse. Tolerating a regular consistency diet. Physical and occupational therapy evaluations completed with recommendations of physical medicine rehabilitation consult.   Patient was admitted for a comprehensive rehabilitation program on 02/04/17. Speech-language evaluation completed. Although pt continues to present with mild left facial weakness, it no longer impacts his speech intelligibility or ability to consume regular diet textures. Pt is ~ 100% intelligible at the complex conversation for unknown information. Pt states that he is able to be understood with talking iwth people over the phone. Pt's cognitive linguistic abilities continue to be within functional limits as previously assessed. Skilled ST is not indicated at this time. All education completed.    Skilled Therapeutic Interventions          Skilled treatment session focused on completion of speech language evaluation, see above. Pt states that he has experienced great progress with his speech intelligibility and is currently ~ 100% intelligible at the complex conversation level. He is agreeable that ST services are no longer indicated and all questions answered to pt satisfaction.    SLP Assessment  Patient does not need any further Speech Lanaguage Pathology Services    Recommendations  Patient destination: Home Follow up Recommendations: None Equipment Recommended: None recommended by SLP                       Pain Pain Assessment Pain Assessment: No/denies pain Pain Score: 0-No pain  Prior  Functioning Cognitive/Linguistic Baseline: Within functional limits Type of Home: House  Lives With: Spouse;Family Available Help at Discharge: Family;Available 24 hours/day Education: HS Vocation: Full time employment  Function:  Eating Eating     Eating Assist Level: More than reasonable amount of time           Cognition Comprehension Comprehension assist level: Follows complex conversation/direction with no assist  Expression   Expression assist level: Expresses complex ideas: With extra time/assistive device  Social Interaction Social Interaction assist level: Interacts appropriately with others - No medications needed.  Problem Solving Problem solving assist level: Solves complex problems: Recognizes & self-corrects  Memory Memory assist level: Complete Independence: No helper   Short Term Goals: No short term goals set  Refer to Care Plan for Long Term Goals  Recommendations for other services: None   Discharge Criteria: Patient will be discharged from SLP if patient refuses treatment 3 consecutive times without medical reason, if treatment goals not met, if there is a change in medical status, if patient makes no progress towards goals or if patient is discharged from hospital.  The above assessment, treatment plan, treatment alternatives and goals were discussed and mutually agreed upon: by patient  Audwin Semper 02/05/2017, 1:39 PM

## 2017-02-05 NOTE — Progress Notes (Signed)
Subjective/Complaints: Discussed smoking cessation ROS- neg CP/SOB, - N/V/D Objective: Vital Signs: Blood pressure 140/75, pulse 69, temperature 97.7 F (36.5 C), temperature source Axillary, resp. rate 18, height '6\' 2"'  (1.88 m), weight 97.5 kg (215 lb), SpO2 99 %. No results found. Results for orders placed or performed during the hospital encounter of 02/04/17 (from the past 72 hour(s))  CBC     Status: Abnormal   Collection Time: 02/04/17  4:19 PM  Result Value Ref Range   WBC 6.1 4.0 - 10.5 K/uL   RBC 3.96 (L) 4.22 - 5.81 MIL/uL   Hemoglobin 12.8 (L) 13.0 - 17.0 g/dL   HCT 35.9 (L) 39.0 - 52.0 %   MCV 90.7 78.0 - 100.0 fL   MCH 32.3 26.0 - 34.0 pg   MCHC 35.7 30.0 - 36.0 g/dL   RDW 12.5 11.5 - 15.5 %   Platelets 203 150 - 400 K/uL  Creatinine, serum     Status: Abnormal   Collection Time: 02/04/17  4:19 PM  Result Value Ref Range   Creatinine, Ser 1.38 (H) 0.61 - 1.24 mg/dL   GFR calc non Af Amer >60 >60 mL/min   GFR calc Af Amer >60 >60 mL/min    Comment: (NOTE) The eGFR has been calculated using the CKD EPI equation. This calculation has not been validated in all clinical situations. eGFR's persistently <60 mL/min signify possible Chronic Kidney Disease.   Glucose, capillary     Status: Abnormal   Collection Time: 02/04/17  5:01 PM  Result Value Ref Range   Glucose-Capillary 105 (H) 65 - 99 mg/dL  Glucose, capillary     Status: Abnormal   Collection Time: 02/04/17  9:09 PM  Result Value Ref Range   Glucose-Capillary 199 (H) 65 - 99 mg/dL  CBC WITH DIFFERENTIAL     Status: Abnormal   Collection Time: 02/05/17  5:08 AM  Result Value Ref Range   WBC 6.4 4.0 - 10.5 K/uL   RBC 3.89 (L) 4.22 - 5.81 MIL/uL   Hemoglobin 12.5 (L) 13.0 - 17.0 g/dL   HCT 35.0 (L) 39.0 - 52.0 %   MCV 90.0 78.0 - 100.0 fL   MCH 32.1 26.0 - 34.0 pg   MCHC 35.7 30.0 - 36.0 g/dL   RDW 12.4 11.5 - 15.5 %   Platelets 212 150 - 400 K/uL   Neutrophils Relative % 46 %   Neutro Abs 2.9 1.7 - 7.7  K/uL   Lymphocytes Relative 36 %   Lymphs Abs 2.3 0.7 - 4.0 K/uL   Monocytes Relative 12 %   Monocytes Absolute 0.8 0.1 - 1.0 K/uL   Eosinophils Relative 6 %   Eosinophils Absolute 0.4 0.0 - 0.7 K/uL   Basophils Relative 0 %   Basophils Absolute 0.0 0.0 - 0.1 K/uL  Comprehensive metabolic panel     Status: Abnormal   Collection Time: 02/05/17  5:08 AM  Result Value Ref Range   Sodium 138 135 - 145 mmol/L   Potassium 3.8 3.5 - 5.1 mmol/L   Chloride 107 101 - 111 mmol/L   CO2 27 22 - 32 mmol/L   Glucose, Bld 147 (H) 65 - 99 mg/dL   BUN 16 6 - 20 mg/dL   Creatinine, Ser 1.19 0.61 - 1.24 mg/dL   Calcium 8.5 (L) 8.9 - 10.3 mg/dL   Total Protein 6.0 (L) 6.5 - 8.1 g/dL   Albumin 2.7 (L) 3.5 - 5.0 g/dL   AST 20 15 - 41 U/L  ALT 15 (L) 17 - 63 U/L   Alkaline Phosphatase 45 38 - 126 U/L   Total Bilirubin 0.5 0.3 - 1.2 mg/dL   GFR calc non Af Amer >60 >60 mL/min   GFR calc Af Amer >60 >60 mL/min    Comment: (NOTE) The eGFR has been calculated using the CKD EPI equation. This calculation has not been validated in all clinical situations. eGFR's persistently <60 mL/min signify possible Chronic Kidney Disease.    Anion gap 4 (L) 5 - 15  Glucose, capillary     Status: Abnormal   Collection Time: 02/05/17  6:57 AM  Result Value Ref Range   Glucose-Capillary 121 (H) 65 - 99 mg/dL     HEENT: normal Cardio: RRR and no murmur Resp: CTA B/L and unlabored GI: BS positive and NT, ND Extremity:  No Edema Skin:   Intact Neuro: Alert/Oriented, Normal Sensory, Abnormal Motor 2- Left tricep o/w 0/5 in LUE, 3- Left HF and KE , 0 ADF, Abnormal FMC Ataxic/ dec FMC and Dysarthric Musc/Skel:  Other no pain with UE or LE ROM Gen NAD   Assessment/Plan: 1. Functional deficits secondary to Left hemiparesis Right Int capsule infarct which require 3+ hours per day of interdisciplinary therapy in a comprehensive inpatient rehab setting. Physiatrist is providing close team supervision and 24 hour  management of active medical problems listed below. Physiatrist and rehab team continue to assess barriers to discharge/monitor patient progress toward functional and medical goals. FIM:       Function - Toileting Toileting activity did not occur: Safety/medical concerns  Function Midwife transfer activity did not occur: Safety/medical concerns        Function - Comprehension Comprehension: Auditory Comprehension assist level: Follows complex conversation/direction with no assist  Function - Expression Expression: Verbal Expression assist level: Expresses complex ideas: With no assist  Function - Social Interaction Social Interaction assist level: Interacts appropriately with others - No medications needed.  Function - Problem Solving Problem solving assist level: Solves complex problems: Recognizes & self-corrects  Function - Memory Memory assist level: Complete Independence: No helper Patient normally able to recall (first 3 days only): Current season, Location of own room, Staff names and faces, That he or she is in a hospital  Medical Problem List and Plan: 1.   Left-sided weakness and dysarthria secondary to  right internal capsule infarction CIR PT, OT ,SLP evals 2.  DVT Prophylaxis/Anticoagulation:  Subcutaneous Lovenox. Monitor platelet counts and any signs of bleeding 3. Pain Management:   Oxycodone as needed 4. Mood:  Provide emotional support 5. Neuropsych: This patient  is capable of making decisions on  his own behalf. 6. Skin/Wound Care:  Routine skin checks 7. Fluids/Electrolytes/Nutrition:  Routine I&O with follow-up chemistries 8.Diabetes mellitus and peripheral neuropathy. Hemoglobin A1c 9.9. Lantus insulin 15 units daily, Glucotrol 10 mg daily. Provide diabetic teaching. Check blood sugars before meals and at bedtime CBG (last 3)   Recent Labs  02/04/17 1701 02/04/17 2109 02/05/17 0657  GLUCAP 105* 199* 121*    9.  Hypertension. Lisinopril 20 mg daily. Monitor with increased mobility Vitals:   02/04/17 1545 02/05/17 0444  BP: (!) 158/88 140/75  Pulse: 67 69  Resp: 17 18  Temp: 98 F (36.7 C) 97.7 F (36.5 C)   10. Tobacco/marijuana abuse. Nicoderm patch. Provide counseling 11. Hyperlipidemia. Lipitor 12.  QuestionMedical noncompliance. Provide counseling  LOS (Days) 1 A FACE TO FACE EVALUATION WAS PERFORMED  Kaycie Pegues E 02/05/2017, 9:19 AM

## 2017-02-05 NOTE — Evaluation (Signed)
Physical Therapy Assessment and Plan  Patient Details  Name: Stanley Farley MRN: [id number] Date of Birth: 1975/07/22  PT Diagnosis: Abnormality of gait, Coordination disorder, Difficulty walking, Hemiparesis non-dominant and Muscle weakness Rehab Potential: Excellent ELOS: 18-21 days   Today's Date: 02/05/2017 PT Individual Time: 1100-1200 PT Individual Time Calculation (min): 60 min    Problem List:  Patient Active Problem List   Diagnosis Date Noted  . Thrombotic cerebral infarction ([place]) 02/04/2017  . 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 Poorly controlled type 2 diabetes mellitus with peripheral neuropathy ([place])   . Benign essential HTN   . Tobacco abuse   . Marijuana abuse   . Hyperlipidemia   . H/O medication noncompliance   . Acute CVA (cerebrovascular accident) ([place]) 02/01/2017    Past Medical History:  Past Medical History:  Diagnosis Date  . Diabetes mellitus without complication ([place])   . Hypertension    Past Surgical History:  Past Surgical History:  Procedure Laterality Date  . TEE WITHOUT CARDIOVERSION N/A 02/04/2017   Procedure: TRANSESOPHAGEAL ECHOCARDIOGRAM (TEE);  Surgeon: Minna Merritts, MD;  Location: [facility] ORS;  Service: Cardiovascular;  Laterality: N/A;    Assessment & Plan Clinical Impression: Emeril Farley is 41 year old right-handed male with history of hypertension and diabetes mellitus as well as tobacco abuse. Per patient report and chart review patient lives with spouse and 3 children ages 82, 71, & 64. 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 He lives in a one level home with 2 steps to enter. Presented to [facility] 02/01/2017 with left-sided weakness, slurred speech as well as intermittent nausea and vomiting. Cranial CT scan negative. CTA of head and neck showed no large vessel occlusion or stenosis. MRI showed acute infarct in the  posterior limb of the right internal capsule. Small chronic infarctions of the remote insults in the corpus colostrum bilaterally and right subinsular region.Patient did not receive TPA. Echocardiogram with ejection fraction of 55%. Systolic function normal and no intracardiac source of emboli. Presently maintained on aspirin for CVA prophylaxis. Subcutaneous Lovenox for DVT prophylaxis. Nicoderm patch initiated for tobacco abuse. Tolerating a regular consistency diet. Physical and occupational therapy evaluations completed with recommendations of physical medicine rehabilitation consult. Patient was admitted for a comprehensive rehabilitation program 02/04/17. Patient transferred to CIR on 02/04/2017 .   Patient currently requires max with mobility secondary to muscle weakness, decreased cardiorespiratoy endurance, impaired timing and sequencing, abnormal tone, unbalanced muscle activation and decreased coordination and decreased sitting balance, decreased standing balance, decreased postural control, hemiplegia and decreased balance strategies.  Prior to hospitalization, patient was independent  with mobility and lived with Spouse, Family (3 children) in a House home.  Home access is 2 steps with bilateral handrails at back door; 15 steps at front doorStairs to enter.  Patient will benefit from skilled PT intervention to maximize safe functional mobility, minimize fall risk and decrease caregiver burden for planned discharge home with 24 hour supervision.  Anticipate patient will benefit from follow up [place] at discharge.  PT - End of Session Activity Tolerance: Tolerates 30+ min activity with multiple rests Endurance Deficit: Yes PT Assessment Rehab Potential (ACUTE/IP ONLY): Excellent PT Barriers to Discharge: Home environment access/layout PT Barriers to Discharge Comments: 3 steps with bilateral handrail to enter home PT Patient demonstrates impairments in the following area(s):  Balance;Endurance;Motor;Safety PT Transfers Functional Problem(s): Bed Mobility;Bed to Chair;Car;Furniture PT Locomotion Functional Problem(s): Ambulation;Stairs;Wheelchair  Mobility PT Plan PT Intensity: Minimum of 1-2 x/day ,45 to 90 minutes PT Frequency: 5 out of 7 days PT Duration Estimated Length of Stay: 18-21 days PT Treatment/Interventions: Ambulation/gait training;Community reintegration;DME/adaptive equipment instruction;Neuromuscular re-education;Stair training;UE/LE Strength taining/ROM;Wheelchair propulsion/positioning;Balance/vestibular training;Discharge planning;Functional electrical stimulation;Therapeutic Activities;UE/LE Coordination activities;Functional mobility training;Patient/family education;Splinting/orthotics;Therapeutic Exercise PT Transfers Anticipated Outcome(s): mod-I for bed <> chair transfers PT Locomotion Anticipated Outcome(s): supervision ambulation with LRAD PT Recommendation Recommendations for Other Services: Therapeutic Recreation consult Therapeutic Recreation Interventions: Stress management;Outing/community reintergration Follow Up Recommendations: Home health PT;24 hour supervision/assistance Patient destination: Home Equipment Recommended: To be determined  Skilled Therapeutic Intervention No c/o pain. PT treatment session focused on evaluation, pt education on therapy goals, discharge planning, ELOS, and plan of care. Pt education on rolling to L to come to sitting with proper sequencing of LE and UE hand placement for support requiring mod A for steadying of trunk. Pt ambulated at hallway rail 20f with max A for balance and L LE stability in stance progressing to ambulation 367fwith hemiwalker with max A for balance and L LE stability in stance. Pt eager to learn throughout session. Pt education on w/c management and proper UE and LE placement with transfers with pt demonstrating learning. Pt propelled w/c [id number] room using R hemi-technique with  supervision. Pt left supine in bed with call bell in reach and bed alarm on. Pt performed mobility tasks throughout session with the below listed levels of assistance.    PT Evaluation Precautions/Restrictions Precautions Precautions: Fall Restrictions Weight Bearing Restrictions: No Home Living/Prior Functioning Home Living Available Help at Discharge: Family;Available 24 hours/day Type of Home: House Home Access: Stairs to enter [employer] Steps: 2 steps with bilateral handrails at back door; 15 steps at front door Entrance Stairs-Rails: Right;Left;Can reach both Home Layout: One level  Lives With: Spouse;Family (3 children) Prior Function Level of Independence: Independent with basic ADLs;Independent with homemaking with ambulation;Independent with gait;Independent with transfers  Able to Take Stairs?: Yes Driving: Yes 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 Functional Limits Praxis Praxis: Intact  Cognition Overall Cognitive Status: Within Functional Limits for tasks assessed Arousal/Alertness: Awake/alert Orientation Level: Oriented X4 Attention: Selective;Focused;Sustained Focused Attention: Appears intact Sustained Attention: Appears intact Selective Attention: Appears intact Memory: Appears intact Awareness: Appears intact Behaviors: Impulsive Sensation Sensation Light Touch: Appears Intact Coordination Gross Motor Movements are Fluid and Coordinated: No Fine Motor Movements are Fluid and Coordinated: No Motor  Motor Motor: Hemiplegia Motor - Skilled Clinical Observations: L hemiparesis UE > LE  Mobility Bed Mobility Bed Mobility: Rolling Left;Left Sidelying to Sit;Sitting - Scoot to Edge of Bed Rolling Left: 5: Supervision Rolling Left Details: Verbal cues for sequencing;Verbal cues for technique Left Sidelying to Sit: 5: Supervision Left Sidelying to Sit Details:  Verbal cues for sequencing;Verbal cues for technique;Verbal cues for precautions/safety Sitting - Scoot to Edge of Bed: 5: Supervision Sitting - Scoot to EdgMarshall & Ilsley Bed Details: Verbal cues for technique;Verbal cues for precautions/safety Transfers Transfers: Yes Sit to Stand: 3: Mod assist Sit to Stand Details: Verbal cues for sequencing;Verbal cues for technique;Verbal cues for precautions/safety;Tactile cues for weight shifting;Manual facilitation for weight shifting Stand to Sit: 3: Mod assist Stand to Sit Details (indicate cue type and reason): Verbal cues for technique;Verbal cues for precautions/safety;Manual facilitation for weight shifting Squat Pivot Transfers: 3: Mod assist Squat Pivot Transfer Details: Verbal cues for sequencing;Verbal cues for technique;Tactile cues for sequencing;Manual facilitation for weight shifting;Tactile cues for weight shifting;Verbal cues for precautions/safety  Locomotion  Ambulation Ambulation: Yes Ambulation/Gait Assistance: 2: Max assist Assistive device: Other (Comment) (hallway rail) Ambulation/Gait Assistance Details: Verbal cues for technique;Verbal cues for sequencing;Verbal cues for gait pattern;Manual facilitation for weight shifting;Manual facilitation for placement;Tactile cues for weight shifting;Tactile cues for placement;Tactile cues for posture Gait Gait: Yes Gait Pattern: Impaired Gait Pattern: Step-to pattern;Decreased step length - right;Decreased step length - left;Narrow base of support;Poor foot clearance - left;Trunk flexed;Lateral trunk lean to left;Left genu recurvatum Gait velocity: decreased Stairs / Additional Locomotion Stairs: Yes Stairs Assistance: 2: Max assist Stair Management Technique: One rail Right (using R UE) Ramp: Not tested (comment) (not appropriate) Curb: Not tested (comment) (not appropriate) Architect: Yes Wheelchair Assistance: 5: Supervision Wheelchair Assistance Details:  Verbal cues for precautions/safety;Verbal cues for safe use of DME/AE Wheelchair Propulsion: Right upper extremity;Right lower extremity Wheelchair Parts Management: Supervision/cueing  Trunk/Postural Assessment  Cervical Assessment Cervical Assessment: Within Functional Limits Thoracic Assessment Thoracic Assessment: Within Functional Limits Lumbar Assessment Lumbar Assessment: Within Functional Limits Postural Control Postural Control: Deficits on evaluation (impaired trunk control sitting and standing)  Balance Balance Balance Assessed: Yes Static Sitting Balance Static Sitting - Balance Support: Feet supported Static Sitting - Level of Assistance: 5: Stand by assistance Static Standing Balance Static Standing - Balance Support: Right upper extremity supported;During functional activity Static Standing - Level of Assistance: 3: Mod assist Dynamic Standing Balance Dynamic Standing - Balance Support: Right upper extremity supported;During functional activity Dynamic Standing - Level of Assistance: 2: Max assist Extremity Assessment      RLE Assessment RLE Assessment: Exceptions to [employer] RLE Strength Right Hip Flexion: 5/5 Right Hip ABduction: 5/5 Right Hip ADduction: 5/5 Right Knee Flexion: 5/5 Right Knee Extension: 5/5 Right Ankle Dorsiflexion: 5/5 Right Ankle Plantar Flexion: 5/5 LLE Assessment LLE Assessment: Exceptions to [facility] LLE Strength Left Hip Flexion: 2/5 Left Hip ABduction: 2-/5 Left Hip ADduction: 2-/5 Left Knee Flexion: 1/5 Left Knee Extension: 3-/5   See Function Navigator for Current Functional Status.   Refer to Care Plan for Long Term Goals  Recommendations for other services: Therapeutic Recreation  Stress management and Outing/community reintegration  Discharge Criteria: Patient will be discharged from PT if patient refuses treatment 3 consecutive times without medical reason, if treatment goals not met, if there is a change in medical status, if  patient makes no progress towards goals or if patient is discharged from hospital.  The above assessment, treatment plan, treatment alternatives and goals were discussed and mutually agreed upon: by patient  Shuronda Santino 02/05/2017, 5:42 PM

## 2017-02-05 NOTE — Progress Notes (Signed)
Patient information reviewed and entered into eRehab system by Caly Pellum, RN, CRRN, PPS Coordinator.  Information including medical coding and functional independence measure will be reviewed and updated through discharge.    

## 2017-02-05 NOTE — Progress Notes (Signed)
Physical Therapy Session Note  Patient Details  Name: Stanley Farley MRN: [id number] Date of Birth: 10-13-1975  Today's Date: 02/05/2017 PT Individual Time: 1532-1600 PT Individual Time Calculation (min): 28 min   Short Term Goals: Week 1:  PT Short Term Goal 1 (Week 1): Pt will ambulate 61f with LRAD with mod A PT Short Term Goal 2 (Week 1): Pt will perform transfers with min A  PT Short Term Goal 3 (Week 1): Pt will ascend/descend 4 stairs with mod A  PT Short Term Goal 4 (Week 1): Pt will perform bed mobility with supervision  Skilled Therapeutic Interventions/Progress Updates:   Pt received supine in bed and agreeable to PT. Supine>sit transfer with min assist from PT and  Min cues.   Squat pivot transfers completes x 3 throughout treatment with min assist from PT and moderate cues for set up and sequencing.   PT instructed pt in Hemi technique WC propulsion x 1546fwith supervision assist with min cues for doorway management and turning.   Gait training x [id number] HW and max assist from PT. PT Required to advance the LLE as well as prevent knee buckle/Genurecurvatum.   Pt returned to room and performed squat pivot transfer as listed above to bed. Sit>supine completed with min assist  and left supine in bed with call bell in reach and all needs met.        Therapy Documentation Precautions:  Precautions Precautions: Fall Restrictions Weight Bearing Restrictions: No  pain: 0/10   See Function Navigator for Current Functional Status.   Therapy/Group: Individual Therapy  AusLorie Phenix2/2018, 10:25 PM

## 2017-02-05 NOTE — Evaluation (Signed)
Occupational Therapy Assessment and Plan  Patient Details  Name: Stanley Farley MRN: [id number] Date of Birth: 10/08/1975  OT Diagnosis: abnormal posture, ataxia, flaccid hemiplegia and hemiparesis, hemiplegia affecting non-dominant side and muscle weakness (generalized) Rehab Potential: Rehab Potential (ACUTE ONLY): Excellent ELOS: 18-21 days   Today's Date: 02/05/2017 OT Individual Time: 9629-5284 OT Individual Time Calculation (min): 60 min     Problem List:  Patient Active Problem List   Diagnosis Date Noted  . 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 Poorly controlled type 2 diabetes mellitus with peripheral neuropathy ([place])   . Benign essential HTN   . Tobacco abuse   . Marijuana abuse   . Hyperlipidemia   . H/O medication noncompliance   . Acute CVA (cerebrovascular accident) ([place]) 02/01/2017    Past Medical History:  Past Medical History:  Diagnosis Date  . Diabetes mellitus without complication ([place])   . Hypertension    Past Surgical History:  Past Surgical History:  Procedure Laterality Date  . TEE WITHOUT CARDIOVERSION N/A 02/04/2017   Procedure: TRANSESOPHAGEAL ECHOCARDIOGRAM (TEE);  Surgeon: Minna Merritts, MD;  Location: [facility] ORS;  Service: Cardiovascular;  Laterality: N/A;    Assessment & Plan Clinical Impression: 41 year old right-handed male with history of hypertension and diabetes mellitus as well as tobacco/Marijuana use. As per chart review patient, lives with spouse. 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 One level home with 2 steps to entry. [facility]ed [facility]2017 with left-sided weakness, slurred speech as well as intermittent nausea and vomiting. Cranial CT reviewed, negative. CTA of head and neck no large vessel occlusion or stenosis. MRI showed acute infarct in the posterior limb of the right internal  capsule. 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 Patient did not receive TPA. Echocardiogram with ejection fraction of 55%. Systolic function normal and no intracardiac source of emboli. TEE showed estimated ejection fraction of 55%. No apical thrombus or PFO. Bubble study was negative for shunt. Presently maintained on aspirin for CVA prophylaxis.Subcutaneous Lovenox for DVT prophylaxis.Hemoglobin A1c of 9.9. Patient had not been taking his oral agent or insulin for the past month due to bouts of hypoglycemia versus medical noncompliance. Nicoderm patch initiated for tobacco abuse. Tolerating a regular consistency diet. Physical and occupational therapy evaluations completed with recommendations of physical medicine rehabilitation consult. Patient was admitted for a comprehensive rehabilitation program.  Patient transferred to CIR on 02/04/2017 .    Patient currently requires max with basic self-care skills secondary to muscle weakness, decreased cardiorespiratoy endurance, abnormal tone, ataxia and decreased coordination and decreased sitting balance, decreased standing balance, decreased postural control, hemiplegia and decreased balance strategies.  Prior to hospitalization, patient could complete ADL/IADLs with independent .  Patient will benefit from skilled intervention to decrease level of assist with basic self-care skills and increase independence with basic self-care skills prior to discharge home with care partner.  Anticipate patient will require supervision- minimal physical assistance and follow up home health.  OT - End of Session Activity Tolerance: Tolerates 10 - 20 min activity with multiple rests Endurance Deficit: Yes OT Assessment Rehab Potential (ACUTE ONLY): Excellent OT Patient demonstrates impairments in the following area(s): Balance;Endurance;Motor;Perception;Safety OT Basic ADL's Functional Problem(s):  Eating;Grooming;Bathing;Dressing;Toileting OT Transfers Functional Problem(s): Toilet;Tub/Shower OT Additional Impairment(s): Fuctional Use of Upper Extremity OT Plan OT Intensity: Minimum of 1-2 x/day, 45  to 90 minutes OT Frequency: 5 out of 7 days OT Duration/Estimated Length of Stay: 18-21 days OT Treatment/Interventions: Medical illustrator training;Community reintegration;Discharge planning;Disease mangement/prevention;DME/adaptive equipment instruction;Functional electrical stimulation;Functional mobility training;Neuromuscular re-education;Pain management;Patient/family education;Psychosocial support;Self Care/advanced ADL retraining;Skin care/wound managment;Splinting/orthotics;Therapeutic Activities;Therapeutic Exercise;UE/LE Strength taining/ROM;UE/LE Coordination activities OT Self Feeding Anticipated Outcome(s): Mod I OT Basic Self-Care Anticipated Outcome(s): Supervision OT Toileting Anticipated Outcome(s): Supervision OT Bathroom Transfers Anticipated Outcome(s): Supervision- min A OT Recommendation Recommendations for Other Services: Therapeutic Recreation consult Therapeutic Recreation Interventions: Pet therapy;Outing/community reintergration;Stress management Patient destination: Home Follow Up Recommendations: Home health OT Equipment Recommended: To be determined   Skilled Therapeutic Intervention Pt seen for OT eval and ADL bathing/dressing session. Pt in supine upon arrival, agreeable to tx session. He transferred to EOB with assist for managing L LE off EOB. Throughout session completed squat pivot transfers with VCs for hand placement/ technique and min A when transferring to R, mod A to weaker L side. He bathed seated on tub bench, requiring intermittent assist for dynamic sitting balance due to L lean with dynamic tasks. He returned to w/c to dress, education provided regarding hemi techniques for dressing. He stood at sink with mod A to stand and max A for dynamic  standing balance due to L lean. Grooming completed from w/c level at sink with pt demonstrating good problem solve skills in order to manage containers independently. Pt left seated in w/c at end of session with all needs in reach and set-up with breakfast tray. Made aware of need to use call bell when desiring to return to bed and need for assist with all mobility, pt voiced understanding.  Pt educated throughout session regarding role of OT, POC, CVA and risk factors, and d/c planning.  OT Evaluation Precautions/Restrictions  Precautions Precautions: Fall Restrictions Weight Bearing Restrictions: No General Chart Reviewed: Yes Pain Pain Assessment Pain Score: 0-No pain No/ denies pain Home Living/Prior Functioning Home Living Family/patient expects to be discharged to:: Private residence Living Arrangements: Spouse/significant other Available Help at Discharge: Family, Available 24 hours/day Type of Home: House Home Access: Stairs to enter [employer] of Steps: 2 steps w/ bilateral hand rails for BACK door; front door with 15 steps and bilateral hand rails Entrance Stairs-Rails: Can reach both, Left, Right Home Layout: One level Bathroom Shower/Tub: Tub/shower unit, Multimedia programmer: Standard  Lives With: Spouse, Family, Son IADL History Current License: Yes 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  Currently not working. Vision Baseline Vision/History: No visual deficits Patient Visual Report: No change from baseline Vision Assessment?: No apparent visual deficits Perception  Perception: Within Functional Limits Praxis Praxis:  Intact Cognition Overall Cognitive Status: Within Functional Limits for tasks assessed Arousal/Alertness: Awake/alert Orientation Level: Person;Place;Situation Person: Oriented Place: Oriented Situation: Oriented Year: 2018 Month: August Day of Week: Correct Memory: Appears intact Immediate Memory Recall: Sock;Blue;Bed Memory Recall: Sock;Blue;Bed Memory Recall Sock: With Cue Memory Recall Blue: Without Cue Memory Recall Bed: Without Cue Focused Attention: Appears intact Sustained Attention: Appears intact Awareness: Appears intact Problem Solving: Appears intact Reasoning: Appears intact Decision Making: Appears intact Safety/Judgment: Appears intact Sensation Sensation Light Touch: Appears Intact Proprioception: Appears Intact Coordination Gross Motor Movements are Fluid and Coordinated: No Fine Motor Movements are Fluid and Coordinated: No Coordination and Movement Description: L hemi  UE>LE Finger Nose Finger Test: WFL R UE; unable to test L UE Motor  Motor Motor: Hemiplegia Motor - Skilled Clinical Observations: Dense L hemi UE> LE Trunk/Postural Assessment  Cervical Assessment Cervical Assessment: Within Functional Limits Thoracic Assessment Thoracic Assessment: Within Functional Limits Lumbar Assessment Lumbar Assessment: Within Functional Limits Postural Control Postural Control: Deficits on evaluation (Impaired trunk control due to weakness)  Balance Balance Balance Assessed: Yes Static Sitting Balance Static Sitting - Balance Support: Feet supported Static Sitting - Level of Assistance: 5: Stand by assistance;4: Min assist Dynamic Sitting Balance Dynamic Sitting - Balance Support: During functional activity Dynamic Sitting - Level of Assistance: 3: Mod assist;2: Max assist Sitting balance - Comments: Sitting to complete bathing/dressing tasks Static Standing Balance Static Standing - Balance Support: During functional activity;Right upper extremity  supported Static Standing - Level of Assistance: 3: Mod assist Static Standing - Comment/# of Minutes: Standing at sink Dynamic Standing Balance Dynamic Standing - Balance Support: During functional activity;Right upper extremity supported Dynamic Standing - Level of Assistance: 2: Max assist Dynamic Standing - Comments: Standing to complete clothing management Extremity/Trunk Assessment RUE Assessment RUE Assessment: Within Functional Limits LUE Assessment LUE Assessment: Exceptions to [facility] (Trace movements with shoulder shrug; 0/5 all other movements)   See Function Navigator for Current Functional Status.   Refer to Care Plan for Long Term Goals  Recommendations for other services: Therapeutic Recreation  Pet therapy, Stress management and Outing/community reintegration   Discharge Criteria: Patient will be discharged from OT if patient refuses treatment 3 consecutive times without medical reason, if treatment goals not met, if there is a change in medical status, if patient makes no progress towards goals or if patient is discharged from hospital.  The above assessment, treatment plan, treatment alternatives and goals were discussed and mutually agreed upon: by patient  Ernestina Patches 02/05/2017, 12:11 PM

## 2017-02-06 ENCOUNTER — Inpatient Hospital Stay (HOSPITAL_COMMUNITY): Payer: Self-pay | Admitting: Physical Therapy

## 2017-02-06 ENCOUNTER — Inpatient Hospital Stay (HOSPITAL_COMMUNITY): Payer: Self-pay | Admitting: Occupational Therapy

## 2017-02-06 DIAGNOSIS — I69354 Hemiplegia and hemiparesis following cerebral infarction affecting left non-dominant side: Secondary | ICD-10-CM

## 2017-02-06 DIAGNOSIS — E1165 Type 2 diabetes mellitus with hyperglycemia: Secondary | ICD-10-CM

## 2017-02-06 DIAGNOSIS — E1142 Type 2 diabetes mellitus with diabetic polyneuropathy: Secondary | ICD-10-CM

## 2017-02-06 DIAGNOSIS — I10 Essential (primary) hypertension: Secondary | ICD-10-CM

## 2017-02-06 LAB — GLUCOSE, CAPILLARY
GLUCOSE-CAPILLARY: 95 mg/dL (ref 65–99)
Glucose-Capillary: 185 mg/dL — ABNORMAL HIGH (ref 65–99)

## 2017-02-06 MED ORDER — LISINOPRIL 20 MG PO TABS: 20.0000 mg | ORAL_TABLET | Freq: Every day | ORAL | 2 refills | Status: DC

## 2017-02-06 MED ORDER — INSULIN GLARGINE 100 UNITS/ML SOLOSTAR PEN: 15.0000 [IU] | PEN_INJECTOR | Freq: Every day | SUBCUTANEOUS | 11 refills | Status: DC

## 2017-02-06 MED ORDER — ATORVASTATIN CALCIUM 40 MG PO TABS: 40.0000 mg | ORAL_TABLET | Freq: Every day | ORAL | 2 refills | Status: AC

## 2017-02-06 MED ORDER — NICOTINE 14 MG/24HR TD PT24: MEDICATED_PATCH | TRANSDERMAL | 0 refills | Status: DC

## 2017-02-06 MED ORDER — GLIPIZIDE ER 10 MG PO TB24: 10.0000 mg | ORAL_TABLET | Freq: Every day | ORAL | 0 refills | Status: DC

## 2017-02-06 MED ORDER — ASPIRIN 325 MG PO TABS: 325.0000 mg | ORAL_TABLET | Freq: Every day | ORAL | 2 refills | Status: AC

## 2017-02-06 NOTE — Progress Notes (Signed)
Social Work  Discharge Note  The overall goal for the admission was met for:   Discharge location: Yes-home with wife and son's  Length of Stay: No-2 days  Discharge activity level: No-min assist wheelchair level  Home/community participation: Yes  Services provided included: MD, RD, PT, OT, SLP, RN, CM, Pharmacy and SW  Financial Services: Other: Medicaid pending  Follow-up services arranged: Home Health: advanced home care-pt,ot,rn, DME: advanced home care-wheelchair and bedside commode and Patient/Family has no preference for HH/DME agencies  Comments (or additional information):wife was in for family training and pt insisting on going home, therapy team feels he would benefit from staying here. Leaving at a wheelchair level-min assist, not recommended to ambulate With family at home. Gave both the Match for assistance with medications for one month. He and wife to follow up with [facility], since he has been a patient before there. Pt is at high risk to fall at home due to amount of care he requires and his feeling he can ambulate at home. Wife to follow up with Medicaid and disability applications   Patient/Family verbalized understanding of follow-up arrangements: Yes  Individual responsible for coordination of the follow-up plan: self & katrina-wife  Confirmed correct DME delivered: Elease Hashimoto 02/06/2017    Elease Hashimoto

## 2017-02-06 NOTE — Progress Notes (Signed)
Physical Therapy Discharge Summary  Patient Details  Name: Stanley Farley MRN: [id number] Date of Birth: December 16, 1975  Today's Date: 02/06/2017 PT Individual Time:1530-1600   52mn    Patient did not meet any therapy goals.  Pt evaluated on 02/05/17 with estimated length of stay to be 18-21 days.  On 02/06/17 pt declining further stay on CIR and demanding to be discharged, stating he can complete his therapy at home.  PT and rehab staff provided extensive education to pt and family regarding potential for recovery and speed of recovery while on CIR [facility] but pt adamant that he does not get enough rest here and would "be walking by Christmas" if he went home. Patient to discharge at a wheelchair level [place]   Patient's wife present for short education session focus on transfers, w/c mobility, and home entry.  PT recommending w/c mobility only at d/c until f/u and family education at home health therapist's discretion. Educated family on high falls risk and they verbalized understanding.   Reasons goals not met: Pt left rehab against recommendations of therapy team.   Recommendation:  Patient will benefit from ongoing skilled PT services in home health setting to continue to advance safe functional mobility, address ongoing impairments in safety awareness, motor control, postural control, balance, strength, and attention, and minimize fall risk.  Equipment: 18x18 w/c with basic cushion and leg rests  Reasons for discharge: discharge from hospital and pt declining further participation in rehab program  Patient/family agrees with progress made: Yes   PT Treatment. PT instructed pt and Wife in transfers training to various surfaces with squat pivot technique and min assist.  PT instructed pt and Wife in stair management x 4 steps with max assist from PT and max assist from Wife and PT for family education.  See below for details with functional status Patient returned  too room and [facility] and all needs met.     PT Discharge Precautions/Restrictions Precautions Precautions: Fall Precaution Comments: left hemiparesis Restrictions Weight Bearing Restrictions: No Pain Pain Assessment Pain Assessment: No/denies pain Vision/Perception  Perception Perception: Within Functional Limits Praxis Praxis: Intact  Cognition Overall Cognitive Status: Within Functional Limits for tasks assessed Arousal/Alertness: Awake/alert Orientation Level: Oriented X4 Attention: Selective;Focused;Sustained Focused Attention: Appears intact Sustained Attention: Appears intact Selective Attention: Appears intact Memory: Appears intact Awareness: Appears intact Problem Solving: Appears intact Executive Function: Reasoning;Sequencing;Decision Making Decision Making: Appears intact Behaviors: Impulsive Safety/Judgment: Other (comment) (impulsive) Sensation Sensation Light Touch: Appears Intact Proprioception: Appears Intact Coordination Gross Motor Movements are Fluid and Coordinated: No Fine Motor Movements are Fluid and Coordinated: No Coordination and Movement Description: L hemi UE>LE Finger Nose Finger Test: WFL R UE; unable to test L UE Motor  Motor Motor: Hemiplegia Motor - Skilled Clinical Observations: L hemiparesis UE > LE  Mobility Bed Mobility Bed Mobility: Supine to Sit Supine to Sit: HOB elevated;5: Supervision Sitting - Scoot to Edge of Bed: 5: Supervision Sitting - Scoot to EMarshall [place] Ils[place] Bed Details: Verbal cues for technique;Verbal cues for precautions/safety Transfers Sit to Stand: 4: Min assist Sit to Stand Details: Verbal cues for sequencing;Verbal cues for technique;Verbal cues for precautions/safety;Tactile cues for weight shifting;Manual facilitation for weight shifting;Tactile cues for posture Stand to Sit: 4: Min assist Stand to Sit Details (indicate cue type and reason): Verbal cues for technique;Verbal  cues for precautions/safety;Manual facilitation for weight shifting;Tactile cues for placement;Tactile cues for weight shifting Locomotion  Ambulation  Ambulation: Yes Ambulation/Gait Assistance: 2: Max assist Ambulation Distance (Feet): 20 Feet Gait Gait: Yes Gait Pattern: Impaired Gait Pattern: Left steppage;Left foot flat;Left genu recurvatum;Lateral hip instability Stairs / Additional Locomotion Stairs: Yes Stairs Assistance: 1: +2 Total assist;2: Max assist Stairs Assistance Details: Manual facilitation for weight shifting;Manual facilitation for placement;Manual facilitation for weight bearing;Verbal cues for safe use of DME/AE;Verbal cues for gait pattern;Verbal cues for precautions/safety;Verbal cues for technique;Verbal cues for sequencing Stair Management Technique: One rail Right Number of Stairs: 4 Height of Stairs: 6 Wheelchair Mobility Wheelchair Mobility: Yes Wheelchair Assistance: 5: Personnel officer Assistance Details: Verbal cues for Information systems manager: Right upper extremity;Right lower extremity Wheelchair Parts Management: Supervision/cueing Distance: 117f   Trunk/Postural Assessment  Cervical Assessment Cervical Assessment: Within Functional Limits Thoracic Assessment Thoracic Assessment: Within Functional Limits Lumbar Assessment Lumbar Assessment: Within Functional Limits Postural Control Postural Control:  (decreased trunk control )  Balance Balance Balance Assessed: Yes Static Sitting Balance Static Sitting - Balance Support: Feet supported Static Sitting - Level of Assistance: 5: Stand by assistance Dynamic Sitting Balance Dynamic Sitting - Balance Support: During functional activity Dynamic Sitting - Level of Assistance: 3: Mod assist Static Standing Balance Static Standing - Balance Support: Right upper extremity supported Static Standing - Level of Assistance: 3: Mod assist Extremity Assessment  RUE Assessment RUE  Assessment: Within Functional Limits LUE Assessment LUE Assessment: Exceptions to [facility](trace movement in shoulder shrug, 0/5 all other movements)   RLE: WFL LLE Assessment LLE Assessment: Exceptions to [facility] Strength Left Hip Flexion: 2/5 Left Hip ABduction: 2-/5 Left Hip ADduction: 2-/5 Left Knee Flexion: 1/5 Left Knee Extension: 3-/5   See Function Navigator for Current Functional Status.  ALorie Phenix8/10/2016, 6:17 AM

## 2017-02-06 NOTE — Discharge Summary (Signed)
Stanley Farley:  Chill, Qadir                ACCOUNT NO.:  [id number]  MEDICAL RECORD NO.:  [id number]  LOCATION:                                 FACILITY:  PHYSICIAN:  [name], M.D.DATE OF BIRTH:  [birth year]  DATE OF ADMISSION:  02/04/2017 DATE OF DISCHARGE:  02/06/2017                              DISCHARGE SUMMARY   DISCHARGE DIAGNOSES: 1. Right internal capsule infarction. 2. Subcutaneous Lovenox for deep venous thrombosis prophylaxis. 3. Diabetes mellitus. 4. Hypertension. 5. Tobacco and marijuana use. 6. Hyperlipidemia. 7. Medical noncompliance.  HISTORY OF PRESENT ILLNESS:  This is a 41 year old right-handed male with history of hypertension, diabetes, tobacco and marijuana use. Lives with spouse, independent, working as a [employer]at[employer]ic prior to admission.  Presented to [facility] on February 01, 2017, with left-sided weakness, slurred speech as well as nausea and vomiting.  Cranial CT scan negative.  CTA of head and neck showed no stenosis or occlusion.  MRI showed acute infarction in the posterior limb of right internal capsule.  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  The patient did not receive tPA. Echocardiogram with ejection fraction of 55%.  TEE showed ejection fraction of 55%.  No PFO.  Maintained on aspirin for CVA prophylaxis. Subcutaneous Lovenox for DVT prophylaxis.  Hemoglobin A1c of 9.9. Insulin therapy as directed.  The patient was admitted for comprehensive rehab program.  PAST MEDICAL HISTORY:  See discharge diagnoses.  SOCIAL HISTORY:  Lives with wife independent prior to admission.  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  FUNCTIONAL STATUS:  [facility] was mod-to-max assist 3 feet without assistive device; moderate assist sit-to-stand; mod-to-max assist activities of daily living.  PHYSICAL EXAMINATION:  VITAL SIGNS:  Blood pressure 153/86, pulse 72, temperature 98, respirations  16. GENERAL:  This is an alert male, in no acute distress.  Follow commands. Speech is dysarthric, but intelligible.  Fair, but limited awareness of deficits. HEENT:  EOMs intact. NECK:  Supple.  Nontender.  No JVD. CARDIAC:  Rate controlled. ABDOMEN:  Soft, nontender.  Good bowel sounds. LUNGS:  Clear to auscultation without wheeze.  REHABILITATION HOSPITAL COURSE:  The patient was admitted to [facility] with therapies initiated on a 3-hour daily basis, consisting of physical therapy, occupational therapy, speech therapy and rehabilitation nursing.  The following issues were addressed during the patient's rehabilitation stay.  Pertaining to Mr. Stanley Farley right internal capsule infarction, remained stable, maintained on aspirin therapy. Subcutaneous Lovenox for DVT prophylaxis.  No bleeding episodes.  Blood pressures controlled on lisinopril.  He did have a history of diabetes mellitus.  Hemoglobin A1c of 9.9 with limited medical compliance.  He continued on insulin therapy as well as Glucotrol, full diabetic teaching.  He did have a history of tobacco and marijuana use, maintained on a NicoDerm patch as well as receiving full counseling in regard to cessation of tobacco or illicit drug products.  It was quite questionable if he would be compliant with these requests.  Lipitor, ongoing for hyperlipidemia.  The patient received weekly collaborative interdisciplinary team conferences to discuss estimated length of stay, family teaching, any barriers to discharge.  He could propel  his wheelchair, supervision with hemi technique, needing ongoing assistance for mobility.  Occupational therapy again, needing assistance due to CVA infarction, required max assistance with basic self-care skills secondary to muscle weakness, abnormal tone, ataxia, decreased coordination.  The patient continued to be quite adamant about discharging to home on the morning of February 06, 2017.  Long  discussions were held with the patient in regard to maintaining therapy program and the benefits that it could provide; however, he continued to be quite adamant about discharge.  This was also discussed with his wife. Necessary arrangements were made for discharge, family teaching, and equipment orders and discharge taking place.  DISCHARGE MEDICATIONS:  Included; 1. Aspirin 325 mg p.o. daily. 2. Lipitor 40 mg p.o. daily. 3. Glucotrol 10 mg p.o. daily. 4. Lantus insulin 15 units daily. 5. Lisinopril 20 mg p.o. daily. 6. Nicoderm patch taper as directed.  DIET:  Diabetic diet.  FOLLOWUP:  The patient would follow up with Dr. Claudette LawsAndrew Kirsteins at [facility] as directed; Dr. Roda ShuttersXu, Neurology Service, call for appointment; and with his primary M.D., Dr. Letta PateAycock.  SPECIAL INSTRUCTIONS:  No drinking, smoking, alcohol, or illicit drug products.     Stanley Farley, P.A.   ______________________________ [name], M.D.    DA/MEDQ  D:  02/06/2017  T:  02/06/2017  Job:  [id number]  cc:   Dr. Mikey BussingXu Andrew E. Kirsteins, M.D. Karie FetchNgwe Aycock, M.D.

## 2017-02-06 NOTE — Discharge Summary (Signed)
Discharge summary job [id number]

## 2017-02-06 NOTE — Progress Notes (Signed)
Pt being discharged home via wheelchair with family. Pt alert and oriented x4. VSS. Pt c/o no pain at this time. No signs of respiratory distress. Education complete and care plans resolved. No further issues at this time. MD, PA and therapist discussed with patient and family resources after discharge. Pt to follow up with PCP. Jillyn HiddenStone,Svetlana Bagby R, RN

## 2017-02-06 NOTE — Progress Notes (Signed)
Social Work Patient ID: Stanley Farley, male   DOB: 05-03-1976, 41 y.o.   MRN: [id number]  Spoke with wife via telephone to answer her questions regarding pt going home today. Informed her MD feels he can be discharged today and  [facility]. Wife plans to be here at 2:30 and can go through therapies with pt to prepare for discharge this afternoon. Home health has been arranged and equipment to be delivered to his room prior To discharge. Pt and wife are in agreement with him going home today.

## 2017-02-06 NOTE — Progress Notes (Signed)
Occupational Therapy Session Note  Patient Details  Name: Stanley Farley MRN: [id number] Date of Birth: 1975/08/03  Today's Date: 02/06/2017 OT Individual Time: [id number] OT Individual Time Calculation (min): 32 min    Short Term Goals: Week 1:  OT Short Term Goal 1 (Week 1): Pt will complete 1/3 toileting tasks with steadying assist OT Short Term Goal 2 (Week 1): Pt will recall and implement hemi dressing techniques mod I OT Short Term Goal 3 (Week 1): Pt will complete toilet transfer with min A OT Short Term Goal 4 (Week 1): Pt will complete one grooming task in standing with steadying assist and assist for set-up OT Short Term Goal 5 (Week 1): Pt will don pants with mod A  Skilled Therapeutic Interventions/Progress Updates:    Pt completed transfer from supine to sit EOB with supervision.  Issued shoe buttons to assist with tying shoes and demonstrated use with patient.  As pt was attempting to donn shoe on the right foot crossed over the left, he exhibited LOB to the left, requiring min assist to self correct.   Worked on toilet transfers and simulated walk-in shower transfers during session from wheelchair squat pivot.  He was able to complete transfers with min assist and mod demonstrational cueing for correct technique and foot position.  Returned to room at end of session with pt in wheelchair with call button and phone in reach.  Attempted to discuss benefit of staying on rehab to get the most therapy possible, but pt stating he would do better at home and that he is not resting here in the hospital.  Recommend 24 hour physical assist at home at this time.  Unsure that this is available as pt reports that his wife will be working.    Therapy Documentation Precautions:  Precautions Precautions: Fall Precaution Comments: left hemiparesis Restrictions Weight Bearing Restrictions: No  Pain: Pain Assessment Pain Assessment: No/denies pain ADL: See Function Navigator for Current  Functional Status.   Therapy/Group: Individual Therapy  Stanley Farley OTR/L 02/06/2017, 4:16 PM

## 2017-02-06 NOTE — Care Management Note (Signed)
Inpatient Rehabilitation Center Individual Statement of Services  Patient Name:  Stanley Farley  Date:  02/06/2017  Welcome to the Inpatient Rehabilitation Center.  Our goal is to provide you with an individualized program based on your diagnosis and situation, designed to meet your specific needs.  With this comprehensive rehabilitation program, you will be expected to participate in at least 3 hours of rehabilitation therapies Monday-Friday, with modified therapy programming on the weekends.  Your rehabilitation program will include the following services:  Physical Therapy (PT), Occupational Therapy (OT), Speech Therapy (ST), 24 hour per day rehabilitation nursing, Neuropsychology, Case Management (Social Worker), Rehabilitation Medicine, Nutrition Services and Pharmacy Services  Weekly team conferences will be held on Wednesday to discuss your progress.  Your Social Worker will talk with you frequently to get your input and to update you on team discussions.  Team conferences with you and your family in attendance may also be held.  Expected length of stay: 18-21 days  Overall anticipated outcome: supervision-mod/i level  Depending on your progress and recovery, your program may change. Your Social Worker will coordinate services and will keep you informed of any changes. Your Social Worker's name and contact numbers are listed  below.  The following services may also be recommended but are not provided by the Inpatient Rehabilitation Center:   Driving Evaluations  Home Health Rehabiltiation Services  Outpatient Rehabilitation Services  Vocational Rehabilitation   Arrangements will be made to provide these services after discharge if needed.  Arrangements include referral to agencies that provide these services.  Your insurance has been verified to be:  none Your primary doctor is:  none  Pertinent information will be shared with your doctor and your insurance company.  Social  Worker:  Dossie DerBecky Wenonah Milo, [place] [phone] or [phone]) [phone]  Information discussed with and copy given to patient by: Lucy Chrisupree, Jeanluc Wegman G, 02/06/2017, 9:17 AM

## 2017-02-06 NOTE — Progress Notes (Signed)
Subjective/Complaints: Patient states he wants to go home Complains of interrupted sleep due to vital signs, complaints of food, thinks he can do therapy on his own. We discussed the difference between home health and intensive inpatient rehabilitation. In terms of therapy, quality and quantity  Reviewed physical therapy note, wheelchair level, min assist. Transfers  Patient states his family can assist him at home ROS- neg CP/SOB, - N/V/D Objective: Vital Signs: Blood pressure (!) 150/89, pulse 67, temperature 98 F (36.7 C), temperature source Oral, resp. rate 14, height '6\' 2"'  (1.88 m), weight 97.5 kg (215 lb), SpO2 100 %. No results found. Results for orders placed or performed during the hospital encounter of 02/04/17 (from the past 72 hour(s))  CBC     Status: Abnormal   Collection Time: 02/04/17  4:19 PM  Result Value Ref Range   WBC 6.1 4.0 - 10.5 K/uL   RBC 3.96 (L) 4.22 - 5.81 MIL/uL   Hemoglobin 12.8 (L) 13.0 - 17.0 g/dL   HCT 35.9 (L) 39.0 - 52.0 %   MCV 90.7 78.0 - 100.0 fL   MCH 32.3 26.0 - 34.0 pg   MCHC 35.7 30.0 - 36.0 g/dL   RDW 12.5 11.5 - 15.5 %   Platelets 203 150 - 400 K/uL  Creatinine, serum     Status: Abnormal   Collection Time: 02/04/17  4:19 PM  Result Value Ref Range   Creatinine, Ser 1.38 (H) 0.61 - 1.24 mg/dL   GFR calc non Af Amer >60 >60 mL/min   GFR calc Af Amer >60 >60 mL/min    Comment: (NOTE) The eGFR has been calculated using the CKD EPI equation. This calculation has not been validated in all clinical situations. eGFR's persistently <60 mL/min signify possible Chronic Kidney Disease.   Glucose, capillary     Status: Abnormal   Collection Time: 02/04/17  5:01 PM  Result Value Ref Range   Glucose-Capillary 105 (H) 65 - 99 mg/dL  Glucose, capillary     Status: Abnormal   Collection Time: 02/04/17  9:09 PM  Result Value Ref Range   Glucose-Capillary 199 (H) 65 - 99 mg/dL  CBC WITH DIFFERENTIAL     Status: Abnormal   Collection Time:  02/05/17  5:08 AM  Result Value Ref Range   WBC 6.4 4.0 - 10.5 K/uL   RBC 3.89 (L) 4.22 - 5.81 MIL/uL   Hemoglobin 12.5 (L) 13.0 - 17.0 g/dL   HCT 35.0 (L) 39.0 - 52.0 %   MCV 90.0 78.0 - 100.0 fL   MCH 32.1 26.0 - 34.0 pg   MCHC 35.7 30.0 - 36.0 g/dL   RDW 12.4 11.5 - 15.5 %   Platelets 212 150 - 400 K/uL   Neutrophils Relative % 46 %   Neutro Abs 2.9 1.7 - 7.7 K/uL   Lymphocytes Relative 36 %   Lymphs Abs 2.3 0.7 - 4.0 K/uL   Monocytes Relative 12 %   Monocytes Absolute 0.8 0.1 - 1.0 K/uL   Eosinophils Relative 6 %   Eosinophils Absolute 0.4 0.0 - 0.7 K/uL   Basophils Relative 0 %   Basophils Absolute 0.0 0.0 - 0.1 K/uL  Comprehensive metabolic panel     Status: Abnormal   Collection Time: 02/05/17  5:08 AM  Result Value Ref Range   Sodium 138 135 - 145 mmol/L   Potassium 3.8 3.5 - 5.1 mmol/L   Chloride 107 101 - 111 mmol/L   CO2 27 22 - 32 mmol/L   Glucose,  Bld 147 (H) 65 - 99 mg/dL   BUN 16 6 - 20 mg/dL   Creatinine, Ser 1.19 0.61 - 1.24 mg/dL   Calcium 8.5 (L) 8.9 - 10.3 mg/dL   Total Protein 6.0 (L) 6.5 - 8.1 g/dL   Albumin 2.7 (L) 3.5 - 5.0 g/dL   AST 20 15 - 41 U/L   ALT 15 (L) 17 - 63 U/L   Alkaline Phosphatase 45 38 - 126 U/L   Total Bilirubin 0.5 0.3 - 1.2 mg/dL   GFR calc non Af Amer >60 >60 mL/min   GFR calc Af Amer >60 >60 mL/min    Comment: (NOTE) The eGFR has been calculated using the CKD EPI equation. This calculation has not been validated in all clinical situations. eGFR's persistently <60 mL/min signify possible Chronic Kidney Disease.    Anion gap 4 (L) 5 - 15  Glucose, capillary     Status: Abnormal   Collection Time: 02/05/17  6:57 AM  Result Value Ref Range   Glucose-Capillary 121 (H) 65 - 99 mg/dL  Glucose, capillary     Status: Abnormal   Collection Time: 02/05/17 12:00 PM  Result Value Ref Range   Glucose-Capillary 133 (H) 65 - 99 mg/dL  Glucose, capillary     Status: Abnormal   Collection Time: 02/05/17  4:56 PM  Result Value Ref  Range   Glucose-Capillary 139 (H) 65 - 99 mg/dL  Glucose, capillary     Status: Abnormal   Collection Time: 02/05/17  9:58 PM  Result Value Ref Range   Glucose-Capillary 116 (H) 65 - 99 mg/dL  Glucose, capillary     Status: None   Collection Time: 02/06/17  6:12 AM  Result Value Ref Range   Glucose-Capillary 95 65 - 99 mg/dL     HEENT: normal Cardio: RRR and no murmur Resp: CTA B/L and unlabored GI: BS positive and NT, ND Extremity:  No Edema Skin:   Intact Neuro: Alert/Oriented, Normal Sensory, Abnormal Motor 2- Left tricep o/w 0/5 in LUE, 3- Left HF and KE , 0 ADF, Abnormal FMC Ataxic/ dec FMC and Dysarthric Musc/Skel:  Other no pain with UE or LE ROM Gen NAD   Assessment/Plan: 1. Functional deficits secondary to Left hemiparesis Right Int capsule infarct which require 3+ hours per day of interdisciplinary therapy in a comprehensive inpatient rehab setting. Physiatrist is providing close team supervision and 24 hour management of active medical problems listed below. Physiatrist and rehab team continue to assess barriers to discharge/monitor patient progress toward functional and medical goals. FIM: Function - Bathing Position: Shower Body parts bathed by patient: Left arm, Chest, Abdomen, Front perineal area, Right upper leg, Left upper leg, Right lower leg, Left lower leg Body parts bathed by helper: Right arm, Buttocks, Back  Function- Upper Body Dressing/Undressing What is the patient wearing?: Pull over shirt/dress Pull over shirt/dress - Perfomed by patient: Thread/unthread right sleeve, Put head through opening, Pull shirt over trunk Pull over shirt/dress - Perfomed by helper: Thread/unthread left sleeve Function - Lower Body Dressing/Undressing What is the patient wearing?: Underwear, Pants, Socks, Shoes Position: Wheelchair/chair at [employer] - Performed by helper: Thread/unthread right underwear leg, Thread/unthread left underwear leg, Pull underwear  up/down Pants- Performed by patient: Thread/unthread right pants leg Pants- Performed by helper: Thread/unthread left pants leg, Pull pants up/down Socks - Performed by patient: Don/doff right sock, Don/doff left sock Shoes - Performed by helper: Don/doff right shoe, Don/doff left shoe, Fasten right, Fasten left Assist for footwear: Maximal  assist  Function - Toileting Toileting activity did not occur: Safety/medical concerns Toileting steps completed by helper: Adjust clothing prior to toileting, Performs perineal hygiene, Adjust clothing after toileting  Function - Toilet Transfers Toilet transfer activity did not occur: Safety/medical concerns Toilet transfer assistive device: Grab bar Assist level to toilet: Moderate assist (Pt 50 - 74%/lift or lower) Assist level from toilet: Moderate assist (Pt 50 - 74%/lift or lower)  Function - Chair/bed transfer Chair/bed transfer method: Squat pivot Chair/bed transfer assist level: Moderate assist (Pt 50 - 74%/lift or lower) Chair/bed transfer assistive device: Armrests  Function - Locomotion: Wheelchair Will patient use wheelchair at discharge?:  (TBD) Type: Manual Max wheelchair distance: 180f Assist Level: Supervision or verbal cues Assist Level: Supervision or verbal cues Assist Level: Supervision or verbal cues Function - Locomotion: Ambulation Assistive device: Rail in hallway Max distance: 326fAssist level: Maximal assist (Pt 25 - 49%) Assist level: Maximal assist (Pt 25 - 49%)  Function - Comprehension Comprehension: Auditory Comprehension assist level: Follows complex conversation/direction with no assist  Function - Expression Expression: Verbal Expression assist level: Expresses complex ideas: With extra time/assistive device  Function - Social Interaction Social Interaction assist level: Interacts appropriately with others - No medications needed.  Function - Problem Solving Problem solving assist level: Solves  complex problems: Recognizes & self-corrects  Function - Memory Memory assist level: Complete Independence: No helper Patient normally able to recall (first 3 days only): Current season, Location of own room, Staff names and faces, That he or she is in a hospital  Medical Problem List and Plan: 1.   Left-sided weakness and dysarthria secondary to  right internal capsule infarction Continue to recommend CIR rehabilitation. However, patient prefers to go home at a lower level. We discussed that this would be a burden on his family and that he will not have the same speed of recovery  The patient is medically stable, although he does need to have some close medical follow-up for his hypertension and diabetes. 2.  DVT Prophylaxis/Anticoagulation:  Subcutaneous Lovenox. Monitor platelet counts and any signs of bleeding 3. Pain Management:   Oxycodone as needed 4. Mood:  Provide emotional support 5. Neuropsych: This patient  is capable of making decisions on  his own behalf. 6. Skin/Wound Care:  Routine skin checks 7. Fluids/Electrolytes/Nutrition:  Routine I&O with follow-up chemistries 8.Diabetes mellitus and peripheral neuropathy. Hemoglobin A1c 9.9. Lantus insulin 15 units daily, Glucotrol 10 mg daily. Provide diabetic teaching. Check blood sugars before meals and at bedtime CBG (last 3)   Recent Labs  02/05/17 1656 02/05/17 2158 02/06/17 0612  GLUCAP 139* 116* 95    9. Hypertension. Lisinopril 20 mg daily. Monitor with increased mobility Vitals:   02/05/17 1419 02/06/17 0630  BP: (!) 155/89 (!) 150/89  Pulse: 74 67  Resp: 18 14  Temp: 98.5 F (36.9 C) 98 F (36.7 C)   10. Tobacco/marijuana abuse. Nicoderm patch. Provide counseling 11. Hyperlipidemia. Lipitor 12.  QuestionMedical noncompliance. Provide counseling  LOS (Days) 2 A FACE TO FACE EVALUATION WAS PERFORMED  Shawnelle Spoerl E 02/06/2017, 9:31 AM

## 2017-02-06 NOTE — Discharge Instructions (Signed)
Inpatient Rehab Discharge Instructions  Clovia Cuffravis D Tomlin Discharge date and time: No discharge date for patient encounter.   Activities/Precautions/ Functional Status: Activity: activity as tolerated Diet: diabetic diet Wound Care: none needed Functional status:  ___ No restrictions     ___ Walk up steps independently ___ 24/7 supervision/assistance   ___ Walk up steps with assistance ___ Intermittent supervision/assistance  ___ Bathe/dress independently ___ Walk with walker     __x_ Bathe/dress with assistance ___ Walk Independently    ___ Shower independently ___ Walk with assistance    ___ Shower with assistance ___ No alcohol     ___ Return to work/school ________  Special Instructions: No driving smoking or illicit drug use    COMMUNITY REFERRALS UPON DISCHARGE:    Home Health:   PT, OT, RN  Agency:[facility]  Phone:[phone]   Date of last service:02/06/2017    Medical Equipment/Items Ordered:WHEELCHAIR & BEDSIDE COMMODE  Agency/Supplier:[facility]   [phone] Other:[employer] AND [employer]ICA[employer]-ANTOINETTE HARRIS-FINANCIAL COUNSELOR- [phone]   STROKE/TIA DISCHARGE INSTRUCTIONS SMOKING Cigarette smoking nearly doubles your risk of having a stroke & is the single most alterable risk factor  If you smoke or have smoked in the last 12 months, you are advised to quit smoking for your health.  Most of the excess cardiovascular risk related to smoking disappears within a year of stopping.  Ask you doctor about anti-smoking medications  Juab Quit Line: 1-800-QUIT NOW  Free Smoking Cessation Classes [phone]  CHOLESTEROL Know your levels; limit fat & cholesterol in your diet  Lipid Panel     Component Value Date/Time   CHOL 190 02/02/2017 0454   TRIG 354 (H) 02/02/2017 0454   HDL 29 (L) 02/02/2017 0454   CHOLHDL 6.6 02/02/2017 0454   VLDL 71 (H) 02/02/2017 0454   LDLCALC 90 02/02/2017 0454      Many patients benefit from treatment  even if their cholesterol is at goal.  Goal: Total Cholesterol (CHOL) less than 160  Goal:  Triglycerides (TRIG) less than 150  Goal:  HDL greater than 40  Goal:  LDL (LDLCALC) less than 100   BLOOD PRESSURE American Stroke Association blood pressure target is less that 120/80 mm/Hg  Your discharge blood pressure is:  BP: 140/75  Monitor your blood pressure  Limit your salt and alcohol intake  Many individuals will require more than one medication for high blood pressure  DIABETES (A1c is a blood sugar average for last 3 months) Goal HGBA1c is under 7% (HBGA1c is blood sugar average for last 3 months)  Diabetes:     Lab Results  Component Value Date   HGBA1C 9.9 (H) 02/02/2017     Your HGBA1c can be lowered with medications, healthy diet, and exercise.  Check your blood sugar as directed by your physician  Call your physician if you experience unexplained or low blood sugars.  PHYSICAL ACTIVITY/REHABILITATION Goal is 30 minutes at least 4 days per week  Activity: Increase activity slowly, Therapies: Physical Therapy: Home Health Return to work:   Activity decreases your risk of heart attack and stroke and makes your heart stronger.  It helps control your weight and blood pressure; helps you relax and can improve your mood.  Participate in a regular exercise program.  Talk with your doctor about the best form of exercise for you (dancing, walking, swimming, cycling).  DIET/WEIGHT Goal is to maintain a healthy weight  Your discharge diet is: Diet heart healthy/carb modified Room service appropriate? Yes;  Fluid consistency: Thin  liquids Your height is:  Height: 6\' 2"  (188 cm) Your current weight is: Weight: 97.5 kg (215 lb) Your Body Mass Index (BMI) is:  BMI (Calculated): 27.7  Following the type of diet specifically designed for you will help prevent another stroke.  Your goal weight range is:    Your goal Body Mass Index (BMI) is 19-24.  Healthy food habits can  help reduce 3 risk factors for stroke:  High cholesterol, hypertension, and excess weight.  RESOURCES Stroke/Support Group:  Call [phone]   STROKE EDUCATION PROVIDED/REVIEWED AND GIVEN TO PATIENT Stroke warning signs and symptoms How to activate emergency medical system (call 911). Medications prescribed at discharge. Need for follow-up after discharge. Personal risk factors for stroke. Pneumonia vaccine given:  Flu vaccine given:  My questions have been answered, the writing is legible, and I understand these instructions.  I will adhere to these goals & educational materials that have been provided to me after my discharge from the hospital.      My questions have been answered and I understand these instructions. I will adhere to these goals and the provided educational materials after my discharge from the hospital.  Patient/Caregiver Signature _______________________________ Date __________  Clinician Signature _______________________________________ Date __________  Please bring this form and your medication list with you to all your follow-up doctor's appointments.

## 2017-02-06 NOTE — Plan of Care (Signed)
Problem: RH Balance Goal: LTG: Patient will maintain dynamic sitting balance (OT) LTG:  Patient will maintain dynamic sitting balance with assistance during activities of daily living (OT)   Outcome: Not Met (add Reason) Pt declining completion of CIR program, goal not met due to short LOS despite education provided by therapist and CIR staff to Pt Goal: LTG Patient will maintain dynamic standing with ADLs (OT) LTG:  Patient will maintain dynamic standing balance with assist during activities of daily living (OT)    Outcome: Not Met (add Reason) Pt declining completing CIR program   Problem: RH Bathing Goal: LTG Patient will bathe with assist, cues/equipment (OT) LTG: Patient will bathe specified number of body parts with assist with/without cues using equipment (position)  (OT)   Outcome: Not Met (add Reason) Pt declining completing CIR program   Problem: RH Dressing Goal: LTG Patient will perform upper body dressing (OT) LTG Patient will perform upper body dressing with assist, with/without cues (OT).   Outcome: Not Met (add Reason) Pt declining completing CIR program  Goal: LTG Patient will perform lower body dressing w/assist (OT) LTG: Patient will perform lower body dressing with assist, with/without cues in positioning using equipment (OT)   Outcome: Not Met (add Reason) Pt declining completing CIR program   Problem: RH Toileting Goal: LTG Patient will perform toileting w/assist, cues/equip (OT) LTG: Patient will perform toiletiing (clothes management/hygiene) with assist, with/without cues using equipment (OT)   Outcome: Not Met (add Reason) Pt declining completing CIR program   Problem: RH Toilet Transfers Goal: LTG Patient will perform toilet transfers w/assist (OT) LTG: Patient will perform toilet transfers with assist, with/without cues using equipment (OT)   Outcome: Not Met (add Reason) Pt declining completing CIR program   Problem: RH Memory Goal: LTG Patient will  demonstrate ability for day to day (OT) LTG:  Patient will demonstrate ability for day to day recall/carryover during activities of daily living with assist  (OT)   Outcome: Not Met (add Reason) Pt declining completing CIR program

## 2017-02-06 NOTE — Progress Notes (Signed)
Social Work Patient ID: Stanley Farley, male   DOB: 28-Feb-1976, 41 y.o.   MRN: [id number]  Met with pt who reports he wants to leave today and go home. He feels he can recover at home and will do the exercises He is given, along with home health services. Discussed therapy team feels he would benefit from staying here and if leaves today would be wheelchair level only. Dan-PA aware and is checking with MD regarding Leaving AMA or if medically stable to be discharged. Pt's wife is coming at 2:30 after work will need to attend therapies for education and learn his care prior to discharge. Will go ahead and order wheelchair and BSC. Pt will require 24 hr physical care at home, pt feels between his son's and wife they can provide this level of care. MD has responded and he feels pt can be discharged today, according to Dan-PA. Will make discharge arrangements for pt.

## 2017-02-06 NOTE — IPOC Note (Signed)
IPOC not completed as pt left AMA on hospital day #3

## 2017-02-06 NOTE — Progress Notes (Addendum)
Occupational Therapy Discharge Summary  Patient Details  Name: Stanley Farley MRN: [id number] Date of Birth: 02-12-1976  Today's Date: 02/06/2017 OT Individual Time: 3154-0086 OT Individual Time Calculation (min): 60 min    Patient has met 3 of 12 long term goals due to improved balance and ability to compensate for deficits.  Patient to discharge at overall level of MinA for UB ADLs, MaxA LB ADLs.  Patient's care partner requires assistance to provide the necessary physical assistance at discharge.    Reasons goals not met: Pt declined completing CIR program despite education and Max encouragement from therapy and CIR staff which included benefits of CIR and safety need for completing program vs returning home as Pt is currently a high fall risk. This education was provided throughout Pt's stay to Pt and to Pt's spouse. Pt continues to wish to return home. At this time Pt currently requires MinA for squat pivot transfers, MinA with upper body ADLs and MaxA for LB ADLs at sit<>stand level. Pt remains a high fall risk at this time. Recommend Pt receive 24 hr supervision after return home to maximize safety during ADL and functional mobility completion.   Recommendation:  Patient will benefit from ongoing skilled OT services in home health setting to continue to advance functional skills in the area of BADL and Reduce care partner burden.  Equipment: Drop Arm BSC  Reasons for discharge: Pt requesting to return home and declining completion of CIR program  Patient/family agrees with progress made and goals achieved: Yes  OT Discharge Precautions/Restrictions  Precautions Precautions: Fall Restrictions Weight Bearing Restrictions: No General   Vital Signs  Pain Pain Assessment Pain Assessment: No/denies pain ADL ADL ADL Comments: Please see functional navigator  Vision Baseline Vision/History: No visual deficits Patient Visual Report: No change from baseline Vision Assessment?:  No apparent visual deficits Perception  Perception: Within Functional Limits Praxis Praxis: Intact Cognition Overall Cognitive Status: Within Functional Limits for tasks assessed Arousal/Alertness: Awake/alert Orientation Level: Oriented X4 Attention: Selective;Focused;Sustained Focused Attention: Appears intact Sustained Attention: Appears intact Selective Attention: Appears intact Memory: Appears intact Awareness: Appears intact Problem Solving: Appears intact Executive Function: Reasoning;Sequencing;Decision Making Decision Making: Appears intact Behaviors: Impulsive Safety/Judgment: Other (comment) (impulsive) Sensation Sensation Light Touch: Appears Intact Proprioception: Appears Intact Coordination Gross Motor Movements are Fluid and Coordinated: No Fine Motor Movements are Fluid and Coordinated: No Coordination and Movement Description: L hemi UE>LE Finger Nose Finger Test: WFL R UE; unable to test L UE Motor  Motor Motor: Hemiplegia Motor - Skilled Clinical Observations: L hemiparesis UE > LE Mobility  Bed Mobility Bed Mobility: Supine to Sit Supine to Sit: HOB elevated;5: Supervision Sitting - Scoot to Edge of Bed: 5: Supervision Sitting - Scoot to [employer] of Bed Details: Verbal cues for technique;Verbal cues for precautions/safety Transfers Sit to Stand: 4: Min assist Sit to Stand Details: Verbal cues for sequencing;Verbal cues for technique;Verbal cues for precautions/safety;Tactile cues for weight shifting;Manual facilitation for weight shifting;Tactile cues for posture Stand to Sit: 4: Min assist Stand to Sit Details (indicate cue type and reason): Verbal cues for technique;Verbal cues for precautions/safety;Manual facilitation for weight shifting;Tactile cues for placement;Tactile cues for weight shifting  Trunk/Postural Assessment  Cervical Assessment Cervical Assessment: Within Functional Limits Thoracic Assessment Thoracic Assessment: Within Functional  Limits Lumbar Assessment Lumbar Assessment: Within Functional Limits Postural Control Postural Control:  (decreased trunk control )  Balance Balance Balance Assessed: Yes Static Sitting Balance Static Sitting - Balance Support: Feet supported Static Sitting - Level of Assistance:  5: Stand by assistance Dynamic Sitting Balance Dynamic Sitting - Balance Support: During functional activity Dynamic Sitting - Level of Assistance: 3: Mod assist Static Standing Balance Static Standing - Balance Support: Right upper extremity supported Static Standing - Level of Assistance: 3: Mod assist Extremity/Trunk Assessment RUE Assessment RUE Assessment: Within Functional Limits LUE Assessment LUE Assessment: Exceptions to [facility] (trace movement in shoulder shrug, 0/5 all other movements)   See Function Navigator for Current Functional Status.  Raymondo Band 02/06/2017, 3:25 PM

## 2017-02-06 NOTE — Progress Notes (Signed)
Physical Therapy Session Note  Patient Details  Name: Stanley Farley MRN: [id number] Date of Birth: 01/06/1976  Today's Date: 02/06/2017 PT Individual Time: 1100-1200 PT Individual Time Calculation (min): 60 min   Short Term Goals: Week 1:  PT Short Term Goal 1 (Week 1): Pt will ambulate 3035ft with LRAD with mod A PT Short Term Goal 2 (Week 1): Pt will perform transfers with min A  PT Short Term Goal 3 (Week 1): Pt will ascend/descend 4 stairs with mod A  PT Short Term Goal 4 (Week 1): Pt will perform bed mobility with supervision  Skilled Therapeutic Interventions/Progress Updates:     no c/o pain. PT treatment session focused on pt education with PT goals of inpatient rehab, L LE NMR during functional activities, and L LE strengthening.  Pt supine in bed upon arrival. Pt expressed desire to go home due to increased fatigue and "getting no sleep" since hospitalization. Pt education on goals of therapy, CLOF, burden of care for family, and benefits of intensive rehabilitation. Pt stating still wants to go home against therapy recommendation, but agreeable to PT treatment session. Supine to sit from flat bed and no bed rails with posterior LOB requiring mod A for recovery. Squat pivot transfers bed <> w/c <> mat throughout session with close supervision for safety. W/c mobility 17750ft with R hemi-technique to/from therapy gym with supervision for safety. Sit to supine on mat with supervision for safety. Bridging with L LE preference, ball squeeze hip adduction and heel slides with lateral knee control x 10 each. Supine to sit with supervision for safety. Sit to stand x10 from mat with mod-max A for balance and lifting with B UEs on L knee for increased L LE weight bearing. Ambulated forward/backwards x 1 length of parallel bars with B UE and 1 PT managing L UE placement on bar and 1 PT providing mod A for trunk control and L LE knee extension control. Pt returned to room in w/c. Stand pivot transfer  with min guard assist for steadying. Sit to supine with supervision for safety. Pt left supine in bed with call bell in reach and bed alarm on.    Therapy Documentation Precautions:  Precautions Precautions: Fall Precaution Comments: left hemiparesis Restrictions Weight Bearing Restrictions: No   See Function Navigator for Current Functional Status.   Therapy/Group: Individual Therapy  Gagandeep Pettet 02/06/2017, 5:35 PM

## 2017-02-06 NOTE — Progress Notes (Signed)
Occupational Therapy Session Note  Patient Details  Name: Stanley Farley MRN: [id number] Date of Birth: 03-06-1976  Today's Date: 02/06/2017 OT Individual Time: [id number] OT Individual Time Calculation (min): 60 min    Short Term Goals: Week 1:  OT Short Term Goal 1 (Week 1): Pt will complete 1/3 toileting tasks with steadying assist OT Short Term Goal 2 (Week 1): Pt will recall and implement hemi dressing techniques mod I OT Short Term Goal 3 (Week 1): Pt will complete toilet transfer with min A OT Short Term Goal 4 (Week 1): Pt will complete one grooming task in standing with steadying assist and assist for set-up OT Short Term Goal 5 (Week 1): Pt will don pants with mod A  Skilled Therapeutic Interventions/Progress Updates:    Pt received supine in bed, requesting to speak to someone as Pt reports he is not staying here and wants/plans to go home. Pt repeatedly states he is going home today throughout session. Education provided on importance and benefits of CIR stay in order to increase safety and independence prior to return home as well as to decrease level of assist needed from caregivers at home. After initial discussion Pt remains agreeable to OT tx session with continued education provided throughout on CIR process/benefits and OT purpose (RN also notified). Pt comes to sitting EOB to finish eating breakfast and don overhead shirt. Pt utilizes hemi technique, one LOB to L side while donning shirt with Pt able to bring trunk back into upright position utilizing elevated HOB for assist. Pt completes squat pivot t/f to w/c to R with MinA, declines bathing but agreeable to grooming ADLs. Pt completes with increased time and education provided on using LUE as gross stabilizer to open containers and complete tasks. Pt dons socks/shoes with setup for socks and multiple attempts to complete figure 4 to don socks, able to complete with increased time. Pt dons shoes in similar fashion with therapist  assist to fasten. Pt completes sit<>stand at hemi walker x2 times with ModA, progressed to MinA and with therapist facilitating shifting wt into L LE while blocking L knee buckle. Educated Pt on LUE self-rom exercises and handout provided with Pt return demonstrating motions and min verbal cues for technique. Pt left seated in w/c, friend present to visit, refusing use of QRB with NT made aware. Call bell and needs within reach.   Therapy Documentation Precautions:  Precautions Precautions: Fall Restrictions Weight Bearing Restrictions: No   Pain: Pain Assessment Pain Assessment: No/denies pain  See Function Navigator for Current Functional Status.   Therapy/Group: Individual Therapy  Stanley PennerBreanna L Ilaria Farley 02/06/2017, 12:51 PM

## 2017-02-06 NOTE — Plan of Care (Signed)
Problem: RH Balance Goal: LTG Patient will maintain dynamic sitting balance (PT) LTG:  Patient will maintain dynamic sitting balance with assistance during mobility activities (PT)  Outcome: Not Met (add Reason) Pt LOS much shorter than recommended by therapy team.  Goal: LTG Patient will maintain dynamic standing balance (PT) LTG:  Patient will maintain dynamic standing balance with assistance during mobility activities (PT)  Outcome: Not Met (add Reason) Pt LOS much shorter than recommended by therapy team.   Problem: RH Bed Mobility Goal: LTG Patient will perform bed mobility with assist (PT) LTG: Patient will perform bed mobility with assistance, with/without cues (PT).  Outcome: Not Met (add Reason) Pt LOS much shorter than recommended by therapy team.   Problem: RH Bed to Chair Transfers Goal: LTG Patient will perform bed/chair transfers w/assist (PT) LTG: Patient will perform bed/chair transfers with assistance, with/without cues (PT).  Outcome: Not Met (add Reason) Pt LOS much shorter than recommended by therapy team.   Problem: RH Car Transfers Goal: LTG Patient will perform car transfers with assist (PT) LTG: Patient will perform car transfers with assistance (PT).  Outcome: Not Met (add Reason) Pt LOS much shorter than recommended by therapy team.   Problem: RH Ambulation Goal: LTG Patient will ambulate in controlled environment (PT) LTG: Patient will ambulate in a controlled environment, # of feet with assistance (PT).  Outcome: Not Met (add Reason) Pt LOS much shorter than recommended by therapy team.  Goal: LTG Patient will ambulate in home environment (PT) LTG: Patient will ambulate in home environment, # of feet with assistance (PT).  Outcome: Not Met (add Reason) Pt LOS much shorter than recommended by therapy team.   Problem: RH Wheelchair Mobility Goal: LTG Patient will propel w/c in home environment (PT) LTG: Patient will propel wheelchair in home  environment, # of feet with assistance (PT).  Outcome: Not Met (add Reason) Pt LOS much shorter than recommended by therapy team.   Problem: RH Stairs Goal: LTG Patient will ambulate up and down stairs w/assist (PT) LTG: Patient will ambulate up and down # of stairs with assistance (PT)  Outcome: Not Met (add Reason) Pt LOS much shorter than recommended by therapy team.

## 2017-02-06 NOTE — Progress Notes (Signed)
Social Work Assessment and Plan Social Work Assessment and Plan  Patient Details  Name: Stanley Farley MRN: [id number] Date of Birth: 1975/12/06  Today's Date: 02/06/2017  Problem List:  Patient Active Problem List   Diagnosis Date Noted  . 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 Poorly controlled type 2 diabetes mellitus with peripheral neuropathy (HCC)   . Benign essential HTN   . Tobacco abuse   . Marijuana abuse   . Hyperlipidemia   . H/O medication noncompliance   . Acute CVA (cerebrovascular accident) (HCC) 02/01/2017   Past Medical History:  Past Medical History:  Diagnosis Date  . Diabetes mellitus without complication (HCC)   . Hypertension    Past Surgical History:  Past Surgical History:  Procedure Laterality Date  . TEE WITHOUT CARDIOVERSION N/A 02/04/2017   Procedure: TRANSESOPHAGEAL ECHOCARDIOGRAM (TEE);  Surgeon: Antonieta IbaGollan, Timothy J, MD;  Location: [facility] ORS;  Service: Cardiovascular;  Laterality: N/A;   Social History:  reports that he has been smoking Cigars.  He has smoked for the past 5.00 years. He has never used smokeless tobacco. He reports that he uses drugs, including Marijuana. He reports that he does not drink alcohol.  Family / Support Systems Marital Status: Married Patient Roles: Spouse, Parent, Other (Comment) (employee) Spouse/Significant Other: Stanley Farley [phone] Children: Three son's ages-21,18,17 Other Supports: Friends Anticipated Caregiver: Wife and family Ability/Limitations of Caregiver: Wife works but mom or son's can be there Caregiver Availability: 24/7 Family Dynamics: Pt is close with his family and would od anything for them. He feels they would do the same for him and will be when he goes home. Pt feels he can progress just as quickly at home and be more comfortable in his own environment  Social History Preferred language:  English Religion: None Cultural Background: No issues Education: High School Read: Yes Write: Yes Employment Status: Unemployed Date Retired/Disabled/Unemployed: Works on Company secretarymotorcycles Legal Hisotry/Current Legal Issues: No issues Guardian/Conservator: none-according to MD pt is capable of making his own decisions while here   Abuse/Neglect Physical Abuse: Denies Verbal Abuse: Denies Sexual Abuse: Denies Exploitation of patient/patient's resources: Denies Self-Neglect: Denies  Emotional Status Pt's affect, behavior adn adjustment status: Pt feels like he can go home and do what he is doing here. He feels he can sleep better and will be happier. He is asking MD to go home today. Wife to be in this afternoon to go through therapies with pt. Recent Psychosocial Issues: other health issues-pt doesn;t acknowledge they contributed to his stroke. Pyschiatric History: No history would have benefited from seeing neuro-psych but being discharged today. Substance Abuse History: Tobacco and marijuana-feels unsure about if plans to continue this  Patient / Family Perceptions, Expectations & Goals Pt/Family understanding of illness & functional limitations: Pt and wife can acknowledge he had a stroke, he feels it was aobut his HTN but not his diabetes. He feels he was in good health before coming in here. Pt has spoken with the MD and has heard his recommendations.  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, father, son, Curatormechanic, freind, etc Anticipated changes in roles/activities/participation: resume Pt/family expectations/goals: Pt states: " I plan to go home today, God will heal me."  Wife states: " I will do what he wants to do to make him happy."  [employer] Community Agencies: Other (Comment) (has gone to [facility] before not for years though) Premorbid Home Care/DME Agencies: None  Transportation available at discharge: Wife and freinds  Discharge Planning Living  Arrangements: Spouse/significant other, Children Support Systems: Spouse/significant other, Children, Parent, Other relatives, Friends/neighbors Type of Residence: Private residence Insurance Resources: Customer service managerelf-pay Financial Resources: Family Support Financial Screen Referred: Yes Living Expenses: Database administrator[employer]gage Money Management: Patient, Spouse Does the patient have any problems obtaining your medications?: Yes (Describe) (uninsured applying for Medicaid) Home Management: Wife Patient/Family Preliminary Plans: Return home with wife and sons's to assist him with his care. Pt is in somewhat denial thinking he can do more than he can. Currently he is wheelchair level and requies 24 hr physical assist. he will be a high risk to fall at home if he ambulates against the therapy team's advice. Sw Barriers to Discharge: Medication compliance Sw Barriers to Discharge Comments: Non-compliant wiht his medical management-HTN and diabetes Social Work Anticipated Follow Up Needs: HH/OP  Clinical Impression Pleasant gentleman who is wanting to go home and not do what is the best for him. He feels he can progress as quickly at home as here. Aware will not get the intensive therapies at home, he feels will sleep Better and be in his own environment. Encouraged him to quit tobacco and marijuana and to take his medications and insulin for his diabetes. He is at high risk to return to the hospital.  Lucy ChrisDupree, Destin Kittler G 02/06/2017, 1:55 PM

## 2017-02-09 ENCOUNTER — Telehealth: Payer: Self-pay | Admitting: *Deleted

## 2017-02-09 ENCOUNTER — Inpatient Hospital Stay (HOSPITAL_COMMUNITY): Payer: Self-pay | Admitting: Physical Therapy

## 2017-02-09 LAB — FACTOR 5 LEIDEN

## 2017-02-09 NOTE — Telephone Encounter (Signed)
Transitional care call completed, appointment confirmed, however, patient may not be able to make appt on short notice.  Advised to call back and speak with front office staff for possible reschedule. New visit packet was not mailed due to short notice, patient advised to come 30 minutes early to fill out paperwork.  Transitional Care Questions   Questions for our staff to ask patients on Transitional care 48 hour phone call:   1. Are you/is patient experiencing any problems since coming home? No  Are there any questions regarding any aspect of care? No  2. Are there any questions regarding medications administration/dosing? No  Are meds being taken as prescribed?  Yes Patient should review meds with caller to confirm   3. Have there been any falls?  No  4. Has Home Health been to the house and/or have they contacted you? Yes, nurse visit, PT visit, OT visit If not, have you tried to contact them? Can we help you contact them?   5. Are bowels and bladder emptying properly? Yes Are there any unexpected incontinence issues? No If applicable, is patient following bowel/bladder programs?   6. Any fevers, problems with breathing, unexpected pain? No, No, No  7. Are there any skin problems or new areas of breakdown?   No  8. Has the patient/family member arranged specialty MD follow up (ie cardiology/neurology/renal/surgical/etc)? They will call Dr. Roda ShuttersXu, Dr. Letta PateAycock has been arranged Can we help arrange?  No  9. Does the patient need any other services or support that we can help arrange?  No  10. Are caregivers following through as expected in assisting the patient? A little overwelmed, getting better  11. Has the patient quit smoking, drinking alcohol, or using drugs as recommended? Yes, patient has quit doing these things

## 2017-02-10 ENCOUNTER — Encounter: Payer: Self-pay | Admitting: Physical Medicine & Rehabilitation

## 2017-02-10 ENCOUNTER — Encounter: Payer: Medicaid Other | Attending: Physical Medicine & Rehabilitation

## 2017-02-20 DIAGNOSIS — Z794 Long term (current) use of insulin: Secondary | ICD-10-CM

## 2017-02-20 DIAGNOSIS — F17210 Nicotine dependence, cigarettes, uncomplicated: Secondary | ICD-10-CM

## 2017-02-20 DIAGNOSIS — E785 Hyperlipidemia, unspecified: Secondary | ICD-10-CM

## 2017-02-20 DIAGNOSIS — E119 Type 2 diabetes mellitus without complications: Secondary | ICD-10-CM

## 2017-02-20 DIAGNOSIS — I69354 Hemiplegia and hemiparesis following cerebral infarction affecting left non-dominant side: Secondary | ICD-10-CM

## 2017-02-20 DIAGNOSIS — Z7982 Long term (current) use of aspirin: Secondary | ICD-10-CM

## 2017-02-20 DIAGNOSIS — I10 Essential (primary) hypertension: Secondary | ICD-10-CM

## 2017-02-20 DIAGNOSIS — I69322 Dysarthria following cerebral infarction: Secondary | ICD-10-CM

## 2017-02-20 DIAGNOSIS — Z9119 Patient's noncompliance with other medical treatment and regimen: Secondary | ICD-10-CM

## 2017-02-20 DIAGNOSIS — F1210 Cannabis abuse, uncomplicated: Secondary | ICD-10-CM

## 2017-08-13 ENCOUNTER — Other Ambulatory Visit: Payer: Self-pay | Admitting: Family Medicine

## 2017-08-13 DIAGNOSIS — R2232 Localized swelling, mass and lump, left upper limb: Secondary | ICD-10-CM

## 2017-08-14 ENCOUNTER — Ambulatory Visit: Payer: Medicaid Other

## 2017-08-19 ENCOUNTER — Ambulatory Visit
Admission: RE | Admit: 2017-08-19 | Discharge: 2017-08-19 | Disposition: A | Discharge status: Home / Self Care | Payer: Medicaid Other | Source: Ambulatory Visit | Attending: Family Medicine | Admitting: Family Medicine

## 2017-08-19 DIAGNOSIS — R2232 Localized swelling, mass and lump, left upper limb: Secondary | ICD-10-CM | POA: Diagnosis present

## 2017-08-19 DIAGNOSIS — Z8673 Personal history of transient ischemic attack (TIA), and cerebral infarction without residual deficits: Secondary | ICD-10-CM | POA: Diagnosis present

## 2017-11-25 ENCOUNTER — Encounter: Payer: Self-pay | Admitting: Physical Therapy

## 2017-11-25 ENCOUNTER — Other Ambulatory Visit: Payer: Self-pay

## 2017-11-25 ENCOUNTER — Ambulatory Visit: Payer: Medicaid Other | Attending: Family Medicine | Admitting: Physical Therapy

## 2017-11-25 DIAGNOSIS — G8929 Other chronic pain: Secondary | ICD-10-CM | POA: Insufficient documentation

## 2017-11-25 DIAGNOSIS — M25512 Pain in left shoulder: Secondary | ICD-10-CM | POA: Insufficient documentation

## 2017-11-25 DIAGNOSIS — M6281 Muscle weakness (generalized): Secondary | ICD-10-CM | POA: Diagnosis present

## 2017-11-25 DIAGNOSIS — I6300 Cerebral infarction due to thrombosis of unspecified precerebral artery: Secondary | ICD-10-CM | POA: Diagnosis not present

## 2017-11-25 NOTE — Therapy (Addendum)
[facility] MAIN [facility] SERVICES [address] [place], [place], [address] Phone: [phone]   Fax:  [phone]  Physical Therapy Evaluation  Patient Details  Name: Stanley Farley MRN: [id number] Date of Birth: Apr 18, 1976 Referring Provider: Emogene Morgan    Encounter Date: 11/25/2017  PT End of Session - 11/25/17 1405    Visit Number  1    Number of Visits  25    Date for PT Re-Evaluation  02/17/18    PT Start Time  0150    PT Stop Time  0250    PT Time Calculation (min)  60 min    Activity Tolerance  Patient tolerated treatment well;Patient limited by pain    Behavior During Therapy  [facility] for tasks assessed/performed       Past Medical History:  Diagnosis Date  . Diabetes mellitus without complication (HCC)   . Hypertension     Past Surgical History:  Procedure Laterality Date  . TEE WITHOUT CARDIOVERSION N/A 02/04/2017   Procedure: TRANSESOPHAGEAL ECHOCARDIOGRAM (TEE);  Surgeon: Antonieta Iba, MD;  Location: [facility] ORS;  Service: Cardiovascular;  Laterality: N/A;    There were no vitals filed for this visit.   Subjective Assessment - 11/25/17 1407    Subjective  Patient is 42 years old and had a CVA 01/22/18. He is not happy with how he is not able to use his left arm. He is having pain in his left shoulder that hurts when he tries to use his arm.     Pertinent History  Patient is 42 years old and had a CVA 02/01/17 . He was at [facility] for 3 days and then  in 7 days. He had HHPT for  1 month following the hospital stay. Patient noticed that over time he has been able to do less and less and it is getting weaker. He is not able to move his arm wihtout pain.     Limitations  Lifting;House hold activities    Patient Stated Goals  Patient wants to use his left hand more and have less pain .    Currently in Pain?  Yes    Pain Score  1     Pain Location  Shoulder    Pain Orientation  Left    Pain Descriptors / Indicators   Burning;Discomfort;Dull pinching    Pain Type  Chronic pain    Pain Onset  More than a month ago    Pain Frequency  Intermittent    Aggravating Factors   anytime he tries to use his arm,or if he tries to go to sleep    Pain Relieving Factors  if he does not try to use it.    Effect of Pain on Daily Activities  unable to          [facility] PT Assessment - 11/25/17 1359      Assessment   Medical Diagnosis  left shoulder pain    Referring Provider  Letta Pate, NGWE A     Onset Date/Surgical Date  11/10/17    Hand Dominance  Right    Prior Therapy  HHPT       Precautions   Precautions  None      Restrictions   Weight Bearing Restrictions  No      Balance Screen   Has the patient fallen in the past 6 months  No    Has the patient had a decrease in activity level because of a fear  of falling?   Yes    Is the patient reluctant to leave their home because of a fear of falling?   No      Home Environment   Living Environment  Private residence    Living Arrangements  Spouse/significant other    Available Help at Discharge  Family    Type of Home  House    Home Access  Stairs to enter    Entrance Stairs-Number of Steps  11    Entrance Stairs-Rails  Right;Left;Cannot reach both    Home Layout  One level    Home Equipment  Wheelchair - manual hemi walker      Prior Function   Level of Independence  Independent with basic ADLs;Independent with gait    Vocation  On disability    Leisure  walk around yard, church      Cognition   Overall Cognitive Status  Within Functional Limits for tasks assessed          POSTURE:  left shoulder rounded and  depressed   Accessory motions left shoulder hypomobile anterior  glide with  pain.   PROM/AROM: L shoulder impaired with ROM and strenght  STRENGTH:  Graded on a 0-5 scale Muscle Group Left Right  Shoulder flex -2/5 5/5  Shoulder Abd -2/5 5/5  Shoulder Ext -2/5 5/5  Shoulder IR/ER -2/5 5/5  Elbow -2/5 5/5  Wrist/hand -2/5 5/5   supination left 60 deg and pronation is WFL  STRENGTH:  Graded on a 0-5 scale Muscle Group Left Right  Shoulder flex 118   Shoulder Abd 103   Shoulder Ext    Shoulder IR/ER 38/0   Elbow WFL   Wrist/hand 60/60                                        SENSATION: no numbness or tingling BLE and BUE  FUNCTIONAL MOBILITY: independent without device  GAIT: Independent without device  OUTCOME MEASURES: TEST Outcome Interpretation      Quick dash  63.63 0= no disabiity                            Objective measurements completed on examination: See above findings.              PT Education - 11/25/17 1404    Education provided  Yes    Education Details  plan of care    Person(s) Educated  Patient    Methods  Explanation    Comprehension  Verbalized understanding       PT Short Term Goals - 11/25/17 1504      PT SHORT TERM GOAL #1   Title   Patient will decrease Quick DASH score by > 8 points demonstrating reduced self-reported upper extremity disability.    Baseline  63.63 %    Time  6    Period  Weeks    Status  New    Target Date  01/06/18        PT Long Term Goals - 11/25/17 1500      PT LONG TERM GOAL #1   Title  Patient will be independent in home exercise program to improve strength/mobility for better functional independence with ADLs.    Time  12    Period  Weeks    Status  New    Target  Date  02/17/18      PT LONG TERM GOAL #2   Title  Patient will increase BLE gross strength to 4+/5 as to improve functional strength for independent gait, increased standing tolerance and increased ADL ability.    Baseline  -2/5 left shoulder, left elbow, left hand    Time  12    Period  Weeks    Status  New    Target Date  02/17/18      PT LONG TERM GOAL #3   Title  Patient will be able to perform household work/ chores without increase in symptoms.    Baseline  10/10 pain left shoulder    Time  12    Period  Weeks    Status  New     Target Date  02/17/18      PT LONG TERM GOAL #4   Title  Patient will report a worst pain of 3/10 on VAS in left shoulder to improve tolerance with ADLs and reduced symptoms with activities.     Time  12    Period  Weeks    Status  New    Target Date  02/17/18             Plan - 11/25/17 1417    Clinical Impression Statement  Patient is 41 and has pain in left shoulder that is intermittent and ranges from 0/10 to 10/10 . He has decreased ROM and decreased strength to left shoulder., elbow and wrist has -2/5 strength to left shoulder flex and abd, -2/5 elbow flex and ext, patient has 2/5 supination / pronation, and 2/5 wrist flex/ extension, decreased grip and finger to thumb opposition left hand. Patient has decreased PROM in left shoulder, elbow and wrist. He has tenderness to palpation to scapula musculature. He is not able to sleep very well due to left shoulder pain and is not able to carry, lift or use his left arm for ADL's.    History and Personal Factors relevant to plan of care:  pain fluxuating     Clinical Presentation  Evolving    Rehab Potential  Fair    PT Frequency  2x / week    PT Treatment/Interventions  Aquatic Therapy;Moist Heat;Ultrasound;Electrical Stimulation;Cryotherapy;ADLs/Self Care Home Management;Therapeutic activities;Therapeutic exercise;Patient/family education;Passive range of motion    PT Next Visit Plan  PROM, pulley, manual therapy left shoulder    Consulted and Agree with Plan of Care  Patient       Patient will benefit from skilled therapeutic intervention in order to improve the following deficits and impairments:  Decreased strength, Pain, Postural dysfunction, Impaired UE functional use, Impaired flexibility, Decreased coordination, Decreased activity tolerance, Decreased range of motion, Hypomobility  Visit Diagnosis: 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 Poorly controlled type 2 diabetes mellitus with peripheral neuropathy (HCC)   . Benign essential HTN   . Tobacco abuse   . Marijuana abuse   . Hyperlipidemia   . H/O medication noncompliance   . Acute CVA (cerebrovascular accident) (HCC) 02/01/2017    Ezekiel Ina, PT DPT  11/25/2017, 4:02 PM  [facility] [facility] MAIN [facility] SERVICES [address] [place], [place], [address] Phone: [phone]   Fax:  [phone]  Name: Stanley Farley MRN: [id number] Date of Birth: 1975/11/22

## 2017-12-02 ENCOUNTER — Ambulatory Visit: Payer: Medicaid Other | Admitting: Physical Therapy

## 2017-12-02 ENCOUNTER — Encounter: Payer: Self-pay | Admitting: Physical Therapy

## 2017-12-02 DIAGNOSIS — G8929 Other chronic pain: Secondary | ICD-10-CM

## 2017-12-02 DIAGNOSIS — M25512 Pain in left shoulder: Secondary | ICD-10-CM

## 2017-12-02 DIAGNOSIS — M6281 Muscle weakness (generalized): Secondary | ICD-10-CM

## 2017-12-02 DIAGNOSIS — I6300 Cerebral infarction due to thrombosis of unspecified precerebral artery: Secondary | ICD-10-CM

## 2017-12-02 NOTE — Therapy (Signed)
[facility] MAIN [facility] SERVICES [address] [place], [place], [address] Phone: [phone]   Fax:  [phone]  Physical Therapy Treatment  Patient Details  Name: Stanley Farley MRN: [id number] Date of Birth: [birth year] Referring Provider: Emogene Morgan    Encounter Date: 12/02/2017  PT End of Session - 12/02/17 1428    Visit Number  2    Number of Visits  25    Date for PT Re-Evaluation  02/17/18    PT Start Time  0220    PT Stop Time  0300    PT Time Calculation (min)  40 min    Activity Tolerance  Patient tolerated treatment well;Patient limited by pain    Behavior During Therapy  [facility] for tasks assessed/performed       Past Medical History:  Diagnosis Date  . Diabetes mellitus without complication (HCC)   . Hypertension     Past Surgical History:  Procedure Laterality Date  . TEE WITHOUT CARDIOVERSION N/A 02/04/2017   Procedure: TRANSESOPHAGEAL ECHOCARDIOGRAM (TEE);  Surgeon: Antonieta Iba, MD;  Location: [facility] ORS;  Service: Cardiovascular;  Laterality: N/A;    There were no vitals filed for this visit.  Subjective Assessment - 12/02/17 1426    Subjective  Patient does not having pain today and did take a pain pill and muscle relaxor today. He says that he cant accept that he will not get the use of his arm back. He wants to know if he will ever recover the use of his left arm.     Pertinent History  Patient is 42 years old and had a CVA 01/22/18 . He was at [facility] for 3 days and then [facility] in 7 days. He had HHPT for  1 month following the hospital stay. Patient noticed that over time he has been able to do less and less and it is getting weaker. He is not able to move his arm wihtout pain.     Limitations  Lifting;House hold activities    Patient Stated Goals  Patient wants to use his left hand more and have less pain .    Currently in Pain?  No/denies    Pain Score  0-No pain    Pain Onset  More than a month ago        Treatment:  UBE x 5 mins level 4   Pulley over the door for flex and abd ROM x 10  sidelying UE flex with table top for reduced friction x 20   Standing UE protraction with table for support x 10  Supine 1 lb rod and shoulder press x 20  Supine 1 lb rod and shoulder protraction x 20  Supine 1 lb shoulder flex with horizontal abd and horizontal add x 20   Supine 1 lb rod shoulder flex 90 deg to 120 deg x 20   Supine left elbow extension x 20 with assist  Patient has no pain and has -3/5 strength and can assist L arm with the right arm.                        PT Education - 12/02/17 1427    Education provided  Yes    Education Details  HEP for PROM and pulley    Person(s) Educated  Patient    Methods  Explanation    Comprehension  Verbalized understanding       PT Short Term Goals -  11/25/17 1504      PT SHORT TERM GOAL #1   Title   Patient will decrease Quick DASH score by > 8 points demonstrating reduced self-reported upper extremity disability.    Baseline  63.63 %    Time  6    Period  Weeks    Status  New    Target Date  01/06/18        PT Long Term Goals - 11/25/17 1500      PT LONG TERM GOAL #1   Title  Patient will be independent in home exercise program to improve strength/mobility for better functional independence with ADLs.    Time  12    Period  Weeks    Status  New    Target Date  02/17/18      PT LONG TERM GOAL #2   Title  Patient will increase BLE gross strength to 4+/5 as to improve functional strength for independent gait, increased standing tolerance and increased ADL ability.    Baseline  -2/5 left shoulder, left elbow, left hand    Time  12    Period  Weeks    Status  New    Target Date  02/17/18      PT LONG TERM GOAL #3   Title  Patient will be able to perform household work/ chores without increase in symptoms.    Baseline  10/10 pain left shoulder    Time  12    Period  Weeks    Status  New    Target  Date  02/17/18      PT LONG TERM GOAL #4   Title  Patient will report a worst pain of 3/10 on VAS in left shoulder to improve tolerance with ADLs and reduced symptoms with activities.     Time  12    Period  Weeks    Status  New    Target Date  02/17/18            Plan - 12/02/17 1428    Clinical Impression Statement  Continuous verbal cues and tactile cues needed to correct form with exercises and for posture correction. Patient required mod verbal cues to perform exercises for posture and control and required verbal and tactile cues during all activities.  Patient is able to perform strengthening exercises with reports of pain. Patient will benefit from further skilled therapy to return to prior level of function.    Rehab Potential  Fair    PT Frequency  2x / week    PT Treatment/Interventions  Aquatic Therapy;Moist Heat;Ultrasound;Electrical Stimulation;Cryotherapy;ADLs/Self Care Home Management;Therapeutic activities;Therapeutic exercise;Patient/family education;Passive range of motion    PT Next Visit Plan  PROM, pulley, manual therapy left shoulder    Consulted and Agree with Plan of Care  Patient       Patient will benefit from skilled therapeutic intervention in order to improve the following deficits and impairments:  Decreased strength, Pain, Postural dysfunction, Impaired UE functional use, Impaired flexibility, Decreased coordination, Decreased activity tolerance, Decreased range of motion, Hypomobility  Visit Diagnosis: 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 Poorly controlled type 2 diabetes mellitus with peripheral neuropathy (HCC)   . Benign essential  HTN   .  Tobacco abuse   . Marijuana abuse   . Hyperlipidemia   . H/O medication noncompliance   . Acute CVA (cerebrovascular accident) (HCC) 02/01/2017    Ezekiel Ina, PT DPT 12/02/2017, 2:50 PM  [facility] MAIN [facility] SERVICES [address] [place], [place], [address] Phone: [phone]   Fax:  [phone]  Name: Stanley Farley MRN: [id number] Date of Birth: 1976-05-18

## 2017-12-09 ENCOUNTER — Ambulatory Visit: Payer: Medicaid Other | Admitting: Physical Therapy

## 2017-12-10 ENCOUNTER — Encounter: Payer: Self-pay | Admitting: Physical Therapy

## 2017-12-10 ENCOUNTER — Ambulatory Visit: Payer: Medicaid Other | Attending: Family Medicine | Admitting: Physical Therapy

## 2017-12-10 DIAGNOSIS — M25512 Pain in left shoulder: Secondary | ICD-10-CM | POA: Insufficient documentation

## 2017-12-10 DIAGNOSIS — G8929 Other chronic pain: Secondary | ICD-10-CM

## 2017-12-10 DIAGNOSIS — M6281 Muscle weakness (generalized): Secondary | ICD-10-CM | POA: Diagnosis present

## 2017-12-10 DIAGNOSIS — I6300 Cerebral infarction due to thrombosis of unspecified precerebral artery: Secondary | ICD-10-CM | POA: Diagnosis not present

## 2017-12-10 NOTE — Therapy (Signed)
[place] [facility] MAIN [facility] SERVICES [address] [place], [place], [address] Phone: [phone]   Fax:  [phone]  Physical Therapy Treatment  Patient Details  Name: Stanley Farley MRN: [id number] Date of Birth: [birth year] Referring Provider: Emogene Morgan    Encounter Date: 12/10/2017  PT End of Session - 12/10/17 1408    Visit Number  3    Number of Visits  25    Date for PT Re-Evaluation  02/17/18    PT Start Time  1402    PT Stop Time  1430    PT Time Calculation (min)  28 min    Activity Tolerance  Patient tolerated treatment well;Patient limited by pain    Behavior During Therapy  [facility] for tasks assessed/performed       Past Medical History:  Diagnosis Date  . Diabetes mellitus without complication (HCC)   . Hypertension     Past Surgical History:  Procedure Laterality Date  . TEE WITHOUT CARDIOVERSION N/A 02/04/2017   Procedure: TRANSESOPHAGEAL ECHOCARDIOGRAM (TEE);  Surgeon: Antonieta Iba, MD;  Location: [facility] ORS;  Service: Cardiovascular;  Laterality: N/A;    There were no vitals filed for this visit.  Subjective Assessment - 12/10/17 1405    Subjective  Pt states his balance and walking is okay but wants to build more strength in his hamstrings specifically. Pt feels as though he's been able to move his LUE more but not reaching overhead yet. Pt denies any pain today.    Pertinent History  Patient is 42 years old and had a CVA 01/22/18 . He was at [facility] for 3 days and then [place] in 7 days. He had HHPT for  1 month following the hospital stay. Patient noticed that over time he has been able to do less and less and it is getting weaker. He is not able to move his arm wihtout pain.     Limitations  Lifting;House hold activities    Patient Stated Goals  Patient wants to use his left hand more and have less pain .    Currently in Pain?  No/denies    Pain Onset  More than a month ago       Treatment  TherEx Sitting,  alternating toe raises with R Tband around toes x10 reps, VCs for proper technique and sequencing  Long sitting, L SAQs with towel roll under L knee x10 reps with 5 sec holds and slow release; VCs for slow movements to promote motor control and build more control with the movement Standing knee extension with G Tband around knee, R foot in back to promote lunge position, x5 reps with VCs for proper technique Standing L shoulder extension with R Tband x5 reps with VCs for proper form Standing L low row with R Tband x5 reps with VCs for proper technique and form in motion Standing L shoulder ER with towel roll between elbow and trunk x5 reps with VCs for proper positioning and technique   Instructed in advanced HEP. See patient instruction;                PT Education - 12/10/17 1407    Education provided  Yes    Education Details  HEP and strengthening    Person(s) Educated  Patient    Methods  Explanation;Demonstration;Verbal cues    Comprehension  Verbalized understanding;Returned demonstration;Verbal cues required       PT Short Term Goals - 11/25/17 1504  PT SHORT TERM GOAL #1   Title   Patient will decrease Quick DASH score by > 8 points demonstrating reduced self-reported upper extremity disability.    Baseline  63.63 %    Time  6    Period  Weeks    Status  New    Target Date  01/06/18        PT Long Term Goals - 11/25/17 1500      PT LONG TERM GOAL #1   Title  Patient will be independent in home exercise program to improve strength/mobility for better functional independence with ADLs.    Time  12    Period  Weeks    Status  New    Target Date  02/17/18      PT LONG TERM GOAL #2   Title  Patient will increase BLE gross strength to 4+/5 as to improve functional strength for independent gait, increased standing tolerance and increased ADL ability.    Baseline  -2/5 left shoulder, left elbow, left hand    Time  12    Period  Weeks    Status  New     Target Date  02/17/18      PT LONG TERM GOAL #3   Title  Patient will be able to perform household work/ chores without increase in symptoms.    Baseline  10/10 pain left shoulder    Time  12    Period  Weeks    Status  New    Target Date  02/17/18      PT LONG TERM GOAL #4   Title  Patient will report a worst pain of 3/10 on VAS in left shoulder to improve tolerance with ADLs and reduced symptoms with activities.     Time  12    Period  Weeks    Status  New    Target Date  02/17/18            Plan - 12/10/17 1408    Clinical Impression Statement  Patient displaying motivation to gain LE strength and better control over knee motion. Patient shows more volitional movement in LUE but continues to be unable to reach overhead. Pt agreeable to progressive HEP with LE strengthening to promote motor control as well as strength. Patient interested in understanding how to gain better control of knee flexion/hamstrings and motivated to continue exercises. Patient would benefit from additional skilled PT intervention to improve strength, balance and gait safety.    Rehab Potential  Fair    PT Frequency  2x / week    PT Treatment/Interventions  Aquatic Therapy;Moist Heat;Ultrasound;Electrical Stimulation;Cryotherapy;ADLs/Self Care Home Management;Therapeutic activities;Therapeutic exercise;Patient/family education;Passive range of motion    PT Next Visit Plan  PROM, pulley, manual therapy left shoulder    Consulted and Agree with Plan of Care  Patient       Patient will benefit from skilled therapeutic intervention in order to improve the following deficits and impairments:  Decreased strength, Pain, Postural dysfunction, Impaired UE functional use, Impaired flexibility, Decreased coordination, Decreased activity tolerance, Decreased range of motion, Hypomobility  Visit Diagnosis: 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 Poorly controlled type 2 diabetes mellitus with peripheral  neuropathy (HCC)   . Benign essential HTN   . Tobacco abuse   . Marijuana abuse   . Hyperlipidemia   . H/O medication noncompliance   . Acute CVA (cerebrovascular accident) (HCC) 02/01/2017    Amorah Sebring PT, DPT 12/10/2017, 2:30 PM   [facility] MAIN [facility] [address] [place], [place], [address] Phone: [phone]   Fax:  [phone]  Name: Stanley Farley MRN: [id number] Date of Birth: May 09, 1976

## 2017-12-10 NOTE — Patient Instructions (Addendum)
  Copyright  VHI. All rights reserved.  FLEXION: Sitting - Resistance Band (Active)   Sit with right foot flat. Have band tied around both feet, bend ankle, bringing toes toward head. Complete __2_ sets of __10_ repetitions. Perform _2__ sessions per day.  Knee Extension: Quad Set (Eccentric), (Resistance Band)    Stand holding support, tubing behind fully extended knee. Slowly bend knee for 3-5 seconds. Use ________ resistance band. ___ reps per set, ___ sets per day, ___ days per week.  http://ecce.exer.us/132   Copyright  VHI. All rights reserved.  KNEE: Extension, Short Arc Quads - Supine    Place bolster under knees. Raise one leg until knee is straight. ___ reps per set, ___ sets per day, ___ days per week   Copyright  VHI. All rights reserved.  (Clinic) External Rotation: Resting Position    Opposite side toward pulley, squeeze roller to right side, elbow in, bent to 90, forearm across body. Rotate shoulder by pulling hand away from body. Keep elbow bent to 90, holding roller. Repeat ___10_ times per set. Do _2___ sets per session. Do __5__ sessions per week. Use red band. Copyright  VHI. All rights reserved.    Shoulder Retraction   Tie band around door knob (sitting or standing, holding band in both hands) (or use bowflex) Facing chest height anchor, grasp ends of band and pull hands to chest, squeezing shoulder blades together. Hold _3-5seconds. Repeat _10 times. Do _2_ sessions per day. Safety Note: Be sure anchor is secure.  Copyright  VHI. All rights reserved.  Strengthening: Resisted Extension   Hold band in left hands, Pull arm back, elbow straight, squeezing shoulder blades, Repeat _10___ times per set. Do _2___ sets per session. Do __1__ sessions per day.  http://orth.exer.us/833    Copyright  VHI. All rights reserved.   Roll   Inhale and bring shoulders up, back, then exhale and relax shoulders down. Repeat _10__ times. Do _2-3__  times per day.  Copyright  VHI. All rights reserved.

## 2017-12-15 ENCOUNTER — Ambulatory Visit: Payer: Medicaid Other | Admitting: Physical Therapy

## 2017-12-15 IMAGING — CT CT ANGIO HEAD
1 of 8 series · 6 of 33 positions shown · IV contrast (APPLIED)
Comparison: None.

CLINICAL DATA: Left-sided weakness and slurred speech.

EXAM:
CT ANGIOGRAPHY HEAD AND NECK
TECHNIQUE: Multidetector CT imaging of the head and neck was performed using
the standard protocol during bolus administration of intravenous
contrast. Multiplanar CT image reconstructions and MIPs were
obtained to evaluate the vascular anatomy. Carotid stenosis
measurements (when applicable) are obtained utilizing NASCET
criteria, using the distal internal carotid diameter as the
denominator.
CONTRAST:  75 mL Isovue 370

[Series 8: ax thin · axial · 0.39mm/px · z∈[+178,+428]mm · 6 of 350 slices shown]
[im 50/350  soft-tissue]
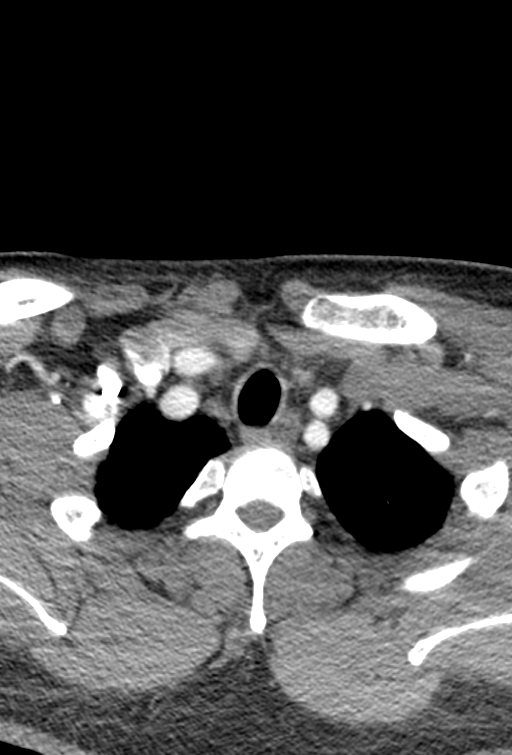
[im 100/350  bone]
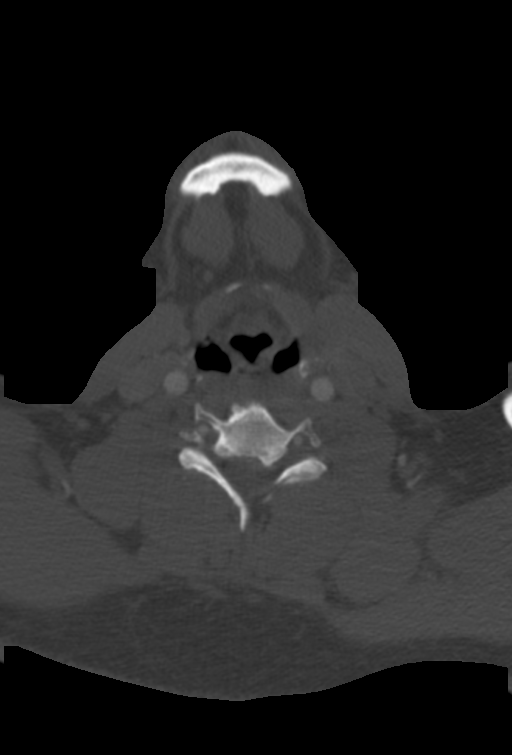
[im 150/350  soft-tissue]
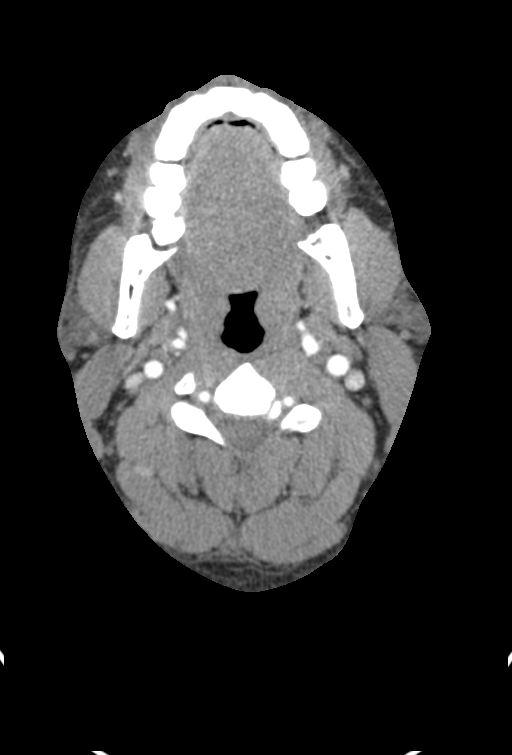
[im 200/350  bone]
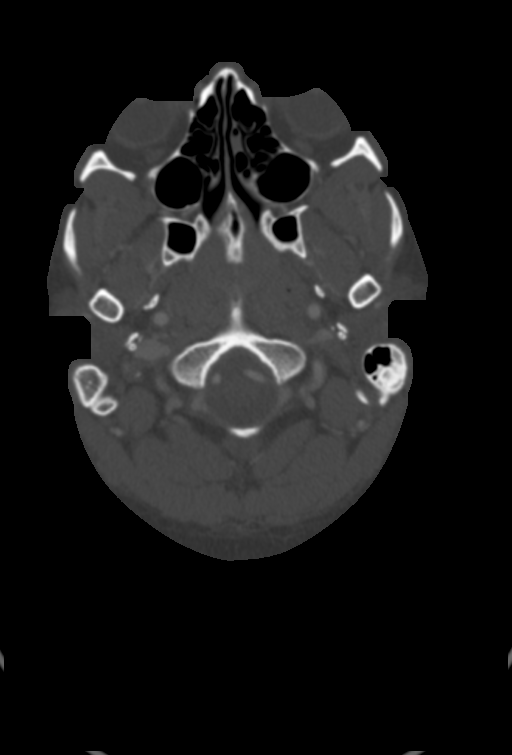
[im 250/350  soft-tissue]
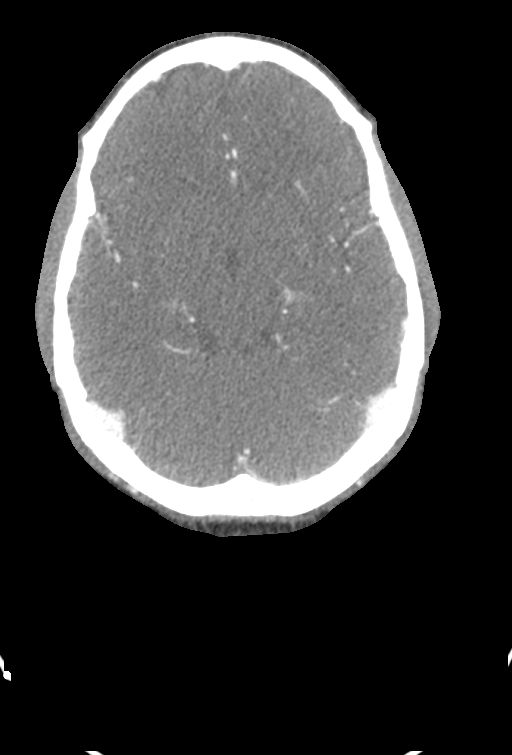
[im 300/350  bone]
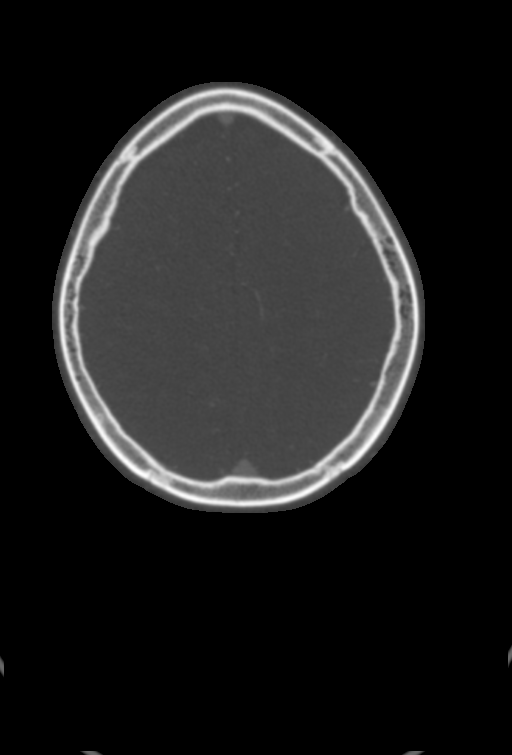

[6 of 33 positions shown; findings below may reference images not displayed]

FINDINGS: CTA NECK FINDINGS

Aortic arch: Standard 3 vessel aortic arch. Brachiocephalic and
subclavian arteries are widely patent.

Right carotid system: Patent without evidence of stenosis,
dissection, or significant atherosclerosis.

Left carotid system: Patent without evidence of stenosis,
dissection, or significant atherosclerosis.

Vertebral arteries: Patent and codominant without evidence of
stenosis, dissection, or significant atherosclerosis.

Skeleton: Mild disc space narrowing and moderate degenerative
endplate spurring at C5-6.

Other neck: No mass or lymph node enlargement. Poor dentition with
multiple caries.

Upper chest: Clear lung apices.

Review of the MIP images confirms the above findings

CTA HEAD FINDINGS

Anterior circulation: The internal carotid arteries are widely
patent from skullbase to carotid termini. The ACAs and MCAs are
patent without evidence of proximal branch occlusion or significant
proximal stenosis. Diffuse branch vessel irregularity is attributed
to technique. No aneurysm.

Posterior circulation: The intracranial vertebral arteries are
widely patent to the basilar. Right PICA, left AICA, and bilateral
SCA origins are patent. The basilar artery is widely patent. There
are patent posterior communicating arteries, right larger than left.
The right P1 segment is hypoplastic. PCAs are patent with mild
diffuse irregularity attributed to technique. No evidence of flow
limiting proximal stenosis. No aneurysm.

Venous sinuses: Patent.

Anatomic variants: Predominantly fetal contribution to the right
PCA.

Delayed phase: No abnormal enhancement.

Review of the MIP images confirms the above findings
IMPRESSION: Negative head and neck CTA. No large vessel occlusion or significant
proximal stenosis.

## 2017-12-17 ENCOUNTER — Encounter: Payer: Self-pay | Admitting: Physical Therapy

## 2017-12-17 ENCOUNTER — Ambulatory Visit: Payer: Medicaid Other | Admitting: Physical Therapy

## 2017-12-17 DIAGNOSIS — I6300 Cerebral infarction due to thrombosis of unspecified precerebral artery: Secondary | ICD-10-CM

## 2017-12-17 DIAGNOSIS — G8929 Other chronic pain: Secondary | ICD-10-CM

## 2017-12-17 DIAGNOSIS — M6281 Muscle weakness (generalized): Secondary | ICD-10-CM

## 2017-12-17 DIAGNOSIS — M25512 Pain in left shoulder: Secondary | ICD-10-CM

## 2017-12-17 NOTE — Patient Instructions (Addendum)
Closed Chain: Shoulder External Rotation - on Wall    One hand on wall, rotate shoulder by stepping outward. Step _until stretch is felt in left shoulder, (step to right) Hold for 20 sec, Do  _3__ times per day.  http://ss.exer.us/270   Copyright  VHI. All rights reserved.  External Rotation (Passive)    With elbow bent and forearm on table, palm down, bend forward at waist until a stretch is felt. Hold _20___ seconds. Repeat __3__ times. Do _3___ sessions per day.  Copyright  VHI. All rights reserved.

## 2017-12-17 NOTE — Therapy (Signed)
[place] MAIN [facility] SERVICES [address] [place], [place], [address] Phone: [phone]   Fax:  [phone]  Physical Therapy Treatment Physical Therapy Progress Note   Dates of reporting period  11/25/17   to   12/17/17   Patient Details  Name: Stanley Farley MRN: [id number] Date of Birth: [birth year] Referring Provider: Donnie Coffin    Encounter Date: 12/17/2017  PT End of Session - 12/17/17 1626    Visit Number  4    Number of Visits  25    Date for PT Re-Evaluation  02/17/18    PT Start Time  1618    PT Stop Time  1650    PT Time Calculation (min)  32 min    Activity Tolerance  Patient tolerated treatment well;Patient limited by pain    Behavior During Therapy  [facility] for tasks assessed/performed       Past Medical History:  Diagnosis Date  . Diabetes mellitus without complication ([place])   . Hypertension     Past Surgical History:  Procedure Laterality Date  . TEE WITHOUT CARDIOVERSION N/A 02/04/2017   Procedure: TRANSESOPHAGEAL ECHOCARDIOGRAM (TEE);  Surgeon: Minna Merritts, MD;  Location: [facility] ORS;  Service: Cardiovascular;  Laterality: N/A;    There were no vitals filed for this visit.  Subjective Assessment - 12/17/17 1625    Subjective  Patient reports a little soreness in left shoulder; reports compliance with HEP;     Pertinent History  Patient is 42 years old and had a CVA 01/22/18 . He was at [facility] for 3 days and then [place] in 7 days. He had HHPT for  1 month following the hospital stay. Patient noticed that over time he has been able to do less and less and it is getting weaker. He is not able to move his arm wihtout pain.     Limitations  Lifting;House hold activities    Patient Stated Goals  Patient wants to use his left hand more and have less pain .    Currently in Pain?  Yes    Pain Score  2     Pain Location  Shoulder    Pain Orientation  Left    Pain Descriptors / Indicators  Aching;Burning    Pain  Type  Chronic pain    Pain Onset  More than a month ago    Pain Frequency  Intermittent    Aggravating Factors   movement, position at night during sleep    Pain Relieving Factors  rest/    Effect of Pain on Daily Activities  decreased activity;     Multiple Pain Sites  No       TREATMENT: Warm up on UBE: BUE level 2 x3 min (forward/backward) unbilled;  Instructed patient in Quick DASH, assessed ROM and strength to address goals, see below:  [facility] PT Assessment - 12/17/17 0001      ROM / Strength   AROM / PROM / Strength  AROM      AROM   Left Shoulder Extension  30 Degrees    Left Shoulder Flexion  75 Degrees    Left Shoulder ABduction  60 Degrees    Left Shoulder Internal Rotation  55 Degrees at 45 degrees abduction    Left Shoulder External Rotation  0 Degrees at 45 degrees abduction      Quick DASH: 65.9%, no change from 11/25/17: 63%  Patient exhibits 2-/5 strength in LUE shoulder/elbow  PT performed grade II-III Inferior, anterior/posterior joint mobs to left shoulder, 5 sec bouts x3 reps each direction;  PT performed PROM to LUE shoulder in flexion, abduction, ER x3 reps each;  Patient instructed in LUE shoulder ER stretch in sitting and standing 20 sec hold x2 reps each; Patient required min-moderate verbal/tactile cues for correct exercise technique.                      PT Education - 12/17/17 1626    Education provided  Yes    Education Details  ROM/strengthening, progress towards goals;     Person(s) Educated  Patient    Methods  Explanation;Demonstration;Verbal cues    Comprehension  Verbalized understanding;Returned demonstration;Verbal cues required;Need further instruction       PT Short Term Goals - 12/17/17 1627      PT SHORT TERM GOAL #1   Title   Patient will decrease Quick DASH score by > 8 points demonstrating reduced self-reported upper extremity disability.    Baseline  63.63 %, 12/17/17: 65.9%    Time  6    Period   Weeks    Status  Not Met    Target Date  01/06/18        PT Long Term Goals - 12/17/17 1636      PT LONG TERM GOAL #1   Title  Patient will be independent in home exercise program to improve strength/mobility for better functional independence with ADLs.    Baseline  reports independence and compliance;     Time  12    Period  Weeks    Status  Achieved    Target Date  02/17/18      PT LONG TERM GOAL #2   Title  Patient will increase BUE gross strength to 4+/5 as to improve functional strength for independent gait, increased standing tolerance and increased ADL ability.    Baseline  -2/5 left shoulder, left elbow, left hand; 12/17/17: 2-/5 gross strength in LUE shoulder/elbow and hand;     Time  12    Period  Weeks    Status  New    Target Date  02/17/18      PT LONG TERM GOAL #3   Title  Patient will be able to perform household work/ chores without increase in symptoms.    Baseline  10/10 pain left shoulder    Time  12    Period  Weeks    Status  Not Met    Target Date  02/17/18      PT LONG TERM GOAL #4   Title  Patient will report a worst pain of 3/10 on VAS in left shoulder to improve tolerance with ADLs and reduced symptoms with activities.     Baseline  10/10 discomfort;     Time  12    Period  Weeks    Status  Not Met    Target Date  02/17/18            Plan - 12/17/17 1655    Clinical Impression Statement  Patient late to session; He reports continued discomfort in LUE shoulder. Patient reports adherence/compliance to HEP. Patient reports difficulty sleeping due to shoulder discomfort. He exhibits no change in LUE shoulder ROM and actually exhibits increased stiffness in flexion and abduction as compared to initial eval. He also exhibits no change in Quick DASH score. Patient has not met goals. Concerned about patient's lack of improvement. He reports compliance with HEP  and he has extensive HEP for shoulder ROM however objective findings do not show progress.  Patient's condition has the potential to improve in response to therapy. Maximum improvement is yet to be obtained. The anticipated improvement is attainable and reasonable in a generally predictable time. Start date of reporting period 11/25/17 end date of reporting period 12/17/17. Patient reports no change in LUE shoulder discomfort and mobility.     Rehab Potential  Fair    PT Frequency  2x / week    PT Treatment/Interventions  Aquatic Therapy;Moist Heat;Ultrasound;Electrical Stimulation;Cryotherapy;ADLs/Self Care Home Management;Therapeutic activities;Therapeutic exercise;Patient/family education;Passive range of motion    PT Next Visit Plan  PROM, pulley, manual therapy left shoulder    Consulted and Agree with Plan of Care  Patient       Patient will benefit from skilled therapeutic intervention in order to improve the following deficits and impairments:  Decreased strength, Pain, Postural dysfunction, Impaired UE functional use, Impaired flexibility, Decreased coordination, Decreased activity tolerance, Decreased range of motion, Hypomobility  Visit Diagnosis: 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 Thrombotic cerebral infarction ([place]) 02/04/2017  . 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 Poorly controlled type 2 diabetes mellitus with peripheral neuropathy ([place])   . Benign essential HTN   . Tobacco abuse   . Marijuana abuse   . Hyperlipidemia   . H/O medication noncompliance   . Acute CVA (cerebrovascular accident) ([place]) 02/01/2017    Yarlin Breisch DPT 12/17/2017, 5:00 PM  [place] MAIN [facility] SERVICES [address] [place], [place], [address] Phone: [phone]   Fax:  [phone]  Name: Stanley Farley MRN:  [id number] Date of Birth: 10/04/1975

## 2017-12-30 ENCOUNTER — Ambulatory Visit: Payer: Medicaid Other | Admitting: Physical Therapy

## 2018-01-04 ENCOUNTER — Ambulatory Visit: Payer: Medicaid Other | Attending: Family Medicine | Admitting: Physical Therapy

## 2018-01-04 ENCOUNTER — Encounter: Payer: Self-pay | Admitting: Physical Therapy

## 2018-01-04 DIAGNOSIS — M25512 Pain in left shoulder: Secondary | ICD-10-CM | POA: Diagnosis present

## 2018-01-04 DIAGNOSIS — G8929 Other chronic pain: Secondary | ICD-10-CM | POA: Insufficient documentation

## 2018-01-04 DIAGNOSIS — I6300 Cerebral infarction due to thrombosis of unspecified precerebral artery: Secondary | ICD-10-CM | POA: Insufficient documentation

## 2018-01-04 DIAGNOSIS — M6281 Muscle weakness (generalized): Secondary | ICD-10-CM | POA: Diagnosis present

## 2018-01-04 NOTE — Therapy (Signed)
[place] MAIN [facility] SERVICES [address] [place], [place], [address] Phone: [phone]   Fax:  [phone]  Physical Therapy Treatment  Patient Details  Name: Stanley Farley MRN: [id number] Date of Birth: December 19, 1975 Referring Provider: Donnie Coffin    Encounter Date: 01/04/2018  PT End of Session - 01/04/18 1516    Visit Number  5    Number of Visits  25    Date for PT Re-Evaluation  02/17/18    PT Start Time  1425    PT Stop Time  1445    PT Time Calculation (min)  20 min    Activity Tolerance  Patient tolerated treatment well;Patient limited by pain    Behavior During Therapy  [facility] for tasks assessed/performed       Past Medical History:  Diagnosis Date  . Diabetes mellitus without complication ([place])   . Hypertension     Past Surgical History:  Procedure Laterality Date  . TEE WITHOUT CARDIOVERSION N/A 02/04/2017   Procedure: TRANSESOPHAGEAL ECHOCARDIOGRAM (TEE);  Surgeon: Minna Merritts, MD;  Location: [facility] ORS;  Service: Cardiovascular;  Laterality: N/A;    There were no vitals filed for this visit.  Subjective Assessment - 01/04/18 1515    Subjective  Patient reports compliance with HEP. He states still having discomfort in LUE shoulder but denies any pain; Reports still having difficulty sleeping, often trying to sleep on left side to reduce sublux.     Pertinent History  Patient is 42 years old and had a CVA 01/22/18 . He was at [facility] for 3 days and then [place] in 7 days. He had HHPT for  1 month following the hospital stay. Patient noticed that over time he has been able to do less and less and it is getting weaker. He is not able to move his arm wihtout pain.     Limitations  Lifting;House hold activities    Patient Stated Goals  Patient wants to use his left hand more and have less pain .    Currently in Pain?  Yes    Pain Score  2     Pain Location  Shoulder    Pain Orientation  Left    Pain Descriptors /  Indicators  Aching;Burning    Pain Type  Chronic pain    Pain Onset  More than a month ago    Pain Frequency  Intermittent    Aggravating Factors   movement, position at night    Pain Relieving Factors  rest    Effect of Pain on Daily Activities  decreased activity    Multiple Pain Sites  No         [facility] PT Assessment - 01/04/18 0001      AROM   Left Shoulder Internal Rotation  70 Degrees in 45 degrees abduction    Left Shoulder External Rotation  5 Degrees in 45 degrees abductoin          TREATMENT: Patient late to session.  Patient supine: PT performed grade II-III inferior, posterior and anterior shoulder joint mobs to LUE glenohumeral joint, 20 sec bouts x5 sets each; PT performed PROM of LUE shoulder flexion, IR/ER x10 reps each with overpressure  Contract-relax, 5 sec hold, relax into shoulder ER x5 reps with overpressure into ER to facilitate better stretch;  LUE PNF pattern D1 with red tband x10 reps with VCs to increase ROM to tolerance; Patient unable to achieve full ROM;   PT  re-assessed LUE shoulder ROM- see above;  Reinforced importance of HEP adherence to improve shoulder ROM; Patient verbalized understanding;                 PT Education - 01/04/18 1516    Education provided  Yes    Education Details  ROM/stretching    Person(s) Educated  Patient    Methods  Explanation;Demonstration;Verbal cues    Comprehension  Verbalized understanding;Returned demonstration;Verbal cues required;Need further instruction       PT Short Term Goals - 12/17/17 1627      PT SHORT TERM GOAL #1   Title   Patient will decrease Quick DASH score by > 8 points demonstrating reduced self-reported upper extremity disability.    Baseline  63.63 %, 12/17/17: 65.9%    Time  6    Period  Weeks    Status  Not Met    Target Date  01/06/18        PT Long Term Goals - 12/17/17 1636      PT LONG TERM GOAL #1   Title  Patient will be independent in home exercise  program to improve strength/mobility for better functional independence with ADLs.    Baseline  reports independence and compliance;     Time  12    Period  Weeks    Status  Achieved    Target Date  02/17/18      PT LONG TERM GOAL #2   Title  Patient will increase BUE gross strength to 4+/5 as to improve functional strength for independent gait, increased standing tolerance and increased ADL ability.    Baseline  -2/5 left shoulder, left elbow, left hand; 12/17/17: 2-/5 gross strength in LUE shoulder/elbow and hand;     Time  12    Period  Weeks    Status  New    Target Date  02/17/18      PT LONG TERM GOAL #3   Title  Patient will be able to perform household work/ chores without increase in symptoms.    Baseline  10/10 pain left shoulder    Time  12    Period  Weeks    Status  Not Met    Target Date  02/17/18      PT LONG TERM GOAL #4   Title  Patient will report a worst pain of 3/10 on VAS in left shoulder to improve tolerance with ADLs and reduced symptoms with activities.     Baseline  10/10 discomfort;     Time  12    Period  Weeks    Status  Not Met    Target Date  02/17/18            Plan - 01/04/18 1517    Clinical Impression Statement  patient late to session; He reports continued discomfort in LUE shoulder. PT performed joint mobs and PROM to LUE shoulder. He continues to have significant limitation in shoulder ER which limits functional movement. He reports adherence to HEP however no significant change in shoulder ER has been identified. He would benefit from additional skilled PT intervention to improve ROM/strength and reduce pain with ADLs;     Rehab Potential  Fair    PT Frequency  2x / week    PT Treatment/Interventions  Aquatic Therapy;Moist Heat;Ultrasound;Electrical Stimulation;Cryotherapy;ADLs/Self Care Home Management;Therapeutic activities;Therapeutic exercise;Patient/family education;Passive range of motion    PT Next Visit Plan  PROM, pulley, manual  therapy left shoulder    Consulted and  Agree with Plan of Care  Patient       Patient will benefit from skilled therapeutic intervention in order to improve the following deficits and impairments:  Decreased strength, Pain, Postural dysfunction, Impaired UE functional use, Impaired flexibility, Decreased coordination, Decreased activity tolerance, Decreased range of motion, Hypomobility  Visit Diagnosis: Chronic left shoulder pain  Muscle weakness (generalized)  Cerebral infarction due to thrombosis of precerebral artery [facility])     Problem List Patient Active Problem List   Diagnosis Date Noted  . Thrombotic cerebral infarction ([place]) 02/04/2017  . 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 Poorly controlled type 2 diabetes mellitus with peripheral neuropathy ([place])   . Benign essential HTN   . Tobacco abuse   . Marijuana abuse   . Hyperlipidemia   . H/O medication noncompliance   . Acute CVA (cerebrovascular accident) ([place]) 02/01/2017    Evangelia Whitaker PT, DPT 01/04/2018, 3:43 PM  [place] MAIN [facility] SERVICES [address] [place], [place], [address] Phone: [phone]   Fax:  [phone]  Name: Stanley Farley MRN: [id number] Date of Birth: 1976-05-08

## 2018-01-06 ENCOUNTER — Encounter: Payer: Self-pay | Admitting: Physical Therapy

## 2018-01-06 ENCOUNTER — Ambulatory Visit: Payer: Medicaid Other | Admitting: Physical Therapy

## 2018-01-06 DIAGNOSIS — G8929 Other chronic pain: Secondary | ICD-10-CM

## 2018-01-06 DIAGNOSIS — M25512 Pain in left shoulder: Secondary | ICD-10-CM | POA: Diagnosis not present

## 2018-01-06 DIAGNOSIS — M6281 Muscle weakness (generalized): Secondary | ICD-10-CM

## 2018-01-06 DIAGNOSIS — I6300 Cerebral infarction due to thrombosis of unspecified precerebral artery: Secondary | ICD-10-CM

## 2018-01-06 NOTE — Therapy (Signed)
[place] MAIN [facility] SERVICES [address] [place], [place], [address] Phone: [phone]   Fax:  [phone]  Physical Therapy Treatment  Patient Details  Name: Stanley Farley MRN: [id number] Date of Birth: [birth year] Referring Provider: Donnie Coffin    Encounter Date: 01/06/2018  PT End of Session - 01/06/18 1536    Visit Number  6    Number of Visits  25    Date for PT Re-Evaluation  02/17/18    PT Start Time  1448    PT Stop Time  1515    PT Time Calculation (min)  27 min    Activity Tolerance  Patient tolerated treatment well;Patient limited by pain    Behavior During Therapy  [facility] for tasks assessed/performed       Past Medical History:  Diagnosis Date  . Diabetes mellitus without complication ([place])   . Hypertension     Past Surgical History:  Procedure Laterality Date  . TEE WITHOUT CARDIOVERSION N/A 02/04/2017   Procedure: TRANSESOPHAGEAL ECHOCARDIOGRAM (TEE);  Surgeon: Minna Merritts, MD;  Location: [facility] ORS;  Service: Cardiovascular;  Laterality: N/A;    There were no vitals filed for this visit.  Subjective Assessment - 01/06/18 1535    Subjective  Patient states, "My wife came with me today so that she can help me at home with my stretches."     Pertinent History  Patient is 42 years old and had a CVA 01/22/18 . He was at [facility] for 3 days and then [place] in 7 days. He had HHPT for  1 month following the hospital stay. Patient noticed that over time he has been able to do less and less and it is getting weaker. He is not able to move his arm wihtout pain.     Limitations  Lifting;House hold activities    Patient Stated Goals  Patient wants to use his left hand more and have less pain .    Currently in Pain?  Yes    Pain Score  2     Pain Location  Shoulder    Pain Orientation  Left    Pain Descriptors / Indicators  Aching;Burning    Pain Type  Chronic pain    Pain Onset  More than a month ago    Pain Frequency   Intermittent    Aggravating Factors   movement, position at night    Pain Relieving Factors  rest    Effect of Pain on Daily Activities  decreased activity    Multiple Pain Sites  No         [facility] PT Assessment - 01/06/18 0001      AROM   Left Shoulder Flexion  55 Degrees    Left Shoulder Internal Rotation  70 Degrees    Left Shoulder External Rotation  8 Degrees          TREATMENT: Patient late to session.  Patient supine: PT performed grade II-III inferior, posterior and anterior shoulder joint mobs to LUE glenohumeral joint, 20 sec bouts x2 sets each; Educated wife/caregiver in proper positioning for better safety and joint mobility;  PT performed PROM of LUE shoulder flexion, IR/ER x5 reps each with overpressure; Educated caregiver in proper hand placement and how to perform PROM for better tolerance; Patient able to tolerate PROM fair but does have pain with end range; Contract-relax, 5 sec hold, relax into shoulder ER x5 reps with overpressure into ER to facilitate better stretch;  PT re-assessed LUE shoulder ROM- see above;  Reinforced importance of HEP adherence to improve shoulder ROM; Patient verbalized understanding; Provided written handout for better understanding;                    PT Education - 01/06/18 1536    Education provided  Yes    Education Details  ROM/stretching; HEP    Person(s) Educated  Patient    Methods  Explanation;Demonstration;Verbal cues    Comprehension  Verbalized understanding;Returned demonstration;Verbal cues required;Need further instruction       PT Short Term Goals - 12/17/17 1627      PT SHORT TERM GOAL #1   Title   Patient will decrease Quick DASH score by > 8 points demonstrating reduced self-reported upper extremity disability.    Baseline  63.63 %, 12/17/17: 65.9%    Time  6    Period  Weeks    Status  Not Met    Target Date  01/06/18        PT Long Term Goals - 12/17/17 1636      PT LONG TERM  GOAL #1   Title  Patient will be independent in home exercise program to improve strength/mobility for better functional independence with ADLs.    Baseline  reports independence and compliance;     Time  12    Period  Weeks    Status  Achieved    Target Date  02/17/18      PT LONG TERM GOAL #2   Title  Patient will increase BUE gross strength to 4+/5 as to improve functional strength for independent gait, increased standing tolerance and increased ADL ability.    Baseline  -2/5 left shoulder, left elbow, left hand; 12/17/17: 2-/5 gross strength in LUE shoulder/elbow and hand;     Time  12    Period  Weeks    Status  New    Target Date  02/17/18      PT LONG TERM GOAL #3   Title  Patient will be able to perform household work/ chores without increase in symptoms.    Baseline  10/10 pain left shoulder    Time  12    Period  Weeks    Status  Not Met    Target Date  02/17/18      PT LONG TERM GOAL #4   Title  Patient will report a worst pain of 3/10 on VAS in left shoulder to improve tolerance with ADLs and reduced symptoms with activities.     Baseline  10/10 discomfort;     Time  12    Period  Weeks    Status  Not Met    Target Date  02/17/18            Plan - 01/06/18 1536    Clinical Impression Statement  Patient late to session; He presents to therapy with his wife. Provided patient and caregiver education on passive ROM to patient LUE shoulder to improve mobility. Patient and caregiver required cues for hand placement and positioning for optimal stretch. Patient continues to have limitations in shoulder ER, flexion and abduction. He would benefit from additional skilled PT intervention to improve shoulder ROM and reduce pain.     Rehab Potential  Fair    PT Frequency  2x / week    PT Treatment/Interventions  Aquatic Therapy;Moist Heat;Ultrasound;Electrical Stimulation;Cryotherapy;ADLs/Self Care Home Management;Therapeutic activities;Therapeutic exercise;Patient/family  education;Passive range of motion    PT Next Visit Plan  PROM, pulley, manual therapy left shoulder    Consulted and Agree with Plan of Care  Patient       Patient will benefit from skilled therapeutic intervention in order to improve the following deficits and impairments:  Decreased strength, Pain, Postural dysfunction, Impaired UE functional use, Impaired flexibility, Decreased coordination, Decreased activity tolerance, Decreased range of motion, Hypomobility  Visit Diagnosis: Chronic left shoulder pain  Muscle weakness (generalized)  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 Thrombotic cerebral infarction ([place]) 02/04/2017  . 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 Poorly controlled type 2 diabetes mellitus with peripheral neuropathy ([place])   . Benign essential HTN   . Tobacco abuse   . Marijuana abuse   . Hyperlipidemia   . H/O medication noncompliance   . Acute CVA (cerebrovascular accident) ([place]) 02/01/2017    Trotter,Margaret PT, DPT 01/06/2018, 3:52 PM  [place] MAIN [facility] SERVICES [address] [place], [place], [address] Phone: [phone]   Fax:  [phone]  Name: Stanley Farley MRN: [id number] Date of Birth: 09/07/1975

## 2018-01-11 ENCOUNTER — Encounter: Payer: Self-pay | Admitting: Physical Therapy

## 2018-01-11 ENCOUNTER — Ambulatory Visit: Payer: Medicaid Other | Admitting: Physical Therapy

## 2018-01-11 DIAGNOSIS — G8929 Other chronic pain: Secondary | ICD-10-CM

## 2018-01-11 DIAGNOSIS — M6281 Muscle weakness (generalized): Secondary | ICD-10-CM

## 2018-01-11 DIAGNOSIS — M25512 Pain in left shoulder: Secondary | ICD-10-CM | POA: Diagnosis not present

## 2018-01-11 DIAGNOSIS — I6300 Cerebral infarction due to thrombosis of unspecified precerebral artery: Secondary | ICD-10-CM

## 2018-01-11 NOTE — Patient Instructions (Signed)
Access your exercises! [email or web address] Your Access Code: [email or web address]  Shoulder prone exercise: Shoulder extension, low row, mid row, flexion x10 reps; Sidelying shoulder ER All x10 reps

## 2018-01-11 NOTE — Therapy (Signed)
[place] MAIN [facility] SERVICES [address] [place], [place], [address] Phone: [phone]   Fax:  [phone]  Physical Therapy Treatment  Patient Details  Name: Stanley Farley MRN: [id number] Date of Birth: 05/23/1976 Referring Provider: Donnie Coffin    Encounter Date: 01/11/2018  PT End of Session - 01/11/18 1419    Visit Number  7    Number of Visits  25    Date for PT Re-Evaluation  02/17/18    Authorization Type  medicaid authorization    Authorization Time Period  6/26-8/6    Authorization - Visit Number  3    Authorization - Number of Visits  12    PT Start Time  1430    PT Stop Time  1515    PT Time Calculation (min)  45 min    Activity Tolerance  Patient tolerated treatment well;Patient limited by pain    Behavior During Therapy  [facility] for tasks assessed/performed       Past Medical History:  Diagnosis Date  . Diabetes mellitus without complication ([place])   . Hypertension     Past Surgical History:  Procedure Laterality Date  . TEE WITHOUT CARDIOVERSION N/A 02/04/2017   Procedure: TRANSESOPHAGEAL ECHOCARDIOGRAM (TEE);  Surgeon: Minna Merritts, MD;  Location: [facility] ORS;  Service: Cardiovascular;  Laterality: N/A;    There were no vitals filed for this visit.  Subjective Assessment - 01/11/18 1605    Subjective  Patient reports adherence to HEP. He states, "My wife and sons have been helping me stretch at home." Denies any pain in shoulder currently.     Pertinent History  Patient is 42 years old and had a CVA 01/22/18 . He was at [facility] for 3 days and then [place] in 7 days. He had HHPT for  1 month following the hospital stay. Patient noticed that over time he has been able to do less and less and it is getting weaker. He is not able to move his arm wihtout pain.     Limitations  Lifting;House hold activities    Patient Stated Goals  Patient wants to use his left hand more and have less pain .    Currently in Pain?   No/denies    Pain Onset  More than a month ago    Multiple Pain Sites  No       TREATMENT:  Patient supine: PT performed grade II-III inferior, posterior and anterior shoulder joint mobs to LUE glenohumeral joint, 20 sec bouts x5 sets each; PT performed PROM of LUE shoulder flexion, IR/ER, abduction x10 reps each with overpressure  Contract-relax, 5 sec hold, relax into shoulder ER x5 reps with overpressure into ER to facilitate better stretch;  LUE PNF pattern D1/D2 with red tband x10 reps each with VCs to increase ROM to tolerance and for correct exercise technique; Patient unable to achieve full ROM;   Patient prone: LUE shoulder strengthening with 1#: Shoulder extension x10 Shoulder low row x10 Shoulder mid row x10 Shoulder flexion x10; Patient required mod VCs and tactile cues for correct exercise technique and to improve functional ROM;  Sidelying: LUE shoulder ER 2x10 with 1# arm weight with mod Vcs for positioning to isolate shoulder ER and avoid trunk movement for less compensation;   PT re-assessed LUE shoulder ROM- see below;  Advanced HEP- see patient instructions;     [facility] PT Assessment - 01/11/18 0001      AROM   Left Shoulder  Flexion  75 Degrees    Left Shoulder ABduction  65 Degrees    Left Shoulder Internal Rotation  70 Degrees    Left Shoulder External Rotation  10 Degrees                           PT Education - 01/11/18 1419    Education provided  Yes    Education Details  ROM/strengthening, stretching; HEP    Person(s) Educated  Patient    Methods  Explanation;Demonstration;Verbal cues    Comprehension  Verbalized understanding;Returned demonstration;Verbal cues required;Need further instruction       PT Short Term Goals - 12/17/17 1627      PT SHORT TERM GOAL #1   Title   Patient will decrease Quick DASH score by > 8 points demonstrating reduced self-reported upper extremity disability.    Baseline  63.63 %,  12/17/17: 65.9%    Time  6    Period  Weeks    Status  Not Met    Target Date  01/06/18        PT Long Term Goals - 12/17/17 1636      PT LONG TERM GOAL #1   Title  Patient will be independent in home exercise program to improve strength/mobility for better functional independence with ADLs.    Baseline  reports independence and compliance;     Time  12    Period  Weeks    Status  Achieved    Target Date  02/17/18      PT LONG TERM GOAL #2   Title  Patient will increase BUE gross strength to 4+/5 as to improve functional strength for independent gait, increased standing tolerance and increased ADL ability.    Baseline  -2/5 left shoulder, left elbow, left hand; 12/17/17: 2-/5 gross strength in LUE shoulder/elbow and hand;     Time  12    Period  Weeks    Status  New    Target Date  02/17/18      PT LONG TERM GOAL #3   Title  Patient will be able to perform household work/ chores without increase in symptoms.    Baseline  10/10 pain left shoulder    Time  12    Period  Weeks    Status  Not Met    Target Date  02/17/18      PT LONG TERM GOAL #4   Title  Patient will report a worst pain of 3/10 on VAS in left shoulder to improve tolerance with ADLs and reduced symptoms with activities.     Baseline  10/10 discomfort;     Time  12    Period  Weeks    Status  Not Met    Target Date  02/17/18            Plan - 01/11/18 1603    Clinical Impression Statement  Patient reports adherence to HEP. Patient tolerated PROM and joint mobilizations well being able to exhibit slight improvement in LUE shoulder ROM. Instructed pateint in advanced UE strengthening to facilitate better shoulder control. He does require min-mod VCs and tactile cues for correct exercise technique. Patient would benefit from additional skilled PT Intervention to improve strength, ROM and reduce shoulder pain;     Rehab Potential  Fair    PT Frequency  2x / week    PT Treatment/Interventions  Aquatic  Therapy;Moist Heat;Ultrasound;Electrical Stimulation;Cryotherapy;ADLs/Self Care Home Management;Therapeutic activities;Therapeutic exercise;Patient/family  education;Passive range of motion    PT Next Visit Plan  PROM, pulley, manual therapy left shoulder    Consulted and Agree with Plan of Care  Patient       Patient will benefit from skilled therapeutic intervention in order to improve the following deficits and impairments:  Decreased strength, Pain, Postural dysfunction, Impaired UE functional use, Impaired flexibility, Decreased coordination, Decreased activity tolerance, Decreased range of motion, Hypomobility  Visit Diagnosis: Chronic left shoulder pain  Muscle weakness (generalized)  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 Thrombotic cerebral infarction ([place]) 02/04/2017  . 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 Poorly controlled type 2 diabetes mellitus with peripheral neuropathy ([place])   . Benign essential HTN   . Tobacco abuse   . Marijuana abuse   . Hyperlipidemia   . H/O medication noncompliance   . Acute CVA (cerebrovascular accident) ([place]) 02/01/2017    Taya Ashbaugh PT, DPT 01/11/2018, 4:06 PM  [place] MAIN [facility] SERVICES [address] [place], [place], [address] Phone: [phone]   Fax:  [phone]  Name: KALUB MORILLO MRN: [id number] Date of Birth: 1975-09-15

## 2018-01-14 ENCOUNTER — Ambulatory Visit: Payer: Medicaid Other

## 2018-01-14 DIAGNOSIS — M25512 Pain in left shoulder: Principal | ICD-10-CM

## 2018-01-14 DIAGNOSIS — G8929 Other chronic pain: Secondary | ICD-10-CM

## 2018-01-14 DIAGNOSIS — M6281 Muscle weakness (generalized): Secondary | ICD-10-CM

## 2018-01-14 NOTE — Patient Instructions (Signed)
Adhesive Capsulitis Adhesive capsulitis is inflammation of the tendons and ligaments that surround the shoulder joint (shoulder capsule). This condition causes the shoulder to become stiff and painful to move. Adhesive capsulitis is also called frozen shoulder. What are the causes? This condition may be caused by:  An injury to the shoulder joint.  Straining the shoulder.  Not moving the shoulder for a period of time. This can happen if your arm was injured or in a sling.  Long-standing health problems, such as: ? Diabetes. ? Thyroid problems. ? Heart disease. ? Stroke. ? Rheumatoid arthritis. ? Lung disease.  In some cases, the cause may not be known. What increases the risk? This condition is more likely to develop in:  Women.  People who are older than 42 years of age.  What are the signs or symptoms? Symptoms of this condition include:  Pain in the shoulder when moving the arm. There may also be pain when parts of the shoulder are touched. The pain is worse at night or when at rest.  Soreness or aching in the shoulder.  Inability to move the shoulder normally.  Muscle spasms.  How is this diagnosed? This condition is diagnosed with a physical exam and imaging tests, such as an X-ray or MRI. How is this treated? This condition may be treated with:  Treatment of the underlying cause or condition.  Physical therapy. This involves performing exercises to get the shoulder moving again.  Medicine. Medicine may be given to relieve pain, inflammation, or muscle spasms.  Steroid injections into the shoulder joint.  Shoulder manipulation. This is a procedure to move the shoulder into another position. It is done after you are given a medicine to make you fall asleep (general anesthetic). The joint may also be injected with salt water at high pressure to break down scarring.  Surgery. This may be done in severe cases when other treatments have failed.  Although most  people recover completely from adhesive capsulitis, some may not regain the full movement of the shoulder. Follow these instructions at home:  Take over-the-counter and prescription medicines only as told by your health care provider.  If you are being treated with physical therapy, follow instructions from your physical therapist.  Avoid exercises that put a lot of demand on your shoulder, such as throwing. These exercises can make pain worse.  If directed, apply ice to the injured area: ? Put ice in a plastic bag. ? Place a towel between your skin and the bag. ? Leave the ice on for 20 minutes, 2-3 times per day. Contact a health care provider if:  You develop new symptoms.  Your symptoms get worse. This information is not intended to replace advice given to you by your health care provider. Make sure you discuss any questions you have with your health care provider. Document Released: 04/20/2009 Document Revised: 11/29/2015 Document Reviewed: 10/16/2014 Elsevier Interactive Patient Education  2018 [employer]  

## 2018-01-14 NOTE — Therapy (Signed)
[place] MAIN [facility] SERVICES [address] [place], [place], [address] Phone: [phone]   Fax:  [phone]  Physical Therapy Treatment  Patient Details  Name: Stanley Farley MRN: [id number] Date of Birth: [birth year] Referring Provider: Donnie Coffin    Encounter Date: 01/14/2018  PT End of Session - 01/14/18 1535    Visit Number  8    Number of Visits  25    Date for PT Re-Evaluation  02/17/18    Authorization Type  medicaid authorization    Authorization Time Period  6/26-8/6    Authorization - Visit Number  4    Authorization - Number of Visits  12    PT Start Time  1520    PT Stop Time  1600    PT Time Calculation (min)  40 min    Activity Tolerance  Patient tolerated treatment well;Patient limited by pain    Behavior During Therapy  [facility] for tasks assessed/performed       Past Medical History:  Diagnosis Date  . Diabetes mellitus without complication ([place])   . Hypertension     Past Surgical History:  Procedure Laterality Date  . TEE WITHOUT CARDIOVERSION N/A 02/04/2017   Procedure: TRANSESOPHAGEAL ECHOCARDIOGRAM (TEE);  Surgeon: Minna Merritts, MD;  Location: [facility] ORS;  Service: Cardiovascular;  Laterality: N/A;    There were no vitals filed for this visit.  Subjective Assessment - 01/14/18 1534    Subjective  Patient reports adherence to HEP inconsistently. Denies any pain in shoulder currently or since his stroke.    Pertinent History  Patient is 42 years old and had a CVA 01/22/18 . He was at [facility] for 3 days and then [place] in 7 days. He had HHPT for  1 month following the hospital stay. Patient noticed that over time he has been able to do less and less and it is getting weaker. He is not able to move his arm wihtout pain.     Limitations  Lifting;House hold activities    Patient Stated Goals  Patient wants to use his left hand more and have less pain .    Currently in Pain?  No/denies            TREATMENT:  Patient supine: PT performed grade II-III inferior, posterior and inferior shoulder joint mobs to LUE glenohumeral joint,20sec bouts x5sets each; PT performed PROM of LUE shoulder flexion, IR/ER, abduction x10 reps each with overpressure L shoulder cross-body stretch 30s hold x 2; LUE PNF pattern D1/D2 with red tband x10 reps each with VCs to increase ROM to tolerance and for correct exercise technique; Patient unable to achieve full ROM;   Patient prone: LUE shoulder strengthening with 1#: Shoulder extension x10 Shoulder low row x10 Shoulder mid row x10 Shoulder flexion x10 unweighted; Patient required mod VCs and tactile cues for correct exercise technique and to improve functional ROM;  Sidelying: LUE shoulder ER x10 with 1# arm weight with mod Vcs for positioning to isolate shoulder ER and avoid trunk movement for less compensation;                          PT Education - 01/14/18 1535    Education provided  Yes    Education Details  ROM/strengthening, stretching    Person(s) Educated  Patient    Methods  Explanation    Comprehension  Verbalized understanding       PT Short  Term Goals - 12/17/17 1627      PT SHORT TERM GOAL #1   Title   Patient will decrease Quick DASH score by > 8 points demonstrating reduced self-reported upper extremity disability.    Baseline  63.63 %, 12/17/17: 65.9%    Time  6    Period  Weeks    Status  Not Met    Target Date  01/06/18        PT Long Term Goals - 12/17/17 1636      PT LONG TERM GOAL #1   Title  Patient will be independent in home exercise program to improve strength/mobility for better functional independence with ADLs.    Baseline  reports independence and compliance;     Time  12    Period  Weeks    Status  Achieved    Target Date  02/17/18      PT LONG TERM GOAL #2   Title  Patient will increase BUE gross strength to 4+/5 as to improve functional strength for independent  gait, increased standing tolerance and increased ADL ability.    Baseline  -2/5 left shoulder, left elbow, left hand; 12/17/17: 2-/5 gross strength in LUE shoulder/elbow and hand;     Time  12    Period  Weeks    Status  New    Target Date  02/17/18      PT LONG TERM GOAL #3   Title  Patient will be able to perform household work/ chores without increase in symptoms.    Baseline  10/10 pain left shoulder    Time  12    Period  Weeks    Status  Not Met    Target Date  02/17/18      PT LONG TERM GOAL #4   Title  Patient will report a worst pain of 3/10 on VAS in left shoulder to improve tolerance with ADLs and reduced symptoms with activities.     Baseline  10/10 discomfort;     Time  12    Period  Weeks    Status  Not Met    Target Date  02/17/18            Plan - 01/14/18 1535    Clinical Impression Statement  Patient reports adherence to HEP. Patient tolerated PROM and joint mobilizations but reports increase in shoulder pain with cross body stretch. He has significant restriction in his L shoulder range of motion especially in external rotation. Pt appears to have a possible L shoulder adhesive capsulitis. Pt encouraged to continue HEP especially stretching. He will benefit from additional skilled PT Intervention to improve strength, ROM and reduce shoulder pain.    Rehab Potential  Fair    PT Frequency  2x / week    PT Treatment/Interventions  Aquatic Therapy;Moist Heat;Ultrasound;Electrical Stimulation;Cryotherapy;ADLs/Self Care Home Management;Therapeutic activities;Therapeutic exercise;Patient/family education;Passive range of motion    PT Next Visit Plan  PROM, pulley, manual therapy left shoulder    Consulted and Agree with Plan of Care  Patient       Patient will benefit from skilled therapeutic intervention in order to improve the following deficits and impairments:  Decreased strength, Pain, Postural dysfunction, Impaired UE functional use, Impaired flexibility,  Decreased coordination, Decreased activity tolerance, Decreased range of motion, Hypomobility  Visit Diagnosis: Chronic left shoulder pain  Muscle weakness (generalized)     Problem List Patient Active Problem List   Diagnosis Date Noted  . 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 Poorly controlled type 2 diabetes mellitus with peripheral neuropathy ([place])   . Benign essential HTN   . Tobacco abuse   . Marijuana abuse   . Hyperlipidemia   . H/O medication noncompliance   . Acute CVA (cerebrovascular accident) ([place]) 02/01/2017   Phillips Grout PT, DPT, GCS  Huprich,Jason 01/15/2018, 12:58 PM  [place] MAIN [facility] SERVICES [address] [place], [place], [address] Phone: [phone]   Fax:  [phone]  Name: Stanley Farley MRN: [id number] Date of Birth: 1976/05/04

## 2018-01-18 ENCOUNTER — Ambulatory Visit: Payer: Medicaid Other | Admitting: Physical Therapy

## 2018-01-18 ENCOUNTER — Encounter: Payer: Self-pay | Admitting: Physical Therapy

## 2018-01-18 DIAGNOSIS — G8929 Other chronic pain: Secondary | ICD-10-CM

## 2018-01-18 DIAGNOSIS — I6300 Cerebral infarction due to thrombosis of unspecified precerebral artery: Secondary | ICD-10-CM

## 2018-01-18 DIAGNOSIS — M6281 Muscle weakness (generalized): Secondary | ICD-10-CM

## 2018-01-18 DIAGNOSIS — M25512 Pain in left shoulder: Secondary | ICD-10-CM | POA: Diagnosis not present

## 2018-01-18 NOTE — Therapy (Signed)
[place] MAIN [facility] SERVICES [address] [place], [place], [address] Phone: [phone]   Fax:  [phone]  Physical Therapy Treatment  Patient Details  Name: Stanley Farley MRN: [id number] Date of Birth: 05-02-1976 Referring Provider: Donnie Coffin    Encounter Date: 01/18/2018  PT End of Session - 01/18/18 1622    Visit Number  9    Number of Visits  25    Date for PT Re-Evaluation  02/17/18    Authorization Type  medicaid authorization    Authorization Time Period  6/26-8/6    Authorization - Visit Number  5    Authorization - Number of Visits  12    PT Start Time  1525    PT Stop Time  1600    PT Time Calculation (min)  35 min    Activity Tolerance  Patient tolerated treatment well;Patient limited by pain    Behavior During Therapy  [facility] for tasks assessed/performed       Past Medical History:  Diagnosis Date  . Diabetes mellitus without complication ([place])   . Hypertension     Past Surgical History:  Procedure Laterality Date  . TEE WITHOUT CARDIOVERSION N/A 02/04/2017   Procedure: TRANSESOPHAGEAL ECHOCARDIOGRAM (TEE);  Surgeon: Minna Merritts, MD;  Location: [facility] ORS;  Service: Cardiovascular;  Laterality: N/A;    There were no vitals filed for this visit.  Subjective Assessment - 01/18/18 1532    Subjective  Patient reports adherence to HEP inconsistently. Denies any pain in shoulder currently or since his stroke.    Pertinent History  Patient is 42 years old and had a CVA 01/22/18 . He was at [facility] for 3 days and then [place] in 7 days. He had HHPT for  1 month following the hospital stay. Patient noticed that over time he has been able to do less and less and it is getting weaker. He is not able to move his arm wihtout pain.     Limitations  Lifting;House hold activities    Patient Stated Goals  Patient wants to use his left hand more and have less pain .    Currently in Pain?  No/denies    Multiple Pain Sites  No          [facility] PT Assessment - 01/18/18 0001      AROM   Left Shoulder Flexion  75 Degrees    Left Shoulder ABduction  55 Degrees    Left Shoulder Internal Rotation  65 Degrees in 45 degrees abduction    Left Shoulder External Rotation  10 Degrees in 45 degrees abduction         TREATMENT:  Patient supine: PT performed grade II-III inferior, posterior and anterior shoulder joint mobs to LUE glenohumeral joint,20sec bouts x5sets each; PT performed PROM of LUE shoulder flexion, IR/ER, abduction x10 reps each with overpressure Patient continues to be limited in shoulder flexion, abduction and ER.  Patient prone: LUE shoulder strengthening with 1#: Shoulder extension x12 Shoulder low row x12 Shoulder mid row x12 Shoulder flexion x12; Patient required mod VCs and tactile cues for correct exercise technique and to improve functional ROM;  Sidelying: LUE shoulder ER 2x12 with 1# arm weight with mod Vcs for positioning to isolate shoulder ER and avoid trunk movement for less compensation;   PT re-assessed LUE shoulder ROM- see above;  PT Education - 01/18/18 1612    Education provided  Yes    Education Details  ROM, stretches, strengthening    Person(s) Educated  Patient    Methods  Explanation;Verbal cues    Comprehension  Verbalized understanding;Returned demonstration;Need further instruction       PT Short Term Goals - 12/17/17 1627      PT SHORT TERM GOAL #1   Title   Patient will decrease Quick DASH score by > 8 points demonstrating reduced self-reported upper extremity disability.    Baseline  63.63 %, 12/17/17: 65.9%    Time  6    Period  Weeks    Status  Not Met    Target Date  01/06/18        PT Long Term Goals - 12/17/17 1636      PT LONG TERM GOAL #1   Title  Patient will be independent in home exercise program to improve strength/mobility for better functional independence with ADLs.    Baseline  reports  independence and compliance;     Time  12    Period  Weeks    Status  Achieved    Target Date  02/17/18      PT LONG TERM GOAL #2   Title  Patient will increase BUE gross strength to 4+/5 as to improve functional strength for independent gait, increased standing tolerance and increased ADL ability.    Baseline  -2/5 left shoulder, left elbow, left hand; 12/17/17: 2-/5 gross strength in LUE shoulder/elbow and hand;     Time  12    Period  Weeks    Status  New    Target Date  02/17/18      PT LONG TERM GOAL #3   Title  Patient will be able to perform household work/ chores without increase in symptoms.    Baseline  10/10 pain left shoulder    Time  12    Period  Weeks    Status  Not Met    Target Date  02/17/18      PT LONG TERM GOAL #4   Title  Patient will report a worst pain of 3/10 on VAS in left shoulder to improve tolerance with ADLs and reduced symptoms with activities.     Baseline  10/10 discomfort;     Time  12    Period  Weeks    Status  Not Met    Target Date  02/17/18            Plan - 01/18/18 1624    Clinical Impression Statement  Patient reports adherence to HEP however exhibits no change in PROM. Patient re-educated on importance of HEP adherence including increasing repetition for better ROM. Patient continues to have pain especially with shoulder flexion,abduction and ER. He fatigues quickly with scapular strengthening due to weakness. Patient would benefit from additional skilled PT intervention to improve ROM, strength for better ADL ability.     Rehab Potential  Fair    PT Frequency  2x / week    PT Treatment/Interventions  Aquatic Therapy;Moist Heat;Ultrasound;Electrical Stimulation;Cryotherapy;ADLs/Self Care Home Management;Therapeutic activities;Therapeutic exercise;Patient/family education;Passive range of motion    PT Next Visit Plan  PROM, pulley, manual therapy left shoulder    Consulted and Agree with Plan of Care  Patient       Patient will  benefit from skilled therapeutic intervention in order to improve the following deficits and impairments:  Decreased strength, Pain, Postural dysfunction, Impaired UE functional use,  Impaired flexibility, Decreased coordination, Decreased activity tolerance, Decreased range of motion, Hypomobility  Visit Diagnosis: Chronic left shoulder pain  Muscle weakness (generalized)  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 Poorly controlled type 2 diabetes mellitus with peripheral neuropathy ([place])   . Benign essential HTN   . Tobacco abuse   . Marijuana abuse   . Hyperlipidemia   . H/O medication noncompliance   . Acute CVA (cerebrovascular accident) ([place]) 02/01/2017    Renaldo Gornick PT, DPT 01/18/2018, 4:27 PM  [place] MAIN [facility] SERVICES [address] [place], [place], [address] Phone: [phone]   Fax:  [phone]  Name: Stanley Farley MRN: [id number] Date of Birth: 1975/08/20

## 2018-01-21 ENCOUNTER — Ambulatory Visit: Payer: Medicaid Other | Admitting: Physical Therapy

## 2018-01-25 ENCOUNTER — Ambulatory Visit: Payer: Medicaid Other | Admitting: Physical Therapy

## 2018-01-27 ENCOUNTER — Ambulatory Visit: Payer: Medicaid Other | Admitting: Physical Therapy

## 2018-01-27 ENCOUNTER — Encounter: Payer: Self-pay | Admitting: Physical Therapy

## 2018-01-27 DIAGNOSIS — M6281 Muscle weakness (generalized): Secondary | ICD-10-CM

## 2018-01-27 DIAGNOSIS — M25512 Pain in left shoulder: Principal | ICD-10-CM

## 2018-01-27 DIAGNOSIS — G8929 Other chronic pain: Secondary | ICD-10-CM

## 2018-01-27 NOTE — Therapy (Addendum)
[place] MAIN [facility] SERVICES [address] [place], [place], [address] Phone: [phone]   Fax:  [phone]  Physical Therapy Treatment Physical Therapy Progress Note   Dates of reporting period  12/17/17   to   01/27/18   Patient Details  Name: Stanley Farley MRN: [id number] Date of Birth: [birth year] Referring Provider: Donnie Coffin    Encounter Date: 01/27/2018  PT End of Session - 01/27/18 1628    Visit Number  10    Number of Visits  25    Date for PT Re-Evaluation  02/17/18    Authorization Type  medicaid authorization    Authorization Time Period  6/26-8/6    Authorization - Visit Number  6    Authorization - Number of Visits  12    PT Start Time  1603    PT Stop Time  1645    PT Time Calculation (min)  42 min    Activity Tolerance  Patient limited by pain    Behavior During Therapy  [facility] for tasks assessed/performed       Past Medical History:  Diagnosis Date  . Diabetes mellitus without complication ([place])   . Hypertension     Past Surgical History:  Procedure Laterality Date  . TEE WITHOUT CARDIOVERSION N/A 02/04/2017   Procedure: TRANSESOPHAGEAL ECHOCARDIOGRAM (TEE);  Surgeon: Minna Merritts, MD;  Location: [facility] ORS;  Service: Cardiovascular;  Laterality: N/A;    There were no vitals filed for this visit.  Subjective Assessment - 01/27/18 1627    Subjective  Patient reports not stretching prior to session. He reports inconsistency with HEP; Reports feeling depressed over not getting disability; states, "I dont'  know how I'm supposed to provide for my family."     Pertinent History  Patient is 42 years old and had a CVA 01/22/18 . He was at [facility] for 3 days and then [place] in 7 days. He had HHPT for  1 month following the hospital stay. Patient noticed that over time he has been able to do less and less and it is getting weaker. He is not able to move his arm wihtout pain.     Limitations  Lifting;House hold  activities    Patient Stated Goals  Patient wants to use his left hand more and have less pain .    Currently in Pain?  No/denies    Multiple Pain Sites  No           [facility] PT Assessment - 01/27/18 1605     AROM   Left Shoulder Flexion  80 Degrees in supine    Left Shoulder Internal Rotation  50 Degrees supine with 30 degrees abduction    Left Shoulder External Rotation  -2 Degrees supine with 30 degrees abduction           TREATMENT: Assessed LUE shoulder ROM at start of session-see above  Patient supine: PT performed grade III-IV inferior, posterior and anterior shoulder joint mobs to LUE glenohumeral joint,20sec bouts x5sets each; PT performed PROM of LUE shoulder flexion, IR/ER, abductionx10 reps each with overpressure Patient continues to be limited in shoulder flexion, abduction and ER.  Patient prone: LUE shoulder strengthening with 1#: Shoulder extension 2x10 Shoulder low row 2x10 Shoulder mid row 2x10 Shoulder flexion 2x10 Patient required mod VCs and tactile cues for correct exercise technique and to improve functional ROM;  Sidelying: LUE shoulder ER x12 with 1# arm weight with mod Vcs for positioning to  isolate shoulder ER and avoid trunk movement for less compensation;  PT re-assessed LUE shoulder ROMMichell Heinrich;  [facility] PT Assessment - 01/27/18 1641      Observation/Other Assessments   Quick DASH   59.1% the lower the score the better mobility; slightly improved from initial eval on 11/25/17 which was 63%      AROM   Left Shoulder Flexion  90 Degrees in supine; sitting: 82 degrees    Left Shoulder ABduction  85 Degrees sitting with shoulder IR    Left Shoulder Internal Rotation  70 Degrees in supine with 30 degrees abduction    Left Shoulder External Rotation  10 Degrees supine with 30 degrees abduction                          PT Education - 01/27/18 1628    Education provided  Yes    Education Details  ROM,  stretches, strengthening;     Person(s) Educated  Patient    Methods  Explanation;Verbal cues    Comprehension  Verbalized understanding;Returned demonstration;Verbal cues required;Need further instruction       PT Short Term Goals - 01/27/18 1707      PT SHORT TERM GOAL #1   Title   Patient will decrease Quick DASH score by > 8 points demonstrating reduced self-reported upper extremity disability.    Baseline  63.63 %, 12/17/17: 65.9%; 01/27/18: 59.1%    Time  6    Period  Weeks    Status  Partially Met    Target Date  01/06/18        PT Long Term Goals - 01/27/18 1630      PT LONG TERM GOAL #1   Title  Patient will be independent in home exercise program to improve strength/mobility for better functional independence with ADLs.    Baseline  not adherent;     Time  12    Period  Weeks    Status  Not Met    Target Date  02/17/18      PT LONG TERM GOAL #2   Title  Patient will increase BUE gross strength to 4+/5 as to improve functional strength for independent gait, increased standing tolerance and increased ADL ability.    Baseline  -2/5 left shoulder, left elbow, left hand; 12/17/17: 2-/5 gross strength in LUE shoulder/elbow and hand; 01/27/18: 2-/5 in shoulder, elbow: 3-/5    Time  12    Period  Weeks    Status  Not Met    Target Date  02/17/18      PT LONG TERM GOAL #3   Title  Patient will be able to perform household work/ chores without increase in symptoms.    Baseline  10/10 pain left shoulder 01/27/18: 0/10 pain, reports being mod I for self care ADLs;     Time  12    Period  Weeks    Status  Achieved    Target Date  02/17/18      PT LONG TERM GOAL #4   Title  Patient will report a worst pain of 3/10 on VAS in left shoulder to improve tolerance with ADLs and reduced symptoms with activities.     Baseline   3/10 worse pain     Time  12    Period  Weeks    Status  Achieved    Target Date  02/17/18            Plan -  01/27/18 1629    Clinical Impression  Statement  Patient reports mixed adherence to HEP; He reports, "I have been so depressed that I haven't felt like doing them." PT performed PROM and joint mobilizations to facilitate better shoulder ROM. He does continues to have pain and decreased ROM in all directions but is most limited with shoulder ER;  Patient and caregiver have been given specific instructions on how to imrove ROM with stretches and positioning. However despite repeated instruction patient is not adherent. Patient exhibits improvement in ROM within session but no significant gains between sessions. This is likely due to lack of adherence. Will continue to address ROM deficits and strengthening over next few sessions to make the most gains.  Patient's condition has the potential to improve in response to therapy. Maximum improvement is yet to be obtained. The anticipated improvement is attainable and reasonable in a generally predictable time. Start date of reporting period 12/17/17 end date of reporting period 01/27/18. Patient reports being able to do a little more, but is still having stiffness with all mobility;     Rehab Potential  Fair    PT Frequency  2x / week    PT Treatment/Interventions  Aquatic Therapy;Moist Heat;Ultrasound;Electrical Stimulation;Cryotherapy;ADLs/Self Care Home Management;Therapeutic activities;Therapeutic exercise;Patient/family education;Passive range of motion    PT Next Visit Plan  PROM, pulley, manual therapy left shoulder    Consulted and Agree with Plan of Care  Patient       Patient will benefit from skilled therapeutic intervention in order to improve the following deficits and impairments:  Decreased strength, Pain, Postural dysfunction, Impaired UE functional use, Impaired flexibility, Decreased coordination, Decreased activity tolerance, Decreased range of motion, Hypomobility  Visit Diagnosis: Chronic left shoulder pain  Muscle weakness (generalized)     Problem List Patient Active  Problem List   Diagnosis Date Noted  . 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 Poorly controlled type 2 diabetes mellitus with peripheral neuropathy ([place])   . Benign essential HTN   . Tobacco abuse   . Marijuana abuse   . Hyperlipidemia   . H/O medication noncompliance   . Acute CVA (cerebrovascular accident) ([place]) 02/01/2017    Trotter,Margaret PT, DPT 01/27/2018, 5:08 PM  [place] MAIN [facility] SERVICES [address] [place], [place], [address] Phone: [phone]   Fax:  [phone]  Name: Stanley Farley MRN: [id number] Date of Birth: 1975/10/08

## 2018-02-01 ENCOUNTER — Encounter: Payer: Self-pay | Admitting: Physical Therapy

## 2018-02-01 ENCOUNTER — Ambulatory Visit: Payer: Medicaid Other | Admitting: Physical Therapy

## 2018-02-01 DIAGNOSIS — M25512 Pain in left shoulder: Principal | ICD-10-CM

## 2018-02-01 DIAGNOSIS — I6300 Cerebral infarction due to thrombosis of unspecified precerebral artery: Secondary | ICD-10-CM

## 2018-02-01 DIAGNOSIS — M6281 Muscle weakness (generalized): Secondary | ICD-10-CM

## 2018-02-01 DIAGNOSIS — G8929 Other chronic pain: Secondary | ICD-10-CM

## 2018-02-01 NOTE — Therapy (Signed)
[place] MAIN [facility] SERVICES [address] [place], [place], [address] Phone: [phone]   Fax:  [phone]  Physical Therapy Treatment  Patient Details  Name: Stanley Farley MRN: [id number] Date of Birth: [birth year] Referring Provider: Donnie Coffin    Encounter Date: 02/01/2018  PT End of Session - 02/01/18 1500    Visit Number  11    Number of Visits  25    Date for PT Re-Evaluation  02/17/18    Authorization Type  medicaid authorization    Authorization Time Period  6/26-8/6    Authorization - Visit Number  7    Authorization - Number of Visits  12    PT Start Time  1638    PT Stop Time  4665    PT Time Calculation (min)  44 min    Activity Tolerance  No increased pain;Patient tolerated treatment well    Behavior During Therapy  [facility] for tasks assessed/performed       Past Medical History:  Diagnosis Date  . Diabetes mellitus without complication ([place])   . Hypertension     Past Surgical History:  Procedure Laterality Date  . TEE WITHOUT CARDIOVERSION N/A 02/04/2017   Procedure: TRANSESOPHAGEAL ECHOCARDIOGRAM (TEE);  Surgeon: Minna Merritts, MD;  Location: [facility] ORS;  Service: Cardiovascular;  Laterality: N/A;    There were no vitals filed for this visit.  Subjective Assessment - 02/01/18 1458    Subjective  Patient reports stretching prior to session; He reports still being upset over not getting disability; Patient reports no pain in shoulder currently;     Pertinent History  Patient is 42 years old and had a CVA 01/22/18 . He was at [facility] for 3 days and then [place] in 7 days. He had HHPT for  1 month following the hospital stay. Patient noticed that over time he has been able to do less and less and it is getting weaker. He is not able to move his arm wihtout pain.     Limitations  Lifting;House hold activities    Patient Stated Goals  Patient wants to use his left hand more and have less pain .    Currently in Pain?   No/denies    Multiple Pain Sites  No        ROM at start of session:  [facility] PT Assessment - 02/01/18 1437      AROM   Left Shoulder Flexion  82 Degrees in supine    Left Shoulder Internal Rotation  55 Degrees supine with 40 degrees abduction    Left Shoulder External Rotation  4 Degrees supine with 40 degrees abduction         TREATMENT: Assessed LUE shoulder ROM at start of session-see above  Patient supine: PT performed grade III-IV inferior, posterior and anterior shoulder joint mobs to LUE glenohumeral joint,20sec bouts x5sets each; PT performed PROM of LUE shoulder flexion, IR/ER, abduction2x10 reps each with overpressure   Patient prone: LUE shoulder strengthening with 1#: Shoulder extension x15 Shoulder low row x15 Shoulder mid row x15 Shoulder flexion x15 Patient required mod VCs and tactile cues for correct exercise technique and to improve functional ROM;  Sidelying: LUE shoulder ER 2x12with 1# arm weight with mod Vcs for positioning to isolate shoulder ER and avoid trunk movement for less compensation;  Supine: Red tband D1 flexion/extension in available ROM x10 reps;  Manual resistance D2 flexion/extension in available ROM with tactile and verbal cues for correct  technique x10 reps;  PT re-assessed LUE shoulder ROM- seebelow:  [facility] PT Assessment - 02/01/18 1513      AROM   Left Shoulder Flexion  100 Degrees in supine; 102 in sitting with slight IR compensation    Left Shoulder Internal Rotation  75 Degrees supine with 40 degrees abduction    Left Shoulder External Rotation  15 Degrees supine with 40 degrees abduction;                          PT Education - 02/01/18 1459    Education provided  Yes    Education Details  ROM/stretches, strengthening, HEP reinforced;     Person(s) Educated  Patient    Methods  Explanation;Demonstration;Verbal cues    Comprehension  Verbalized understanding;Returned demonstration;Verbal cues  required;Need further instruction       PT Short Term Goals - 01/27/18 1707      PT SHORT TERM GOAL #1   Title   Patient will decrease Quick DASH score by > 8 points demonstrating reduced self-reported upper extremity disability.    Baseline  63.63 %, 12/17/17: 65.9%; 01/27/18: 59.1%    Time  6    Period  Weeks    Status  Partially Met    Target Date  01/06/18        PT Long Term Goals - 01/27/18 1630      PT LONG TERM GOAL #1   Title  Patient will be independent in home exercise program to improve strength/mobility for better functional independence with ADLs.    Baseline  not adherent;     Time  12    Period  Weeks    Status  Not Met    Target Date  02/17/18      PT LONG TERM GOAL #2   Title  Patient will increase BUE gross strength to 4+/5 as to improve functional strength for independent gait, increased standing tolerance and increased ADL ability.    Baseline  -2/5 left shoulder, left elbow, left hand; 12/17/17: 2-/5 gross strength in LUE shoulder/elbow and hand; 01/27/18: 2-/5 in shoulder, elbow: 3-/5    Time  12    Period  Weeks    Status  Not Met    Target Date  02/17/18      PT LONG TERM GOAL #3   Title  Patient will be able to perform household work/ chores without increase in symptoms.    Baseline  10/10 pain left shoulder 01/27/18: 0/10 pain, reports being mod I for self care ADLs;     Time  12    Period  Weeks    Status  Achieved    Target Date  02/17/18      PT LONG TERM GOAL #4   Title  Patient will report a worst pain of 3/10 on VAS in left shoulder to improve tolerance with ADLs and reduced symptoms with activities.     Baseline   3/10 worse pain     Time  12    Period  Weeks    Status  Achieved    Target Date  02/17/18            Plan - 02/01/18 1529    Clinical Impression Statement  Patient reports stretching more at home over the weekend. He presents with continued stiffness in LUE however with repeated PROM/joint mobilization was able to  achieve increased ROM by end of session. Patient requires mod VCs for  correct exercise technique/positioning for better muscle activation. He denies any pain at end of session. He would benefit from additional skilled PT intervention to improve strength and mobility while reducing shoulder discomfort;     Rehab Potential  Fair    PT Frequency  2x / week    PT Treatment/Interventions  Aquatic Therapy;Moist Heat;Ultrasound;Electrical Stimulation;Cryotherapy;ADLs/Self Care Home Management;Therapeutic activities;Therapeutic exercise;Patient/family education;Passive range of motion    PT Next Visit Plan  PROM, pulley, manual therapy left shoulder    Consulted and Agree with Plan of Care  Patient       Patient will benefit from skilled therapeutic intervention in order to improve the following deficits and impairments:  Decreased strength, Pain, Postural dysfunction, Impaired UE functional use, Impaired flexibility, Decreased coordination, Decreased activity tolerance, Decreased range of motion, Hypomobility  Visit Diagnosis: Chronic left shoulder pain  Muscle weakness (generalized)  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 Poorly controlled type 2 diabetes mellitus with peripheral neuropathy ([place])   . Benign essential HTN   . Tobacco abuse   . Marijuana abuse   . Hyperlipidemia   . H/O medication noncompliance   . Acute CVA (cerebrovascular accident) ([place]) 02/01/2017    Trotter,Margaret PT, DPT 02/01/2018, 3:30 PM  [place] MAIN [facility] SERVICES [address] [place], [place], [address] Phone: [phone]   Fax:  [phone]  Name: Stanley Farley MRN: [id number] Date of Birth: August 20, 1975

## 2018-02-04 ENCOUNTER — Ambulatory Visit: Payer: Medicaid Other | Attending: Family Medicine | Admitting: Physical Therapy

## 2018-02-04 ENCOUNTER — Encounter: Payer: Self-pay | Admitting: Physical Therapy

## 2018-02-04 DIAGNOSIS — M25512 Pain in left shoulder: Secondary | ICD-10-CM | POA: Diagnosis present

## 2018-02-04 DIAGNOSIS — M6281 Muscle weakness (generalized): Secondary | ICD-10-CM | POA: Diagnosis present

## 2018-02-04 DIAGNOSIS — G8929 Other chronic pain: Secondary | ICD-10-CM | POA: Diagnosis present

## 2018-02-04 DIAGNOSIS — I6300 Cerebral infarction due to thrombosis of unspecified precerebral artery: Secondary | ICD-10-CM | POA: Diagnosis present

## 2018-02-04 NOTE — Therapy (Signed)
[facility] SERVICES [address] [place], [place], [address] Phone: [phone]   Fax:  [phone]  Physical Therapy Treatment Physical Therapy Progress Note   Dates of reporting period  12/17/17   to   02/04/18   Patient Details  Name: Stanley Farley MRN: [id number] Date of Birth: 07/11/1975 Referring Provider: AYCOCK, NGWE A    Encounter Date: 02/04/2018  PT End of Session - 02/04/18 1538    Visit Number  12    Number of Visits  33    Date for PT Re-Evaluation  03/04/18    Authorization Type  medicaid authorization    Authorization Time Period  6/26-8/6    Authorization - Visit Number  8    Authorization - Number of Visits  12    PT Start Time  1516    PT Stop Time  1600    PT Time Calculation (min)  44 min    Activity Tolerance  No increased pain;Patient tolerated treatment well    Behavior During Therapy  WFL for tasks assessed/performed       Past Medical History:  Diagnosis Date  . Diabetes mellitus without complication (HCC)   . Hypertension     Past Surgical History:  Procedure Laterality Date  . TEE WITHOUT CARDIOVERSION N/A 02/04/2017   Procedure: TRANSESOPHAGEAL ECHOCARDIOGRAM (TEE);  Surgeon: Gollan, Timothy J, MD;  Location: [facility] ORS;  Service: Cardiovascular;  Laterality: N/A;    There were no vitals filed for this visit.  Subjective Assessment - 02/04/18 1537    Subjective  Patient reports stretching his left shoulder about an hour ago. He denies any pain;     Pertinent History  Patient is 41 years old and had a CVA 01/22/18 . He was at [facility] for 3 days and then [place] in 7 days. He had HHPT for  1 month following the hospital stay. Patient noticed that over time he has been able to do less and less and it is getting weaker. He is not able to move his arm wihtout pain.     Limitations  Lifting;House hold activities    Patient Stated Goals  Patient wants to use his left hand more and have less pain .     Currently in Pain?  No/denies    Multiple Pain Sites  No         [facility] PT Assessment - 02/04/18 1522      AROM   Left Shoulder Flexion  70 Degrees in sitting    Left Shoulder Internal Rotation  60 Degrees supine with 45 degrees abduction    Left Shoulder External Rotation  10 Degrees supine with 45 degrees abduction         TREATMENT: Assessed LUE shoulder ROM at start of session-see above  Patient supine: PT performed gradeIII-IVinferior, posterior and anterior shoulder joint mobs to LUE glenohumeral joint,20sec bouts x5 reps 2 sets each; PT performed PROM of LUE shoulder flexion, IR/ER, abduction3x10 reps each with overpressure Contract-relax LUE contract IR, relax into ER 5 sec hold x3 reps, x5 sets to facilitate better shoulder ER:   Patient prone: LUE shoulder strengthening with 1#: Shoulder extensionx15 Shoulder low rowx15 Shoulder mid rowx15 Shoulder flexionx15 Patient required mod VCs and tactile cues for correct exercise technique and to improve functional ROM;  Sidelying: LUE shoulder ER x15with 1# arm weight with mod Vcs for positioning to isolate shoulder ER and avoid trunk movement for less compensation; LUE scapular protraction/retraction, elevation/depression   with manual therapist resistance x10 reps each;  PT re-assessed LUE shoulder ROM- seebelow:      [facility] PT Assessment - 02/04/18 1551      Observation/Other Assessments   Quick DASH   63% (the lower the score the better mobility) no change from 12/17/17: 65%     AROM   Left Shoulder Flexion  80 Degrees in sitting; no significant change from 12/17/17: 75 degrees    Left Shoulder ABduction  65 Degrees in sitting; no significant change from 12/17/17: 60 degrees    Left Shoulder Internal Rotation  75 Degrees supine with 45 d. abd; sign. improvement from 12/17/17: 55    Left Shoulder External Rotation  20 Degrees supine with 45 d. abd sign improvement from 12/17/17: 0 degre                       PT Education - 02/04/18 1537    Education provided  Yes    Education Details  ROM/stretches, manual therapy, strengthening; HEP reinforced;     Person(s) Educated  Patient    Methods  Explanation;Demonstration;Verbal cues    Comprehension  Verbalized understanding;Returned demonstration;Verbal cues required;Need further instruction       PT Short Term Goals - 02/04/18 1651      PT SHORT TERM GOAL #1   Title   Patient will decrease Quick DASH score by > 8 points demonstrating reduced self-reported upper extremity disability.    Baseline  63.63 %, 12/17/17: 65.9%; 01/27/18: 59.1%; 02/04/18: 63%    Time  4    Period  Weeks    Status  Partially Met    Target Date  03/04/18        PT Long Term Goals - 02/04/18 1650      PT LONG TERM GOAL #1   Title  Patient will be independent in home exercise program to improve strength/mobility for better functional independence with ADLs.    Baseline  unsure of adherence; inconsistent;     Time  4    Period  Weeks    Status  Not Met    Target Date  03/04/18      PT LONG TERM GOAL #2   Title  Patient will increase BUE gross strength to 4+/5 as to improve functional strength for independent gait, increased standing tolerance and increased ADL ability.    Baseline  -2/5 left shoulder, left elbow, left hand; 12/17/17: 2-/5 gross strength in LUE shoulder/elbow and hand; 01/27/18: 2-/5 in shoulder, elbow: 3-/5; 02/04/18: 2-/5 gross strength in LUE shoulder    Time  4    Period  Weeks    Status  Not Met    Target Date  03/04/18      PT LONG TERM GOAL #3   Title  Patient will be able to perform household work/ chores without increase in symptoms.    Baseline  10/10 pain left shoulder 01/27/18: 0/10 pain, reports being mod I for self care ADLs;     Time  4    Period  Weeks    Status  Achieved    Target Date  03/04/18      PT LONG TERM GOAL #4   Title  Patient will report a worst pain of 3/10 on VAS in left shoulder to  improve tolerance with ADLs and reduced symptoms with activities.     Baseline   3/10 worse pain     Time  4    Period  Weeks  Status  Achieved    Target Date  03/04/18            Plan - 02/04/18 1610    Clinical Impression Statement  Patient continues to have stiffness in LUE shoulder especially in shoulder ER, flexion and abduction. PT performed extensive manual therapy to facilitate better shoulder ROM. He was able to achieve significant improvement in shoulder ROM within session. However, concerned about efficacy of physical therapy as there is not significant change in ROM between sessions. Patient reports adherence to HEP however unsure of accuracy as patient is not exhibiting significant change in ROM. He has missed a few sessions and was late several times which significantly limited progress. He would benefit from additional skilled PT intervention to improve shoulder ROM and strength;  Patient's condition has the potential to improve in response to therapy. Maximum improvement is yet to be obtained. The anticipated improvement is attainable and reasonable in a generally predictable time. Start date of reporting period 06/13/19end date of reporting period08/01/19. Patient reports stretching at home, however he is still limited in shoulder ROM.    Rehab Potential  Fair    PT Frequency  2x / week , x4 weeks   PT Treatment/Interventions  Aquatic Therapy;Moist Heat;Ultrasound;Electrical Stimulation;Cryotherapy;ADLs/Self Care Home Management;Therapeutic activities;Therapeutic exercise;Patient/family education;Passive range of motion    PT Next Visit Plan  PROM, pulley, manual therapy left shoulder    Consulted and Agree with Plan of Care  Patient       Patient will benefit from skilled therapeutic intervention in order to improve the following deficits and impairments:  Decreased strength, Pain, Postural dysfunction, Impaired UE functional use, Impaired flexibility, Decreased  coordination, Decreased activity tolerance, Decreased range of motion, Hypomobility  Visit Diagnosis: Chronic left shoulder pain  Muscle weakness (generalized)  Cerebral infarction due to thrombosis of precerebral artery (HCC)     Problem List Patient Active Problem List   Diagnosis Date Noted  . 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 Poorly controlled type 2 diabetes mellitus with peripheral neuropathy (HCC)   . Benign essential HTN   . Tobacco abuse   . Marijuana abuse   . Hyperlipidemia   . H/O medication noncompliance   . Acute CVA (cerebrovascular accident) (HCC) 02/01/2017    Trotter,Margaret PT, DPT 02/04/2018, 4:51 PM  [facility] [address] [place], [place], [address] Phone: [phone]   Fax:  [phone]  Name: Stanley Farley MRN: [id number] Date of Birth: 03/15/1976   

## 2018-02-04 NOTE — Addendum Note (Signed)
Addended by: Zettie PhoROTTER, Almon [place] E on: 02/04/2018 04:53 PM   Modules accepted: Orders

## 2018-02-08 ENCOUNTER — Ambulatory Visit: Payer: Medicaid Other | Admitting: Physical Therapy

## 2018-02-15 ENCOUNTER — Encounter: Payer: Self-pay | Admitting: Physical Therapy

## 2018-02-15 ENCOUNTER — Ambulatory Visit: Payer: Medicaid Other | Admitting: Physical Therapy

## 2018-02-15 DIAGNOSIS — M25512 Pain in left shoulder: Principal | ICD-10-CM

## 2018-02-15 DIAGNOSIS — G8929 Other chronic pain: Secondary | ICD-10-CM

## 2018-02-15 DIAGNOSIS — M6281 Muscle weakness (generalized): Secondary | ICD-10-CM

## 2018-02-15 NOTE — Therapy (Signed)
[place] [facility] SERVICES [address] [place], [place], [address] Phone: [phone]   Fax:  [phone]  Physical Therapy Treatment  Patient Details  Name: Stanley Farley MRN: [id number] Date of Birth: 10/20/1975 Referring Provider: Donnie Coffin    Encounter Date: 02/15/2018  PT End of Session - 02/15/18 1652    Visit Number  13    Number of Visits  33    Date for PT Re-Evaluation  03/04/18    Authorization Type  medicaid authorization    Authorization Time Period  8/12-9/22    Authorization - Visit Number  1    Authorization - Number of Visits  12    PT Start Time  8676    PT Stop Time  1730    PT Time Calculation (min)  44 min    Activity Tolerance  No increased pain;Patient tolerated treatment well    Behavior During Therapy  [facility] for tasks assessed/performed       Past Medical History:  Diagnosis Date  . Diabetes mellitus without complication ([place])   . Hypertension     Past Surgical History:  Procedure Laterality Date  . TEE WITHOUT CARDIOVERSION N/A 02/04/2017   Procedure: TRANSESOPHAGEAL ECHOCARDIOGRAM (TEE);  Surgeon: Minna Merritts, MD;  Location: [facility] ORS;  Service: Cardiovascular;  Laterality: N/A;    There were no vitals filed for this visit.  Subjective Assessment - 02/15/18 1651    Subjective  [place] and then he fell on the left side. He reports that with the fall he started having increased left shoulder pain. He reports it just stopped hurting today and he was able to do the stretches this morning;     Pertinent History  Patient is 42 years old and had a CVA 01/22/18 . He was at [facility] for 3 days and then [place] in 7 days. He had HHPT for  1 month following the hospital stay. Patient noticed that over time he has been able to do less and less and it is getting weaker. He is not able to move his arm wihtout pain.     Limitations  Lifting;House hold activities    Patient  Stated Goals  Patient wants to use his left hand more and have less pain .    Currently in Pain?  No/denies    Multiple Pain Sites  No         [facility] PT Assessment - 02/15/18 1653      AROM   Left Shoulder Flexion  60 Degrees   in supine   Left Shoulder Internal Rotation  50 Degrees   in supine with 40 degrees abduction   Left Shoulder External Rotation  2 Degrees   supine with 40 degrees with abduction       TREATMENT: Assessed LUE shoulder ROM at start of session-see above  Patient supine: PT performed gradeIII-IVinferior, posterior and anterior shoulder joint mobs to LUE glenohumeral joint,20sec bouts x5 reps 2 sets each; PT performed PROM of LUE shoulder flexion, IR/ER, abduction4x10 reps each with overpressure  Patient supine: BUE wand flexion 2# x15 with cues to avoid excessive IR of LUE for better shoulder ROM; BUE wand chest press 2# x15 with cues to keep LUE elbow in by side for better shoulder ROM;  Patient prone: LUE shoulder strengthening with 2#: Shoulder extensionx10 Shoulder low rowx10 Shoulder mid rowx10 Patient required mod VCs and tactile cues for correct exercise  technique and to improve functional ROM;   PT re-assessed LUE shoulder ROM- seebelow:    [facility] PT Assessment - 02/15/18 1726      AROM   Left Shoulder Flexion  70 Degrees    Left Shoulder Internal Rotation  65 Degrees    Left Shoulder External Rotation  6 Degrees                         PT Education - 02/15/18 1652    Education provided  Yes    Education Details  ROM/stretches, manual therapy, strengthening;     Person(s) Educated  Patient    Methods  Explanation;Demonstration;Verbal cues    Comprehension  Verbalized understanding;Returned demonstration;Verbal cues required;Need further instruction       PT Short Term Goals - 02/04/18 1651      PT SHORT TERM GOAL #1   Title   Patient will decrease Quick DASH score by > 8 points demonstrating  reduced self-reported upper extremity disability.    Baseline  63.63 %, 12/17/17: 65.9%; 01/27/18: 59.1%; 02/04/18: 63%    Time  4    Period  Weeks    Status  Partially Met    Target Date  03/04/18        PT Long Term Goals - 02/04/18 1650      PT LONG TERM GOAL #1   Title  Patient will be independent in home exercise program to improve strength/mobility for better functional independence with ADLs.    Baseline  unsure of adherence; inconsistent;     Time  4    Period  Weeks    Status  Not Met    Target Date  03/04/18      PT LONG TERM GOAL #2   Title  Patient will increase BUE gross strength to 4+/5 as to improve functional strength for independent gait, increased standing tolerance and increased ADL ability.    Baseline  -2/5 left shoulder, left elbow, left hand; 12/17/17: 2-/5 gross strength in LUE shoulder/elbow and hand; 01/27/18: 2-/5 in shoulder, elbow: 3-/5; 02/04/18: 2-/5 gross strength in LUE shoulder    Time  4    Period  Weeks    Status  Not Met    Target Date  03/04/18      PT LONG TERM GOAL #3   Title  Patient will be able to perform household work/ chores without increase in symptoms.    Baseline  10/10 pain left shoulder 01/27/18: 0/10 pain, reports being mod I for self care ADLs;     Time  4    Period  Weeks    Status  Achieved    Target Date  03/04/18      PT LONG TERM GOAL #4   Title  Patient will report a worst pain of 3/10 on VAS in left shoulder to improve tolerance with ADLs and reduced symptoms with activities.     Baseline   3/10 worse pain     Time  4    Period  Weeks    Status  Achieved    Target Date  03/04/18            Plan - 02/15/18 1724    Clinical Impression Statement  Patient reports having fall in last week and states that his shoulder was sore and therefore he didn't do any stretches. He reports that the pain went away today prior to session and reported no pain at start of session.  PT assessed ROM at start of session- patient exhibits  significant stiffness as compared to previous sessions. PT performed extensive manual therapy to improve ROM. Advanced strengthening with increased resistance. Towards end of session patient was complaining of increased left thoracolumbar pain, unsure of origin as exercise shoulder not have exacerbated back pain; Patient exhibits slight improvement within session but is still limited in ROM as compared to last session. He would benefit from additional skilled PT Intervention to improve ROM, strength for better mobility;     Rehab Potential  Fair    PT Frequency  2x / week    PT Treatment/Interventions  Aquatic Therapy;Moist Heat;Ultrasound;Electrical Stimulation;Cryotherapy;ADLs/Self Care Home Management;Therapeutic activities;Therapeutic exercise;Patient/family education;Passive range of motion    PT Next Visit Plan  PROM, pulley, manual therapy left shoulder    Consulted and Agree with Plan of Care  Patient       Patient will benefit from skilled therapeutic intervention in order to improve the following deficits and impairments:  Decreased strength, Pain, Postural dysfunction, Impaired UE functional use, Impaired flexibility, Decreased coordination, Decreased activity tolerance, Decreased range of motion, Hypomobility  Visit Diagnosis: Chronic left shoulder pain  Muscle weakness (generalized)     Problem List Patient Active Problem List   Diagnosis Date Noted  . Thrombotic cerebral infarction ([place]) 02/04/2017  . 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 Poorly controlled type 2 diabetes mellitus with peripheral neuropathy ([place])   . Benign essential HTN   . Tobacco abuse   . Marijuana abuse   . Hyperlipidemia   . H/O medication noncompliance   . Acute CVA (cerebrovascular accident) ([place]) 02/01/2017    Gaetan Spieker PT, DPT 02/15/2018, 5:35 PM  [place] [facility] SERVICES [address]  [place], [place], [address] Phone: [phone]   Fax:  [phone]  Name: Stanley Farley MRN: [id number] Date of Birth: 1976/07/04

## 2018-02-17 ENCOUNTER — Encounter: Payer: Self-pay | Admitting: Physical Therapy

## 2018-02-17 ENCOUNTER — Ambulatory Visit: Payer: Medicaid Other | Admitting: Physical Therapy

## 2018-02-17 DIAGNOSIS — G8929 Other chronic pain: Secondary | ICD-10-CM

## 2018-02-17 DIAGNOSIS — M6281 Muscle weakness (generalized): Secondary | ICD-10-CM

## 2018-02-17 DIAGNOSIS — M25512 Pain in left shoulder: Principal | ICD-10-CM

## 2018-02-17 DIAGNOSIS — I6300 Cerebral infarction due to thrombosis of unspecified precerebral artery: Secondary | ICD-10-CM

## 2018-02-17 NOTE — Therapy (Signed)
[place] MAIN [facility] SERVICES [address] [place], [place], [address] Phone: [phone]   Fax:  [phone]  Physical Therapy Treatment  Patient Details  Name: Stanley Farley MRN: [id number] Date of Birth: May 20, 1976 Referring Provider: Donnie Coffin    Encounter Date: 02/17/2018  PT End of Session - 02/17/18 1644    Visit Number  14    Number of Visits  33    Date for PT Re-Evaluation  03/04/18    Authorization Type  medicaid authorization    Authorization Time Period  8/12-9/22    Authorization - Visit Number  2    Authorization - Number of Visits  12    PT Start Time  6962    PT Stop Time  1726    PT Time Calculation (min)  41 min    Activity Tolerance  No increased pain;Patient tolerated treatment well    Behavior During Therapy  [facility] for tasks assessed/performed       Past Medical History:  Diagnosis Date  . Diabetes mellitus without complication ([place])   . Hypertension     Past Surgical History:  Procedure Laterality Date  . TEE WITHOUT CARDIOVERSION N/A 02/04/2017   Procedure: TRANSESOPHAGEAL ECHOCARDIOGRAM (TEE);  Surgeon: Minna Merritts, MD;  Location: [facility] ORS;  Service: Cardiovascular;  Laterality: N/A;    There were no vitals filed for this visit.  Subjective Assessment - 02/17/18 1654    Subjective  Patient reports not stretching today stating, "I was at the doctor with my mom and I didn't have time." He reports inconsistency with stretching;     Pertinent History  Patient is 42 years old and had a CVA 01/22/18 . He was at [facility] for 3 days and then [place] in 7 days. He had HHPT for  1 month following the hospital stay. Patient noticed that over time he has been able to do less and less and it is getting weaker. He is not able to move his arm wihtout pain.     Limitations  Lifting;House hold activities    Patient Stated Goals  Patient wants to use his left hand more and have less pain .    Currently in Pain?   No/denies    Multiple Pain Sites  No         [facility] PT Assessment - 02/17/18 1652      AROM   Left Shoulder Flexion  85 Degrees   in supine   Left Shoulder ABduction  65 Degrees   in supine   Left Shoulder Internal Rotation  55 Degrees   supine with 45 degrees abduction   Left Shoulder External Rotation  4 Degrees   supine with 45 degrees abduction         TREATMENT: Assessed LUE shoulder ROM at start of session-see above  Patient supine: PT performed gradeIII-IVinferior, posterior and anterior shoulder joint mobs to LUE glenohumeral joint,20sec bouts x5reps 2 setseach; PT performed PROM of LUE shoulder flexion, IR/ER, abduction4x10 reps each with overpressure  Patient prone: LUE shoulder strengthening with 2#: Shoulder extensionx10 Shoulder low rowx10 Shoulder mid rowx10 Patient required mod VCs and tactile cues for correct exercise technique and to improve functional ROM;  educated patient in gym exercise as patient expressed interest in going to gym:  Low row plate #2 X52 reps;  BUE bicep curl, plate #2    Attempted but patient unable; BUE tricep press Plate   #2 W41 reps;   Patient  supine: 2# bar chest press x15 2# bar shoulder flexion AAROM x15 Patient required min-moderate verbal/tactile cues for correct exercise technique.     Assessed ROM at end of session: [facility] PT Assessment - 02/17/18 1721      AROM   Left Shoulder Flexion  85 Degrees   supine   Left Shoulder ABduction  90 Degrees   supine   Left Shoulder Internal Rotation  66 Degrees   supine wiht 45 degrees abduction   Left Shoulder External Rotation  8 Degrees   supine wiht 45 degrees abduction                PT Education - 02/17/18 1644    Education provided  Yes    Education Details  ROM/stretches, manual therapy;     Person(s) Educated  Patient    Comprehension  Verbalized understanding;Need further instruction       PT Short Term Goals - 02/04/18 1651       PT SHORT TERM GOAL #1   Title   Patient will decrease Quick DASH score by > 8 points demonstrating reduced self-reported upper extremity disability.    Baseline  63.63 %, 12/17/17: 65.9%; 01/27/18: 59.1%; 02/04/18: 63%    Time  4    Period  Weeks    Status  Partially Met    Target Date  03/04/18        PT Long Term Goals - 02/04/18 1650      PT LONG TERM GOAL #1   Title  Patient will be independent in home exercise program to improve strength/mobility for better functional independence with ADLs.    Baseline  unsure of adherence; inconsistent;     Time  4    Period  Weeks    Status  Not Met    Target Date  03/04/18      PT LONG TERM GOAL #2   Title  Patient will increase BUE gross strength to 4+/5 as to improve functional strength for independent gait, increased standing tolerance and increased ADL ability.    Baseline  -2/5 left shoulder, left elbow, left hand; 12/17/17: 2-/5 gross strength in LUE shoulder/elbow and hand; 01/27/18: 2-/5 in shoulder, elbow: 3-/5; 02/04/18: 2-/5 gross strength in LUE shoulder    Time  4    Period  Weeks    Status  Not Met    Target Date  03/04/18      PT LONG TERM GOAL #3   Title  Patient will be able to perform household work/ chores without increase in symptoms.    Baseline  10/10 pain left shoulder 01/27/18: 0/10 pain, reports being mod I for self care ADLs;     Time  4    Period  Weeks    Status  Achieved    Target Date  03/04/18      PT LONG TERM GOAL #4   Title  Patient will report a worst pain of 3/10 on VAS in left shoulder to improve tolerance with ADLs and reduced symptoms with activities.     Baseline   3/10 worse pain     Time  4    Period  Weeks    Status  Achieved    Target Date  03/04/18            Plan - 02/18/18 0834    Clinical Impression Statement  Patient reports not stretching today as he took his mom to a doctor's appointment. ROM measurements reveal no significant change  in ROM from last session. Pateint  re-educated on importance of HEP adherence. Patient provide conflicting response to HEP adherence. He states one minute that he does the exercise and then the next minutes states, "I didn't do them today." unsure of accuracy. Patient educated on gym exercise as he expressed interest in working out at a gym. Patient required cues for correct exercise. Patient expressed concern over lack of hand movement. Instructed patient that OT would be better for hand treatment. He is waiting on MD order for OT and PT for LE treatment. Due to increased therapy needed, PT will  reduce visits to 1x a week to conserve visits. Patient would benefit from additional skilled PT intervention to improve strength, ROM and reduce pain with ADLs;     Rehab Potential  Fair    PT Frequency  2x / week    PT Treatment/Interventions  Aquatic Therapy;Moist Heat;Ultrasound;Electrical Stimulation;Cryotherapy;ADLs/Self Care Home Management;Therapeutic activities;Therapeutic exercise;Patient/family education;Passive range of motion    PT Next Visit Plan  PROM, pulley, manual therapy left shoulder    Consulted and Agree with Plan of Care  Patient       Patient will benefit from skilled therapeutic intervention in order to improve the following deficits and impairments:  Decreased strength, Pain, Postural dysfunction, Impaired UE functional use, Impaired flexibility, Decreased coordination, Decreased activity tolerance, Decreased range of motion, Hypomobility  Visit Diagnosis: Chronic left shoulder pain  Muscle weakness (generalized)  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 Thrombotic cerebral infarction ([place]) 02/04/2017  . 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 Poorly controlled type 2 diabetes mellitus with peripheral neuropathy ([place])   . Benign essential HTN   . Tobacco abuse    . Marijuana abuse   . Hyperlipidemia   . H/O medication noncompliance   . Acute CVA (cerebrovascular accident) ([place]) 02/01/2017    Sallie Staron PT, DPT 02/18/2018, 8:44 AM  [place] MAIN [facility] SERVICES [address] [place], [place], [address] Phone: [phone]   Fax:  [phone]  Name: Stanley Farley MRN: [id number] Date of Birth: 05-09-1976

## 2018-02-17 NOTE — Patient Instructions (Addendum)
(  Home) Retraction: Row - Bilateral (Anchor)    Facing anchor, arms reaching forward, pull hands toward stomach, pinching shoulder blades together. Repeat _12-15___ times per set. Do __2__ sets per session. Do _3___ sessions per week. Use ___25_ lb weights.  Copyright  VHI. All rights reserved.  Curl: Sitting (Machine)   Copyright  VHI. All rights reserved.  Extension: Sitting (Machine)    Sit with shoulders slightly higher than support pad. Straighten arms. Do _2___ sets. Complete _12___ repetitions. 10# http://st.exer.us/602   Copyright  VHI. All rights reserved.

## 2018-02-23 ENCOUNTER — Ambulatory Visit: Payer: Medicaid Other | Admitting: Physical Therapy

## 2018-02-25 ENCOUNTER — Encounter: Payer: Self-pay | Admitting: Gastroenterology

## 2018-03-01 ENCOUNTER — Encounter: Payer: Self-pay | Admitting: Physical Therapy

## 2018-03-01 ENCOUNTER — Ambulatory Visit: Payer: Medicaid Other | Admitting: Physical Therapy

## 2018-03-01 DIAGNOSIS — G8929 Other chronic pain: Secondary | ICD-10-CM

## 2018-03-01 DIAGNOSIS — I6300 Cerebral infarction due to thrombosis of unspecified precerebral artery: Secondary | ICD-10-CM

## 2018-03-01 DIAGNOSIS — M25512 Pain in left shoulder: Secondary | ICD-10-CM | POA: Diagnosis not present

## 2018-03-01 DIAGNOSIS — M6281 Muscle weakness (generalized): Secondary | ICD-10-CM
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
## 2018-03-03 ENCOUNTER — Encounter: Payer: Medicaid Other | Admitting: Physical Therapy

## 2018-03-09 ENCOUNTER — Ambulatory Visit: Payer: Medicaid Other | Attending: Family Medicine | Admitting: Physical Therapy

## 2018-03-11 ENCOUNTER — Encounter: Payer: Medicaid Other | Admitting: Physical Therapy

## 2018-03-15 ENCOUNTER — Ambulatory Visit: Payer: Medicaid Other | Admitting: Physical Therapy

## 2018-03-17 ENCOUNTER — Encounter: Payer: Medicaid Other | Admitting: Physical Therapy

## 2018-03-22 ENCOUNTER — Encounter: Payer: Self-pay | Admitting: Physical Therapy

## 2018-03-22 ENCOUNTER — Encounter: Payer: Medicaid Other | Admitting: Physical Therapy

## 2018-03-22 DIAGNOSIS — I6300 Cerebral infarction due to thrombosis of unspecified precerebral artery: Secondary | ICD-10-CM

## 2018-03-22 DIAGNOSIS — M25512 Pain in left shoulder: Principal | ICD-10-CM

## 2018-03-22 DIAGNOSIS — G8929 Other chronic pain: Secondary | ICD-10-CM

## 2018-03-22 DIAGNOSIS — M6281 Muscle weakness (generalized): Secondary | ICD-10-CM

## 2018-03-22 NOTE — Therapy (Unsigned)
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  The treatment consisted of manual therapy, stretches, joint mobs, HEP, strengthening;  The patient is: Unchanged  Subjective: Patient is inconsistent with HEP  Discharge Findings: unable to obtain objective measures as patient failed to return to therapy;     Goals Partially Met    Sincerely,   Keiarra Charon, PT, DPT 03/22/18    CC _1 @  [place] [address] [place], [place], [address] Phone: [phone]   Fax:  [phone]  Patient: Stanley Farley  MRN: [id number]  Date of Birth: Aug 14, 1975

## 2018-03-24 ENCOUNTER — Encounter: Payer: Medicaid Other | Admitting: Physical Therapy

## 2019-04-18 ENCOUNTER — Other Ambulatory Visit: Payer: Self-pay

## 2019-04-18 DIAGNOSIS — Z20822 Contact with and (suspected) exposure to covid-19: Secondary | ICD-10-CM

## 2019-04-19 LAB — NOVEL CORONAVIRUS, NAA: SARS-CoV-2, NAA: NOT DETECTED

## 2019-04-20 ENCOUNTER — Telehealth: Payer: Self-pay | Admitting: Family Medicine

## 2019-04-20 NOTE — Telephone Encounter (Signed)
Patient was informed of negative test results and requests that results be mailed. Address and number confirmed.  

## 2019-06-06 ENCOUNTER — Ambulatory Visit: Payer: Medicaid Other

## 2019-06-07 ENCOUNTER — Ambulatory Visit: Payer: Medicaid Other | Admitting: Physical Therapy

## 2019-06-15 ENCOUNTER — Ambulatory Visit: Payer: Medicaid Other

## 2019-06-20 ENCOUNTER — Ambulatory Visit: Payer: Medicaid Other
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
## 2019-06-23 ENCOUNTER — Ambulatory Visit: Payer: Medicaid Other | Attending: Family Medicine

## 2019-06-23 ENCOUNTER — Other Ambulatory Visit: Payer: Self-pay

## 2019-06-23 ENCOUNTER — Ambulatory Visit: Payer: Medicaid Other

## 2019-06-23 VITALS — BP 140/72 | HR 70

## 2019-06-23 DIAGNOSIS — G8929 Other chronic pain: Secondary | ICD-10-CM | POA: Diagnosis present

## 2019-06-23 DIAGNOSIS — I6300 Cerebral infarction due to thrombosis of unspecified precerebral artery: Secondary | ICD-10-CM | POA: Insufficient documentation

## 2019-06-23 DIAGNOSIS — M25512 Pain in left shoulder: Secondary | ICD-10-CM | POA: Insufficient documentation

## 2019-06-23 DIAGNOSIS — M6281 Muscle weakness (generalized): Secondary | ICD-10-CM | POA: Insufficient documentation

## 2019-06-23 NOTE — Therapy (Signed)
[place] MAIN [facility] SERVICES [address] [place], [place], [address] Phone: [phone]   Fax:  [phone]  Physical Therapy Evaluation  Patient Details  Name: Stanley Farley MRN: [id number] Date of Birth: 19-May-1976 Referring Provider (PT): Tomasa Hose   Encounter Date: 06/23/2019  PT End of Session - 06/23/19 1723    Visit Number  1    Number of Visits  25    Date for PT Re-Evaluation  09/15/19    Authorization Type  eval: 06/23/19    PT Start Time  1110    PT Stop Time  1205    PT Time Calculation (min)  55 min    Equipment Utilized During Treatment  Gait belt    Activity Tolerance  Patient tolerated treatment well    Behavior During Therapy  WFL for tasks assessed/performed       Past Medical History:  Diagnosis Date  . Diabetes mellitus without complication ([place])   . Hypertension     Past Surgical History:  Procedure Laterality Date  . TEE WITHOUT CARDIOVERSION N/A 02/04/2017   Procedure: TRANSESOPHAGEAL ECHOCARDIOGRAM (TEE);  Surgeon: Minna Merritts, MD;  Location: [facility] ORS;  Service: Cardiovascular;  Laterality: N/A;    Vitals:   06/23/19 1121  BP: 140/72  Pulse: 70  SpO2: 100%     Subjective Assessment - 06/23/19 1122    Subjective  CVA with LLE/LUE weakness    Pertinent History  Pt had a CVA 02/01/17 with LUE/LLE weakness. He was at [facility] for 3 days and then [place] in 7 days. He had HHPT for  1 month following the hospital stay. He came to [facility] for OP PT. He was referred back for additional therapy due to continued difficulty with LLE weakness. 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 He is struggling with gripping and lifting heavy boxes. He also reports hyperextension of L knee.    Patient Stated Goals  Improve his L knee and ankle strength, improve his LUE strength    Currently in Pain?  No/denies        SUBJECTIVE Chief complaint: CVA Onset:  Pt  had a CVA 02/01/17 with LUE/LLE weakness. He was at [facility] for 3 days and then [place] in 7 days. He had HHPT for  1 month following the hospital stay. He came to [facility] for OP PT. He was referred back for additional therapy due to continued difficulty with LLE weakness. 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 He is struggling with gripping and lifting heavy boxes. He also reports hyperextension of L knee.   Recent changes in overall health/medication: Recently lowered insulin amount but otherwise no changes Prior history of physical therapy: After initial episode Follow-up appointment with MD: None scheduled Red flags (bowel/bladder changes, saddle paresthesia, personal history of cancer, chills/fever, night sweats, unrelenting pain) Negative   OBJECTIVE  MUSCULOSKELETAL: Tremor: Absent Bulk: Possible mild atrophy of L calf and anterior tibialis compared to RLE  Posture No gross abnormalities noted in standing or seated posture  Gait Decreased stance time on LLE with L foot drop as well as foot slap and and L knee hyperextension when in L stance  Strength R/L 5/4- Hip flexion 5/2+ Hip external rotation 4+/2- Hip internal rotation 4/4- Hip extension  4+/2+ Hip abduction 4+/2+ Hip adduction 5/4+ Knee extension 5/3 Knee flexion 5/2+ Ankle Plantarflexion  5/2+ Ankle Dorsiflexion 5/4- Shoulder flexion 5/4- Shoulder abduction 5/5 Elbow flexion 5/4 Elbow extension Weakness in L forearm supination, radial deviation, composite finger extension, decreased grip strength;   NEUROLOGICAL:  Sensation Grossly intact to light touch bilateral UEs/LEs as determined by testing dermatomes C2-T2/L2-S2 respectively Proprioception and hot/cold testing deferred on this date  Reflexes Deferred  FUNCTIONAL OUTCOME MEASURES   Results Comments  BERG 51/56 Low fall risk  DGI 22/24 WNL  TUG 11.6 seconds WNL  5TSTS 11.9 seconds WNL  10  Meter Gait Speed Self-selected: 11.1s = 0.90 m/s; Fastest: 7.5s = 1.61m/s Self-selective below normative values for full community ambulation  ABC Scale 88.13% WNL  LEFS 60/80 Impairments with running, squatting, and sporting activities               [facility] PT Assessment - 06/23/19 1123      Assessment   Medical Diagnosis  CVA    Referring Provider (PT)  Ngwe Aycock    Onset Date/Surgical Date  02/01/17    Hand Dominance  Right    Next MD Visit  06/27/19    Prior Therapy  Yes at [facility]      Precautions   Precautions  None      Restrictions   Weight Bearing Restrictions  No      Balance Screen   Has the patient fallen in the past 6 months  No    Has the patient had a decrease in activity level because of a fear of falling?   No    Is the patient reluctant to leave their home because of a fear of falling?   No      Home Public house manager residence    Living Arrangements  Spouse/significant other    Available Help at Discharge  Family    Type of Home  House    Home Access  Stairs to enter    Entrance Stairs-Number of Steps  One    Entrance Stairs-Rails  Right;Left;Cannot reach both    Home Layout  One level      Prior Function   Level of Independence  Independent    Vocation  Full time employment    Gaffer  Works at [employer]  Ride fourwheelers, ATVs, basketball      Cognition   Overall Cognitive Status  Within Functional Limits for tasks assessed      Standardized Balance Assessment   Standardized Balance Assessment  Berg Balance Test;Dynamic Gait Index      Berg Balance Test   Sit to Stand  Able to stand without using hands and stabilize independently    Standing Unsupported  Able to stand safely 2 minutes    Sitting with Back Unsupported but Feet Supported on Floor or Stool  Able to sit safely and securely 2 minutes    Stand to Sit  Sits safely with minimal use of hands    Transfers  Able to transfer  safely, minor use of hands    Standing Unsupported with Eyes Closed  Able to stand 10 seconds safely    Standing Unsupported with Feet Together  Able to place feet together independently and stand 1 minute safely    From Standing, Reach Forward with Outstretched Arm  Can reach confidently >25 cm (10")    From Standing Position, Pick up Object from Floor  Able to pick up shoe, needs supervision    From Standing Position, Turn to Look  Behind Over each Shoulder  Looks behind from both sides and weight shifts well    Turn 360 Degrees  Able to turn 360 degrees safely in 4 seconds or less    Standing Unsupported, Alternately Place Feet on Step/Stool  Able to stand independently and safely and complete 8 steps in 20 seconds    Standing Unsupported, One Foot in Front  Able to plae foot ahead of the other independently and hold 30 seconds    Standing on One Leg  Tries to lift leg/unable to hold 3 seconds but remains standing independently    Total Score  51      Dynamic Gait Index   Level Surface  Normal    Change in Gait Speed  Normal    Gait with Horizontal Head Turns  Normal    Gait with Vertical Head Turns  Normal    Gait and Pivot Turn  Normal    Step Over Obstacle  Mild Impairment    Step Around Obstacles  Normal    Steps  Mild Impairment    Total Score  22                Objective measurements completed on examination: See above findings.              PT Education - 06/23/19 1125    Education Details  Plan of care    Person(s) Educated  Patient    Methods  Explanation    Comprehension  Verbalized understanding       PT Short Term Goals - 06/23/19 1712      PT SHORT TERM GOAL #1   Title  Pt will be independent with HEP in order to improve strength and balance in order to decrease fall risk and improve function at home and work.    Time  6    Period  Weeks    Status  New    Target Date  08/04/19        PT Long Term Goals - 06/23/19 1712      PT LONG  TERM GOAL #1   Title  Pt will increase LEFS by at least 9 points in order to demonstrate significant improvement in lower extremity function.    Baseline  06/23/19: 60/80    Time  12    Period  Weeks    Status  New    Target Date  09/15/19      PT LONG TERM GOAL #2   Title  Pt will improve BERG by at least 3 points in order to demonstrate clinically significant improvement in balance.    Baseline  06/23/19: 51/56    Time  12    Period  Weeks    Status  New    Target Date  09/15/19      PT LONG TERM GOAL #3   Title  Pt will increase strength of LLE to at least 3+/5 throughout in order to demonstrate improvement in strength and function    Baseline  06/23/19: LLE (out of 5) hip: IR: 2-, ER: 2+, abduction: 2+, adduction: 2+, knee: flexion: 3, ankle plantarflexion: 2+/5, ankle dorsiflexion: 2+/5    Time  12    Period  Weeks    Status  New    Target Date  09/15/19      PT LONG TERM GOAL #4   Title  Pt will increase self-selected 10MWT to greater than 1.0 m/s in order to demonstrate clinically significant improvement in  community ambulation.    Baseline  06/23/19: self-selected: 11.1s = 0.90 m/s;    Time  12    Period  Weeks    Status  New    Target Date  09/15/19             Plan - 06/23/19 1334    Clinical Impression Statement  Pt is a pleasant 43 year-old male referred for late effects of CVA. Pt arrives complaining of LLE and LUE weakness which are impacting his ability to work and participate in leisure activities. PT evaluation reveals significant weakness in LUE/LLE specifically L hip IR/ER, L hip abduction/adduction, L knee flexion, and L ankle plantarflexion/dorsiflexion. His self-selected gait speed is slightly below community norms and his balance is mildly impaired with BERG of 51/56. LEFS is 60/80. Pt would benefit from order for OT to focus on LUE weakness and fine motor control. Pt presents with deficits in strength, gait and balance. He will benefit from skilled PT  services to address these deficits.    Personal Factors and Comorbidities  Comorbidity 2;Past/Current Experience;Time since onset of injury/illness/exacerbation    Comorbidities  HTN, IDDM    Examination-Activity Limitations  Lift;Carry;Stand;Locomotion Level    Examination-Participation Restrictions  Community Activity;Shop;Yard Work    Conservation officer, historic buildings  Stable/Uncomplicated    Optometrist  Low    Rehab Potential  Fair    PT Frequency  2x / week    PT Duration  12 weeks    PT Treatment/Interventions  ADLs/Self Care Home Management;Aquatic Therapy;Biofeedback;Canalith Repostioning;Cryotherapy;Electrical Stimulation;Iontophoresis /ml Dexamethasone;Moist Heat;Traction;Ultrasound;DME Instruction;Gait training;Stair training;Functional mobility training;Therapeutic activities;Therapeutic exercise;Balance training;Neuromuscular re-education;Patient/family education;Manual techniques;Dry needling;Energy conservation;Vestibular;Spinal Manipulations;Joint Manipulations    PT Next Visit Plan  Initiate HEP, NMES, strengthening, L single leg balance    PT Home Exercise Plan  None currently, to be initiated at next session    Consulted and Agree with Plan of Care  Patient       Patient will benefit from skilled therapeutic intervention in order to improve the following deficits and impairments:  Abnormal gait, Decreased balance, Decreased strength  Visit Diagnosis: Chronic left shoulder pain  Muscle weakness (generalized)     Problem List Patient Active Problem List   Diagnosis Date Noted  . 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 Poorly controlled type 2 diabetes mellitus with peripheral neuropathy (HCC)   . Benign essential HTN   . Tobacco abuse   . Marijuana abuse   . Hyperlipidemia   . H/O medication noncompliance   . Acute CVA (cerebrovascular  accident) (HCC) 02/01/2017   Lynnea Maizes PT, DPT, GCS  Emaree Chiu 06/23/2019, 5:33 PM  [place] [facility] MAIN [facility] SERVICES [address] [place], [place], [address] Phone: [phone]   Fax:  [phone]  Name: Stanley Farley MRN: [id number] Date of Birth: 1976-03-27

## 2019-06-27 ENCOUNTER — Ambulatory Visit: Payer: Medicaid Other

## 2019-06-27 ENCOUNTER — Other Ambulatory Visit: Payer: Self-pay

## 2019-06-27 DIAGNOSIS — I6300 Cerebral infarction due to thrombosis of unspecified precerebral artery: Secondary | ICD-10-CM

## 2019-06-27 DIAGNOSIS — G8929 Other chronic pain: Secondary | ICD-10-CM

## 2019-06-27 DIAGNOSIS — M6281 Muscle weakness (generalized): Secondary | ICD-10-CM

## 2019-06-27 DIAGNOSIS — M25512 Pain in left shoulder: Secondary | ICD-10-CM | POA: Diagnosis not present

## 2019-06-27 NOTE — Therapy (Signed)
[place] MAIN [facility] SERVICES [address] [place], [place], [address] Phone: [phone]   Fax:  [phone]  Physical Therapy Treatment  Patient Details  Name: Stanley Farley MRN: [id number] Date of Birth: 1976/06/14 Referring Provider (PT): Tomasa Hose   Encounter Date: 06/27/2019  PT End of Session - 06/27/19 1732    Visit Number  2    Number of Visits  25    Date for PT Re-Evaluation  09/15/19    Authorization Type  eval: 06/23/19    PT Start Time  1646    PT Stop Time  1730    PT Time Calculation (min)  44 min    Equipment Utilized During Treatment  Gait belt    Activity Tolerance  Patient tolerated treatment well    Behavior During Therapy  WFL for tasks assessed/performed       Past Medical History:  Diagnosis Date  . Diabetes mellitus without complication ([place])   . Hypertension     Past Surgical History:  Procedure Laterality Date  . TEE WITHOUT CARDIOVERSION N/A 02/04/2017   Procedure: TRANSESOPHAGEAL ECHOCARDIOGRAM (TEE);  Surgeon: Minna Merritts, MD;  Location: [facility] ORS;  Service: Cardiovascular;  Laterality: N/A;    There were no vitals filed for this visit.  Subjective Assessment - 06/27/19 1731    Subjective  Patient presents for first treatment s/p eval. Reports one near fall falling to his knee in the rain. No pain today.    Pertinent History  Pt had a CVA 02/01/17 with LUE/LLE weakness. He was at [facility] for 3 days and then  in 7 days. He had HHPT for  1 month following the hospital stay. He came to [facility] for OP PT. He was referred back for additional therapy due to continued difficulty with LLE weakness. 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 He is struggling with gripping and lifting heavy boxes. He also reports hyperextension of L knee.    Patient Stated Goals  Improve his L knee and ankle strength, improve his LUE strength    Currently in Pain?   No/denies           Initiate HEP, NMES, strengthening, L single leg balance  patient wore df assist homemade brace   Airex pad: static stand with 6" step with one foot on airex pad one foot on step: 60 second holds with horizontal head turns. Challenging for LLE stability; x2 trials each LE, frequent LOB LLE  Resisted walking matrix machine: #12.5, x 2 trials each direction, 2x lateral,L, 2x lateral right, 2x forward, 2x backwards; cueing for decreasing velocity of movement and two near LOB due to limited LLE stabilized.   HEP: performed and demonstrated   Access Code: [email or web address]  URL: [email or web address]  Date: 06/27/2019  Prepared by: Janna Arch   Exercises Standing March with Unilateral Counter Support - 10 reps - 2 sets- 3 hold - 1x daily - 7x weekly Standing Hip Extension with Unilateral Counter Support - 10 reps - 2 sets - 5 hold - 1x daily - 7x weekly Standing Hip Abduction with Counter Support - 10 reps - 2 sets - 5 hold - 1x daily - 7x weekly Seated Ankle Alphabet - 1 reps - 2 sets - 5 hold - 1x daily - 7x weekly Sit to Stand with Arms Crossed - 10 reps - 2 sets - 5 hold - 1x daily - 7x weekly  Pt  educated throughout session about proper posture and technique with exercises. Improved exercise technique, movement at target joints, use of target muscles after min to mod verbal, visual, tactile cues.  Patient educated on and performed new HEP. He requires cueing for decreasing velocity to increase single limb stance demand. Left LE is challenged with single limb stability as well as coordination/spatial awareness. Patient will benefit from skilled PT to increase strength, stability, and improve gait mechanics.              PT Education - 06/27/19 1732    Education Details  exercise technique, body mechanics    Person(s) Educated  Patient    Methods  Explanation;Demonstration;Tactile cues;Verbal cues    Comprehension  Verbalized  understanding;Returned demonstration;Verbal cues required;Tactile cues required       PT Short Term Goals - 06/23/19 1712      PT SHORT TERM GOAL #1   Title  Pt will be independent with HEP in order to improve strength and balance in order to decrease fall risk and improve function at home and work.    Time  6    Period  Weeks    Status  New    Target Date  08/04/19        PT Long Term Goals - 06/23/19 1712      PT LONG TERM GOAL #1   Title  Pt will increase LEFS by at least 9 points in order to demonstrate significant improvement in lower extremity function.    Baseline  06/23/19: 60/80    Time  12    Period  Weeks    Status  New    Target Date  09/15/19      PT LONG TERM GOAL #2   Title  Pt will improve BERG by at least 3 points in order to demonstrate clinically significant improvement in balance.    Baseline  06/23/19: 51/56    Time  12    Period  Weeks    Status  New    Target Date  09/15/19      PT LONG TERM GOAL #3   Title  Pt will increase strength of LLE to at least 3+/5 throughout in order to demonstrate improvement in strength and function    Baseline  06/23/19: LLE (out of 5) hip: IR: 2-, ER: 2+, abduction: 2+, adduction: 2+, knee: flexion: 3, ankle plantarflexion: 2+/5, ankle dorsiflexion: 2+/5    Time  12    Period  Weeks    Status  New    Target Date  09/15/19      PT LONG TERM GOAL #4   Title  Pt will increase self-selected to greater than 1.0 m/s in order to demonstrate clinically significant improvement in community ambulation.    Baseline  06/23/19: self-selected: 11.1s = 0.90 m/s;    Time  12    Period  Weeks    Status  New    Target Date  09/15/19            Plan - 06/27/19 1734    Clinical Impression Statement  Patient educated on and performed new HEP. He requires cueing for decreasing velocity to increase single limb stance demand. Left LE is challenged with single limb stability as well as coordination/spatial awareness. Patient  will benefit from skilled PT to increase strength, stability, and improve gait mechanics.    Personal Factors and Comorbidities  Comorbidity 2;Past/Current Experience;Time since onset of injury/illness/exacerbation    Comorbidities  HTN,  IDDM    Examination-Activity Limitations  Lift;Carry;Stand;Locomotion Level    Examination-Participation Restrictions  Community Activity;Shop;Yard Work    Stability/Clinical Decision Making  Stable/Uncomplicated    Rehab Potential  Fair    PT Frequency  2x / week    PT Duration  12 weeks    PT Treatment/Interventions  ADLs/Self Care Home Management;Aquatic Therapy;Biofeedback;Canalith Repostioning;Cryotherapy;Electrical Stimulation;Iontophoresis 4mg /ml Dexamethasone;Moist Heat;Traction;Ultrasound;DME Instruction;Gait training;Stair training;Functional mobility training;Therapeutic activities;Therapeutic exercise;Balance training;Neuromuscular re-education;Patient/family education;Manual techniques;Dry needling;Energy conservation;Vestibular;Spinal Manipulations;Joint Manipulations    PT Next Visit Plan  Initiate HEP, NMES, strengthening, L single leg balance    PT Home Exercise Plan  None currently, to be initiated at next session    Consulted and Agree with Plan of Care  Patient       Patient will benefit from skilled therapeutic intervention in order to improve the following deficits and impairments:  Abnormal gait, Decreased balance, Decreased strength  Visit Diagnosis: Chronic left shoulder pain  Muscle weakness (generalized)  Cerebral infarction due to thrombosis of precerebral artery [facility](HCC)     Problem List Patient Active Problem List   Diagnosis Date Noted  . 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 Poorly controlled type 2 diabetes mellitus with peripheral neuropathy (HCC)   . Benign essential HTN   . Tobacco abuse   . Marijuana abuse   .  Hyperlipidemia   . H/O medication noncompliance   . Acute CVA (cerebrovascular accident) (HCC) 02/01/2017   Precious BardMarina Janaysia Mcleroy, PT, DPT   06/27/2019, 5:36 PM  [place] [facility] [address] [place], [place], [address] Phone: [phone]   Fax:  [phone]  Name: Stanley Farley MRN: [id number] Date of Birth: [birth year]

## 2019-06-29 ENCOUNTER — Ambulatory Visit: Payer: Medicaid Other

## 2019-07-04 ENCOUNTER — Ambulatory Visit: Payer: Medicaid Other

## 2019-07-04 ENCOUNTER — Other Ambulatory Visit: Payer: Self-pay

## 2019-07-04 DIAGNOSIS — M25512 Pain in left shoulder: Secondary | ICD-10-CM | POA: Diagnosis not present

## 2019-07-04 DIAGNOSIS — M6281 Muscle weakness (generalized): Secondary | ICD-10-CM

## 2019-07-04 DIAGNOSIS — I6300 Cerebral infarction due to thrombosis of unspecified precerebral artery: Secondary | ICD-10-CM

## 2019-07-04 DIAGNOSIS — G8929 Other chronic pain: Secondary | ICD-10-CM

## 2019-07-04 NOTE — Therapy (Signed)
[place] MAIN [facility] SERVICES [address] [place], [place], [address] Phone: [phone]   Fax:  [phone]  Physical Therapy Treatment  Patient Details  Name: Stanley Farley MRN: [id number] Date of Birth: [birth year] Referring Provider (PT): Tomasa Hose   Encounter Date: 07/04/2019  PT End of Session - 07/04/19 1654    Visit Number  3    Number of Visits  25    Date for PT Re-Evaluation  09/15/19    Authorization Type  eval: 06/23/19    PT Start Time  1607    PT Stop Time  1651    PT Time Calculation (min)  44 min    Equipment Utilized During Treatment  Gait belt    Activity Tolerance  Patient tolerated treatment well    Behavior During Therapy  WFL for tasks assessed/performed       Past Medical History:  Diagnosis Date  . Diabetes mellitus without complication ([place])   . Hypertension     Past Surgical History:  Procedure Laterality Date  . TEE WITHOUT CARDIOVERSION N/A 02/04/2017   Procedure: TRANSESOPHAGEAL ECHOCARDIOGRAM (TEE);  Surgeon: Minna Merritts, MD;  Location: [facility] ORS;  Service: Cardiovascular;  Laterality: N/A;    There were no vitals filed for this visit.  Subjective Assessment - 07/04/19 1620    Subjective  Patient reports no falls or LOB since last session. Has been compliant with HEP    Pertinent History  Pt had a CVA 02/01/17 with LUE/LLE weakness. He was at [facility] for 3 days and then [place] in 7 days. He had HHPT for  1 month following the hospital stay. He came to [facility] for OP PT. He was referred back for additional therapy due to continued difficulty with LLE weakness. 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 He is struggling with gripping and lifting heavy boxes. He also reports hyperextension of L knee.    Patient Stated Goals  Improve his L knee and ankle strength, improve his LUE strength    Currently in Pain?  No/denies         [facility] PT  Assessment - 07/04/19 0001      Standardized Balance Assessment   Standardized Balance Assessment  Berg Balance Test;Dynamic Gait Index      Berg Balance Test   Sit to Stand  Able to stand without using hands and stabilize independently    Standing Unsupported  Able to stand safely 2 minutes    Sitting with Back Unsupported but Feet Supported on Floor or Stool  Able to sit safely and securely 2 minutes    Stand to Sit  Sits safely with minimal use of hands    Transfers  Able to transfer safely, minor use of hands    Standing Unsupported with Eyes Closed  Able to stand 10 seconds safely    Standing Unsupported with Feet Together  Able to place feet together independently and stand 1 minute safely    From Standing, Reach Forward with Outstretched Arm  Can reach confidently >25 cm (10")    From Standing Position, Pick up Object from Floor  Able to pick up shoe, needs supervision    From Standing Position, Turn to Look Behind Over each Shoulder  Looks behind from both sides and weight shifts well    Turn 360 Degrees  Able to turn 360 degrees safely in 4 seconds or less  Standing Unsupported, Alternately Place Feet on Step/Stool  Able to stand independently and safely and complete 8 steps in 20 seconds    Standing Unsupported, One Foot in Front  Able to plae foot ahead of the other independently and hold 30 seconds    Standing on One Leg  Tries to lift leg/unable to hold 3 seconds but remains standing independently    Total Score  51      LEFS: 61/80 MMT: same as last session BERG: 51/56 10 MWT: 1.3 m/s     Treat Single LE leg press: LLE #60 focus on slow controlled eccentric/concentric portion 15x 2 sets;  Up with two into plantarflexion, down with one, very challenging with increased BUE support 20x  Toe taps cones: L and R  ; 8x 3 cones, very challenging for cross body coordination, frequent knocking over of cones with LLE due to limited coordination, improved with repetition.    Heel toe raises to emulate preswing/initial swing. 10x each side, very challenging to move LLE into dorsiflexion.   Seated RTB df against PT resistance 15x     Pt educated throughout session about proper posture and technique with exercises. Improved exercise technique, movement at target joints, use of target muscles after min to mod verbal, visual, tactile cues.                 PT Education - 07/04/19 1652    Education Details  exercise technique, body mechanics , goals    Person(s) Educated  Patient    Methods  Explanation;Demonstration;Tactile cues;Verbal cues    Comprehension  Verbalized understanding;Returned demonstration;Verbal cues required;Tactile cues required       PT Short Term Goals - 07/04/19 1702      PT SHORT TERM GOAL #1   Title  Pt will be independent with HEP in order to improve strength and balance in order to decrease fall risk and improve function at home and work.    Baseline  12/28: HEP compliant    Time  6    Period  Weeks    Status  Partially Met    Target Date  08/04/19        PT Long Term Goals - 07/04/19 1702      PT LONG TERM GOAL #1   Title  Pt will increase LEFS by at least 9 points in order to demonstrate significant improvement in lower extremity function.    Baseline  06/23/19: 60/80 12/28: 61/80    Time  12    Period  Weeks    Status  Partially Met    Target Date  09/15/19      PT LONG TERM GOAL #2   Title  Pt will improve BERG by at least 3 points in order to demonstrate clinically significant improvement in balance.    Baseline  06/23/19: 51/56 12/28: 51/56    Time  12    Period  Weeks    Status  Partially Met    Target Date  09/15/19      PT LONG TERM GOAL #3   Title  Pt will increase strength of LLE to at least 3+/5 throughout in order to demonstrate improvement in strength and function    Baseline  06/23/19: LLE (out of 5) hip: IR: 2-, ER: 2+, abduction: 2+, adduction: 2+, knee: flexion: 3, ankle  plantarflexion: 2+/5, ankle dorsiflexion: 2+/5    Time  12    Period  Weeks    Status  Partially Met  Target Date  09/15/19      PT LONG TERM GOAL #4   Title  Pt will increase self-selected 10MWT to greater than 1.0 m/s in order to demonstrate clinically significant improvement in community ambulation.    Baseline  06/23/19: self-selected: 11.1s = 0.90 m/s; 12/28: 1.3 m/s    Time  12    Period  Weeks    Status  Achieved            Plan - 07/04/19 1705    Clinical Impression Statement  Patient arrived late to session, held afterwards to allow for full session allotment. Patient's goals performed due to being end of the year however this is only patient's second treatment session.  Progression of single limb stability and strength interventions performed with patient demonstrating excellent motivation. Patient will benefit from skilled PT to increase strength, stability, and improve gait mechanics.    Personal Factors and Comorbidities  Comorbidity 2;Past/Current Experience;Time since onset of injury/illness/exacerbation    Comorbidities  HTN, IDDM    Examination-Activity Limitations  Lift;Carry;Stand;Locomotion Level    Examination-Participation Restrictions  Community Activity;Shop;Yard Work    Stability/Clinical Decision Making  Stable/Uncomplicated    Rehab Potential  Fair    PT Frequency  2x / week    PT Duration  12 weeks    PT Treatment/Interventions  ADLs/Self Care Home Management;Aquatic Therapy;Biofeedback;Canalith Repostioning;Cryotherapy;Electrical Stimulation;Iontophoresis 33m/ml Dexamethasone;Moist Heat;Traction;Ultrasound;DME Instruction;Gait training;Stair training;Functional mobility training;Therapeutic activities;Therapeutic exercise;Balance training;Neuromuscular re-education;Patient/family education;Manual techniques;Dry needling;Energy conservation;Vestibular;Spinal Manipulations;Joint Manipulations    PT Next Visit Plan  Initiate HEP, NMES, strengthening, L  single leg balance    PT Home Exercise Plan  None currently, to be initiated at next session    Consulted and Agree with Plan of Care  Patient       Patient will benefit from skilled therapeutic intervention in order to improve the following deficits and impairments:  Abnormal gait, Decreased balance, Decreased strength  Visit Diagnosis: Chronic left shoulder pain  Muscle weakness (generalized)  Cerebral infarction due to thrombosis of precerebral artery ([facility]     Problem List Patient Active Problem List   Diagnosis Date Noted  . Thrombotic cerebral infarction ([place] 02/04/2017  . 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 Poorly controlled type 2 diabetes mellitus with peripheral neuropathy ([place]   . Benign essential HTN   . Tobacco abuse   . Marijuana abuse   . Hyperlipidemia   . H/O medication noncompliance   . Acute CVA (cerebrovascular accident) ([place] 02/01/2017   MJanna Arch PT, DPT   07/04/2019, 5:09 PM  [place] [facility] [address] [address]Phone: [phone]  Fax:  [phone] Name: Stanley SHIRAISHIMRN: [id number] of Birth: [birth year]

## 2019-07-18 ENCOUNTER — Other Ambulatory Visit: Payer: Self-pay

## 2019-07-18 ENCOUNTER — Ambulatory Visit: Payer: Medicaid Other | Attending: Family Medicine

## 2019-07-18 DIAGNOSIS — M25512 Pain in left shoulder: Secondary | ICD-10-CM | POA: Diagnosis present

## 2019-07-18 DIAGNOSIS — I6300 Cerebral infarction due to thrombosis of unspecified precerebral artery: Secondary | ICD-10-CM | POA: Insufficient documentation

## 2019-07-18 DIAGNOSIS — G8929 Other chronic pain: Secondary | ICD-10-CM | POA: Diagnosis present

## 2019-07-18 DIAGNOSIS — M6281 Muscle weakness (generalized): Secondary | ICD-10-CM | POA: Diagnosis present

## 2019-07-18 NOTE — Therapy (Signed)
[place] MAIN [facility] SERVICES [address] [place], [place], [address] Phone: [phone]   Fax:  [phone]  Physical Therapy Treatment  Patient Details  Name: Stanley Farley MRN: [id number] Date of Birth: [birth year] Referring Provider (PT): Tomasa Hose   Encounter Date: 07/18/2019  PT End of Session - 07/18/19 1703    Visit Number  4    Number of Visits  25    Date for PT Re-Evaluation  09/15/19    Authorization Type  eval: 06/23/19    PT Start Time  1619    PT Stop Time  1649    PT Time Calculation (min)  30 min    Equipment Utilized During Treatment  Gait belt    Activity Tolerance  Patient tolerated treatment well    Behavior During Therapy  WFL for tasks assessed/performed       Past Medical History:  Diagnosis Date  . Diabetes mellitus without complication ([place])   . Hypertension     Past Surgical History:  Procedure Laterality Date  . TEE WITHOUT CARDIOVERSION N/A 02/04/2017   Procedure: TRANSESOPHAGEAL ECHOCARDIOGRAM (TEE);  Surgeon: Minna Merritts, MD;  Location: [facility] ORS;  Service: Cardiovascular;  Laterality: N/A;    There were no vitals filed for this visit.  Subjective Assessment - 07/18/19 1702    Subjective  Patient arrived late limiting session duration. Patient reports no pain, has been compliant with HEP.    Pertinent History  Pt had a CVA 02/01/17 with LUE/LLE weakness. He was at [facility] for 3 days and then [place] in 7 days. He had HHPT for  1 month following the hospital stay. He came to [facility] for OP PT. He was referred back for additional therapy due to continued difficulty with LLE weakness. 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 He is struggling with gripping and lifting heavy boxes. He also reports hyperextension of L knee.    Patient Stated Goals  Improve his L knee and ankle strength, improve his LUE strength    Currently in Pain?  No/denies                Treat Single LE leg press: LLE #60 focus on slow controlled eccentric/concentric portion 15x 2 sets second set #75 lb    Lateral step over wooden step : 10x each side, cueing for spatial awareness to let each foot step over without touching the box  Curtsy lunge with lateral abduction toe tap 15x each side, cueing for body mechanics and sequencing; occasional LOB  Sit to stand LLE only; right foot on step; raised plinth table, focus on eccentric control due to "plop". 15x  4lb ankle weight:  -seated LAQ 12x; excessive posterior trunk lean due to hamstring limitations -seated straight leg abduction/adduction 12x -seated bicycle kick 15x each LE      Pt educated throughout session about proper posture and technique with exercises. Improved exercise technique, movement at target joints, use of target muscles after min to mod verbal, visual, tactile cues.                  PT Education - 07/18/19 1702    Education Details  exercise technique, body mechanics,    Person(s) Educated  Patient    Methods  Explanation;Demonstration;Tactile cues;Verbal cues    Comprehension  Verbalized understanding;Returned demonstration;Verbal cues required;Tactile cues required       PT Short Term Goals -  07/04/19 1702      PT SHORT TERM GOAL #1   Title  Pt will be independent with HEP in order to improve strength and balance in order to decrease fall risk and improve function at home and work.    Baseline  12/28: HEP compliant    Time  6    Period  Weeks    Status  Partially Met    Target Date  08/04/19        PT Long Term Goals - 07/04/19 1702      PT LONG TERM GOAL #1   Title  Pt will increase LEFS by at least 9 points in order to demonstrate significant improvement in lower extremity function.    Baseline  06/23/19: 60/80 12/28: 61/80    Time  12    Period  Weeks    Status  Partially Met    Target Date  09/15/19      PT LONG TERM GOAL #2   Title  Pt  will improve BERG by at least 3 points in order to demonstrate clinically significant improvement in balance.    Baseline  06/23/19: 51/56 12/28: 51/56    Time  12    Period  Weeks    Status  Partially Met    Target Date  09/15/19      PT LONG TERM GOAL #3   Title  Pt will increase strength of LLE to at least 3+/5 throughout in order to demonstrate improvement in strength and function    Baseline  06/23/19: LLE (out of 5) hip: IR: 2-, ER: 2+, abduction: 2+, adduction: 2+, knee: flexion: 3, ankle plantarflexion: 2+/5, ankle dorsiflexion: 2+/5    Time  12    Period  Weeks    Status  Partially Met    Target Date  09/15/19      PT LONG TERM GOAL #4   Title  Pt will increase self-selected 10MWT to greater than 1.0 m/s in order to demonstrate clinically significant improvement in community ambulation.    Baseline  06/23/19: self-selected: 11.1s = 0.90 m/s; 12/28: 1.3 m/s    Time  12    Period  Weeks    Status  Achieved            Plan - 07/18/19 1704    Clinical Impression Statement  Patient arrived late limiting session duration. He remains highly motivated during session and is eager to progress LLE strength and stability. Single limb stance is challenging on LLE and patient has limited control of eccentric portion of strengthening interventions. Patient will benefit from skilled PT to increase strength, stability, and improve gait mechanics.    Personal Factors and Comorbidities  Comorbidity 2;Past/Current Experience;Time since onset of injury/illness/exacerbation    Comorbidities  HTN, IDDM    Examination-Activity Limitations  Lift;Carry;Stand;Locomotion Level    Examination-Participation Restrictions  Community Activity;Shop;Yard Work    Stability/Clinical Decision Making  Stable/Uncomplicated    Rehab Potential  Fair    PT Frequency  2x / week    PT Duration  12 weeks    PT Treatment/Interventions  ADLs/Self Care Home Management;Aquatic Therapy;Biofeedback;Canalith  Repostioning;Cryotherapy;Electrical Stimulation;Iontophoresis 64m/ml Dexamethasone;Moist Heat;Traction;Ultrasound;DME Instruction;Gait training;Stair training;Functional mobility training;Therapeutic activities;Therapeutic exercise;Balance training;Neuromuscular re-education;Patient/family education;Manual techniques;Dry needling;Energy conservation;Vestibular;Spinal Manipulations;Joint Manipulations    PT Next Visit Plan  Initiate HEP, NMES, strengthening, L single leg balance    PT Home Exercise Plan  None currently, to be initiated at next session    Consulted and Agree with Plan of Care  Patient       Patient will benefit from skilled therapeutic intervention in order to improve the following deficits and impairments:  Abnormal gait, Decreased balance, Decreased strength  Visit Diagnosis: Chronic left shoulder pain  Muscle weakness (generalized)  Cerebral infarction due to thrombosis of precerebral artery [facility])     Problem List Patient Active Problem List   Diagnosis Date Noted  . Thrombotic cerebral infarction ([place]) 02/04/2017  . 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 Poorly controlled type 2 diabetes mellitus with peripheral neuropathy ([place])   . Benign essential HTN   . Tobacco abuse   . Marijuana abuse   . Hyperlipidemia   . H/O medication noncompliance   . Acute CVA (cerebrovascular accident) ([place]) 02/01/2017   Janna Arch, PT, DPT   07/18/2019, 5:08 PM  [place] MAIN [facility] SERVICES [address] [place], [place], [address] Phone: [phone]   Fax:  [phone]  Name: MENNO VANBERGEN MRN: [id number] Date of Birth: 1975/11/29

## 2019-07-25 ENCOUNTER — Ambulatory Visit: Payer: Medicaid Other

## 2019-07-25 ENCOUNTER — Other Ambulatory Visit: Payer: Self-pay

## 2019-07-25 DIAGNOSIS — M25512 Pain in left shoulder: Secondary | ICD-10-CM

## 2019-07-25 DIAGNOSIS — G8929 Other chronic pain: Secondary | ICD-10-CM

## 2019-07-25 DIAGNOSIS — M6281 Muscle weakness (generalized): Secondary | ICD-10-CM

## 2019-07-25 NOTE — Therapy (Signed)
[place] MAIN [facility] SERVICES [address] [place], [place], [address] Phone: [phone]   Fax:  [phone]  Physical Therapy Treatment  Patient Details  Name: Stanley Farley MRN: [id number] Date of Birth: [birth year] Referring Provider (PT): Tomasa Hose   Encounter Date: 07/25/2019  PT End of Session - 07/25/19 1634    Visit Number  5    Number of Visits  25    Date for PT Re-Evaluation  09/15/19    Authorization Type  eval: 06/23/19    PT Start Time  1614    PT Stop Time  1645    PT Time Calculation (min)  31 min    Activity Tolerance  Patient tolerated treatment well    Behavior During Therapy  [facility] for tasks assessed/performed       Past Medical History:  Diagnosis Date  . Diabetes mellitus without complication ([place])   . Hypertension     Past Surgical History:  Procedure Laterality Date  . TEE WITHOUT CARDIOVERSION N/A 02/04/2017   Procedure: TRANSESOPHAGEAL ECHOCARDIOGRAM (TEE);  Surgeon: Minna Merritts, MD;  Location: [facility] ORS;  Service: Cardiovascular;  Laterality: N/A;    There were no vitals filed for this visit.  Subjective Assessment - 07/25/19 1631    Subjective  Patient arrivaed late limiting session duration. Pt without complaint, stated he is doing well.    Pertinent History  Pt had a CVA 02/01/17 with LUE/LLE weakness. He was at [facility] for 3 days and then [place] in 7 days. He had HHPT for  1 month following the hospital stay. He came to [facility] for OP PT. He was referred back for additional therapy due to continued difficulty with LLE weakness. 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 He is struggling with gripping and lifting heavy boxes. He also reports hyperextension of L knee.    Patient Stated Goals  Improve his L knee and ankle strength, improve his LUE strength        Treatment: Single LE leg press: LLE #73 (in hospital gym)  focus on slow controlled  eccentric/concentric portion 2x12 Leg press single calf raise 53# 2x12 Standing hip extension bilaterally x10 with bilateral UE support cues for form/activity pacting Standing hip extension bilaterally x10 with bilateral UE support cues for form Standing hip flexion with hip extension and toe tap x10 bilaterally single UE support cues for coordination and muscle control  sit to stands with RLE posterior to LLE to encourage more weight bearing. Elevated plinth needed, pt unable to perform from standard height. X12. Pt with most difficulty with eccentric control of motion.      PT Education - 07/25/19 1632    Education Details  exercise technique, body mechanics    Person(s) Educated  Patient    Methods  Explanation;Demonstration;Tactile cues;Verbal cues    Comprehension  Verbalized understanding;Returned demonstration;Verbal cues required;Tactile cues required;Need further instruction       PT Short Term Goals - 07/04/19 1702      PT SHORT TERM GOAL #1   Title  Pt will be independent with HEP in order to improve strength and balance in order to decrease fall risk and improve function at home and work.    Baseline  12/28: HEP compliant    Time  6    Period  Weeks    Status  Partially Met    Target Date  08/04/19  PT Long Term Goals - 07/04/19 1702      PT LONG TERM GOAL #1   Title  Pt will increase LEFS by at least 9 points in order to demonstrate significant improvement in lower extremity function.    Baseline  06/23/19: 60/80 12/28: 61/80    Time  12    Period  Weeks    Status  Partially Met    Target Date  09/15/19      PT LONG TERM GOAL #2   Title  Pt will improve BERG by at least 3 points in order to demonstrate clinically significant improvement in balance.    Baseline  06/23/19: 51/56 12/28: 51/56    Time  12    Period  Weeks    Status  Partially Met    Target Date  09/15/19      PT LONG TERM GOAL #3   Title  Pt will increase strength of LLE to at least  3+/5 throughout in order to demonstrate improvement in strength and function    Baseline  06/23/19: LLE (out of 5) hip: IR: 2-, ER: 2+, abduction: 2+, adduction: 2+, knee: flexion: 3, ankle plantarflexion: 2+/5, ankle dorsiflexion: 2+/5    Time  12    Period  Weeks    Status  Partially Met    Target Date  09/15/19      PT LONG TERM GOAL #4   Title  Pt will increase self-selected 10MWT to greater than 1.0 m/s in order to demonstrate clinically significant improvement in community ambulation.    Baseline  06/23/19: self-selected: 11.1s = 0.90 m/s; 12/28: 1.3 m/s    Time  12    Period  Weeks    Status  Achieved            Plan - 07/25/19 1633    Clinical Impression Statement  Patient arrived late to session limiting duration. The patient was challenged by calf raises on leg press this session, as well as single limb activities. The patient would benefit from further skilled PT intervention to maximize strength and progress towards goals.    Personal Factors and Comorbidities  Comorbidity 2;Past/Current Experience;Time since onset of injury/illness/exacerbation    Comorbidities  HTN, IDDM    Examination-Activity Limitations  Lift;Carry;Stand;Locomotion Level    Examination-Participation Restrictions  Community Activity;Shop;Yard Work    Stability/Clinical Decision Making  Stable/Uncomplicated    Rehab Potential  Fair    PT Frequency  2x / week    PT Duration  12 weeks    PT Treatment/Interventions  ADLs/Self Care Home Management;Aquatic Therapy;Biofeedback;Canalith Repostioning;Cryotherapy;Electrical Stimulation;Iontophoresis 67m/ml Dexamethasone;Moist Heat;Traction;Ultrasound;DME Instruction;Gait training;Stair training;Functional mobility training;Therapeutic activities;Therapeutic exercise;Balance training;Neuromuscular re-education;Patient/family education;Manual techniques;Dry needling;Energy conservation;Vestibular;Spinal Manipulations;Joint Manipulations    PT Next Visit Plan   Initiate HEP, NMES, strengthening, L single leg balance    PT Home Exercise Plan  None currently, to be initiated at next session    Consulted and Agree with Plan of Care  Patient       Patient will benefit from skilled therapeutic intervention in order to improve the following deficits and impairments:  Abnormal gait, Decreased balance, Decreased strength  Visit Diagnosis: Chronic left shoulder pain  Muscle weakness (generalized)     Problem List Patient Active Problem List   Diagnosis Date Noted  . Thrombotic cerebral infarction ([place] 02/04/2017  . 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 Poorly controlled type 2 diabetes mellitus with peripheral neuropathy ([place]   .  Benign essential HTN   . Tobacco abuse   . Marijuana abuse   . Hyperlipidemia   . H/O medication noncompliance   . Acute CVA (cerebrovascular accident) ([place]) 02/01/2017    Lieutenant Diego PT, DPT 9:14 AM,07/26/19   [place] MAIN [facility] SERVICES [address] [place], [place], [address] Phone: [phone]   Fax:  [phone]  Name: Stanley Farley MRN: [id number] Date of Birth: [birth year]

## 2019-08-01 ENCOUNTER — Ambulatory Visit: Payer: Medicaid Other

## 2019-08-08 ENCOUNTER — Other Ambulatory Visit: Payer: Self-pay

## 2019-08-08 ENCOUNTER — Ambulatory Visit: Payer: Medicaid Other | Attending: Family Medicine

## 2019-08-08 DIAGNOSIS — G8929 Other chronic pain: Secondary | ICD-10-CM | POA: Insufficient documentation

## 2019-08-08 DIAGNOSIS — M6281 Muscle weakness (generalized): Secondary | ICD-10-CM | POA: Diagnosis present

## 2019-08-08 DIAGNOSIS — I69354 Hemiplegia and hemiparesis following cerebral infarction affecting left non-dominant side: Secondary | ICD-10-CM | POA: Insufficient documentation

## 2019-08-08 DIAGNOSIS — M25512 Pain in left shoulder: Secondary | ICD-10-CM | POA: Insufficient documentation

## 2019-08-08 DIAGNOSIS — R278 Other lack of coordination: Secondary | ICD-10-CM | POA: Diagnosis present

## 2019-08-08 DIAGNOSIS — I6300 Cerebral infarction due to thrombosis of unspecified precerebral artery: Secondary | ICD-10-CM | POA: Insufficient documentation

## 2019-08-08 NOTE — Therapy (Signed)
[place] MAIN [facility] SERVICES [address] [place], [place], [address] Phone: [phone]   Fax:  [phone]  Physical Therapy Treatment  Patient Details  Name: Stanley Farley MRN: [id number] Date of Birth: 15-Sep-1975 Referring Provider (PT): Tomasa Hose   Encounter Date: 08/08/2019  PT End of Session - 08/08/19 1610    Visit Number  6    Number of Visits  25    Date for PT Re-Evaluation  09/15/19    Authorization Type  eval: 06/23/19    PT Start Time  1601    PT Stop Time  1644    PT Time Calculation (min)  43 min    Equipment Utilized During Treatment  Gait belt    Activity Tolerance  Patient tolerated treatment well    Behavior During Therapy  WFL for tasks assessed/performed       Past Medical History:  Diagnosis Date  . Diabetes mellitus without complication ([place])   . Hypertension     Past Surgical History:  Procedure Laterality Date  . TEE WITHOUT CARDIOVERSION N/A 02/04/2017   Procedure: TRANSESOPHAGEAL ECHOCARDIOGRAM (TEE);  Surgeon: Minna Merritts, MD;  Location: [facility] ORS;  Service: Cardiovascular;  Laterality: N/A;    There were no vitals filed for this visit.  Subjective Assessment - 08/08/19 1655    Subjective  Patient reports compliance with HEP, wants to start going back to the gym but is nervous with COVID. Has been doing his exercises daily at work. Starts OT next week.    Pertinent History  Pt had a CVA 02/01/17 with LUE/LLE weakness. He was at [facility] for 3 days and then [place] in 7 days. He had HHPT for  1 month following the hospital stay. He came to [facility] for OP PT. He was referred back for additional therapy due to continued difficulty with LLE weakness. 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 He is struggling with gripping and lifting heavy boxes. He also reports hyperextension of L knee.    Patient Stated Goals  Improve his L knee and ankle strength,  improve his LUE strength    Currently in Pain?  No/denies       Starts OT next Monday.   [facility] PT Assessment - 08/08/19 0001      Berg Balance Test   Sit to Stand  Able to stand without using hands and stabilize independently    Standing Unsupported  Able to stand safely 2 minutes    Sitting with Back Unsupported but Feet Supported on Floor or Stool  Able to sit safely and securely 2 minutes    Stand to Sit  Sits safely with minimal use of hands    Transfers  Able to transfer safely, minor use of hands    Standing Unsupported with Eyes Closed  Able to stand 10 seconds safely    Standing Unsupported with Feet Together  Able to place feet together independently and stand 1 minute safely    From Standing, Reach Forward with Outstretched Arm  Can reach confidently >25 cm (10")    From Standing Position, Pick up Object from Floor  Able to pick up shoe safely and easily    From Standing Position, Turn to Look Behind Over each Shoulder  Looks behind from both sides and weight shifts well    Turn 360 Degrees  Able to turn 360 degrees safely in 4 seconds or less  Standing Unsupported, Alternately Place Feet on Step/Stool  Able to stand independently and safely and complete 8 steps in 20 seconds    Standing Unsupported, One Foot in Front  Able to plae foot ahead of the other independently and hold 30 seconds    Standing on One Leg  Able to lift leg independently and hold equal to or more than 3 seconds    Total Score  53      Goals  LEFS: 63/80 BERG: 53/56  6 minute walk test: 954 ft   In hospital gym:  Bilateral concentric unilateral eccentric knee extension alternating; 15x each LE 3 plates Bilateral concentric unilateral eccentric hamstring curl 10x each LE 2 plates Single LE hamstring curl LLE, 1 plate 8x; 2 sets   Seated (added to HEP) dyna disc: df/pf 15x dyna disc clockwise circles 10x , counterclockwise 10x.    Pt educated throughout session about proper posture and technique  with exercises. Improved exercise technique, movement at target joints, use of target muscles after min to mod verbal, visual, tactile cues.                   PT Education - 08/08/19 1655    Education Details  goals, ankle control, body mechanics    Person(s) Educated  Patient    Methods  Explanation;Demonstration;Tactile cues;Verbal cues    Comprehension  Verbalized understanding;Returned demonstration;Verbal cues required;Tactile cues required       PT Short Term Goals - 07/04/19 1702      PT SHORT TERM GOAL #1   Title  Pt will be independent with HEP in order to improve strength and balance in order to decrease fall risk and improve function at home and work.    Baseline  12/28: HEP compliant    Time  6    Period  Weeks    Status  Partially Met    Target Date  08/04/19        PT Long Term Goals - 08/08/19 1652      PT LONG TERM GOAL #1   Title  Pt will increase LEFS by at least 9 points in order to demonstrate significant improvement in lower extremity function.    Baseline  06/23/19: 60/80 12/28: 61/80 2/1: 63/80    Time  12    Period  Weeks    Status  Partially Met    Target Date  09/15/19      PT LONG TERM GOAL #2   Title  Pt will improve BERG by at least 3 points in order to demonstrate clinically significant improvement in balance.    Baseline  06/23/19: 51/56 12/28: 51/56 2/1: 53/56    Time  12    Period  Weeks    Status  Partially Met    Target Date  09/15/19      PT LONG TERM GOAL #3   Title  Pt will increase strength of LLE to at least 3+/5 throughout in order to demonstrate improvement in strength and function    Baseline  06/23/19: LLE (out of 5) hip: IR: 2-, ER: 2+, abduction: 2+, adduction: 2+, knee: flexion: 3, ankle plantarflexion: 2+/5, ankle dorsiflexion: 2+/5    Time  12    Period  Weeks    Status  Partially Met    Target Date  09/15/19      PT LONG TERM GOAL #4   Title  Pt will increase self-selected 10MWT to greater than 1.0 m/s  in order to demonstrate  clinically significant improvement in community ambulation.    Baseline  06/23/19: self-selected: 11.1s = 0.90 m/s; 12/28: 1.3 m/s    Time  12    Period  Weeks    Status  Achieved      PT LONG TERM GOAL #5   Title  Patient will increase six minute walk test distance to >1500 for progression to community age norm ambulator and improve gait mechanics with decreased episodes of L drop foot    Baseline  2/1: 954 ft with frequent episodes of L drop foot    Time  8    Period  Weeks    Status  New    Target Date  09/15/19            Plan - 08/08/19 1658    Clinical Impression Statement  Patient is progressing with functional stability and mobility with increase in BERG score of 3 points and performance of 6 minute walk test with frequent episodes of L foot drop. Use of gym equipment performed with good body mechanics and need for occasional lateral stabilization of knee/ankle for stability.Patient will benefit from skilled PT to increase strength, stability, and improve gait mechanics.    Personal Factors and Comorbidities  Comorbidity 2;Past/Current Experience;Time since onset of injury/illness/exacerbation    Comorbidities  HTN, IDDM    Examination-Activity Limitations  Lift;Carry;Stand;Locomotion Level    Examination-Participation Restrictions  Community Activity;Shop;Yard Work    Stability/Clinical Decision Making  Stable/Uncomplicated    Rehab Potential  Fair    PT Frequency  2x / week    PT Duration  12 weeks    PT Treatment/Interventions  ADLs/Self Care Home Management;Aquatic Therapy;Biofeedback;Canalith Repostioning;Cryotherapy;Electrical Stimulation;Iontophoresis 75m/ml Dexamethasone;Moist Heat;Traction;Ultrasound;DME Instruction;Gait training;Stair training;Functional mobility training;Therapeutic activities;Therapeutic exercise;Balance training;Neuromuscular re-education;Patient/family education;Manual techniques;Dry needling;Energy  conservation;Vestibular;Spinal Manipulations;Joint Manipulations    PT Next Visit Plan  Initiate HEP, NMES, strengthening, L single leg balance    PT Home Exercise Plan  None currently, to be initiated at next session    Consulted and Agree with Plan of Care  Patient       Patient will benefit from skilled therapeutic intervention in order to improve the following deficits and impairments:  Abnormal gait, Decreased balance, Decreased strength  Visit Diagnosis: Chronic left shoulder pain  Muscle weakness (generalized)  Cerebral infarction due to thrombosis of precerebral artery ([facility]     Problem List Patient Active Problem List   Diagnosis Date Noted  . Thrombotic cerebral infarction ([place] 02/04/2017  . 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 Poorly controlled type 2 diabetes mellitus with peripheral neuropathy ([place]   . Benign essential HTN   . Tobacco abuse   . Marijuana abuse   . Hyperlipidemia   . H/O medication noncompliance   . Acute [place]bro[place]) [place] 02/01/2017    MJanna Arch PT, DPT   08/08/2019, 5:00 PM  [place] [place] [facility] [address] [address]Phone: [phone]  Fax:  [phone] Name: Stanley IHNENMRN: [id number] of Birth: 8Dec 20, 1977

## 2019-08-15 ENCOUNTER — Ambulatory Visit: Payer: Medicaid Other | Admitting: Occupational Therapy

## 2019-08-15 ENCOUNTER — Encounter: Payer: Self-pay | Admitting: Occupational Therapy

## 2019-08-15 ENCOUNTER — Other Ambulatory Visit: Payer: Self-pay

## 2019-08-15 DIAGNOSIS — M6281 Muscle weakness (generalized): Secondary | ICD-10-CM

## 2019-08-15 DIAGNOSIS — M25512 Pain in left shoulder: Secondary | ICD-10-CM | POA: Diagnosis not present

## 2019-08-15 DIAGNOSIS — I69354 Hemiplegia and hemiparesis following cerebral infarction affecting left non-dominant side: Secondary | ICD-10-CM

## 2019-08-15 DIAGNOSIS — R278 Other lack of coordination: Secondary | ICD-10-CM

## 2019-08-16 NOTE — Therapy (Signed)
[place] [facility] MAIN [facility] SERVICES [address] [place], [place], [address] Phone: [phone]   Fax:  [phone]  Occupational Therapy Evaluation  Patient Details  Name: MERL BOMMARITO MRN: [id number] Date of Birth: [birth year] No data recorded  Encounter Date: 08/15/2019  OT End of Session - 08/16/19 1820    Visit Number  1    Number of Visits  13    Date for OT Re-Evaluation  11/07/19    Authorization Type  Medicaid    OT Start Time  1103    OT Stop Time  1200    OT Time Calculation (min)  57 min    Activity Tolerance  Patient tolerated treatment well    Behavior During Therapy  [facility] for tasks assessed/performed       Past Medical History:  Diagnosis Date  . Diabetes mellitus without complication ([facility])   . Hypertension     Past Surgical History:  Procedure Laterality Date  . TEE WITHOUT CARDIOVERSION N/A 02/04/2017   Procedure: TRANSESOPHAGEAL ECHOCARDIOGRAM (TEE);  Surgeon: Antonieta Iba, MD;  Location: [facility] ORS;  Service: Cardiovascular;  Laterality: N/A;    There were no vitals filed for this visit.  Subjective Assessment - 08/15/19 1108    Subjective   Patient reports he doesn't usually have pain.  Patient reports he had a stroke 2 years ago, left arm with decreased strength and tightness.  Reports he has been working for the last year.  Had PT and OT in the past.    Patient Stated Goals  Patient would like to ride his motorcycle again, get stronger in the left arm.    Currently in Pain?  No/denies    Multiple Pain Sites  No        [facility] OT Assessment - 08/15/19 1111      Assessment   Medical Diagnosis  CVA    Onset Date/Surgical Date  02/01/17    Hand Dominance  Right    Next MD Visit  06/27/19    Prior Therapy  Yes at [facility]      Precautions   Precautions  None      Restrictions   Weight Bearing Restrictions  No      Balance Screen   Has the patient fallen in the past 6 months  No    Has the patient had a  decrease in activity level because of a fear of falling?   No    Is the patient reluctant to leave their home because of a fear of falling?   No      Home  Environment   Family/patient expects to be discharged to:  Private residence    Living Arrangements  Spouse/significant other    Available Help at Discharge  Family    Type of Home  House    Home Layout  One level    Alternate Level Stairs - Number of Steps  13 steps to enter    [employer];Door    Hydrographic surveyor    Lives With  Spouse;Son      Prior Function   Level of Independence  Independent    Vocation  Full time employment    Vocation Requirements  Works at [employer]  Ride fourwheelers, ATVs, basketball      ADL   Eating/Feeding  Other (comment )   difficulty with cutting meat   Grooming  Independent    Upper  Body Bathing  Modified independent    Lower Body Bathing  Modified independent    Upper Body Dressing  Independent    Lower Body Dressing  Modified independent    Toilet Transfer  Modified independent    Toileting - Clothing Manipulation  Modified independent    Tub/Shower Transfer  Modified independent    ADL comments  Patient requires increased time to complete all self care tasks, has difficulty with cutting meat.  He has implemented compensatory strategies for self care tasks with buttons, snaps and zippers using primarily right dominant hand.  He demonstrates difficulty with lifting heavier boxes greater than 20# as well as lifting to place on shelves at work.  Difficulty picking up and managing full cases of oil.  He demonstrates difficulty with tool use requiring left hand or bilateral hand use especially tools which require precision movements.        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      Written Expression   Dominant Hand  Right      Vision - History   Additional Comments  Patient denies any visual changes since CVA      Cognition   Overall Cognitive Status  Within Functional Limits for tasks assessed      Sensation   Light Touch  Appears Intact    Stereognosis  Appears Intact    Hot/Cold  Appears Intact    Proprioception  Appears Intact      Coordination   Gross Motor Movements are Fluid and Coordinated  No    Fine Motor Movements are Fluid and Coordinated  No    Finger Nose Finger Test  impaired    9 Hole Peg Test  Right;Left    Right 9 Hole Peg Test  22    Left 9 Hole Peg Test  2 min 8 sec    Coordination  impaired      ROM / Strength   AROM / PROM / Strength  AROM;Strength      AROM   Overall AROM   Deficits    Overall AROM Comments  Shoulder flexion to 121 degrees, shoulder ABD to 134 degrees, elbow flexion to 130 degrees, elbow extension -30 degrees.  Supination to 65 degrees, tends to keep left forearm in pronated position.  Wrist extension 55 degrees, flexion 60 degrees. See below in note for details of finger ROM      Strength   Overall Strength  Deficits    Overall  Strength Comments  RUE WNLS, LUE shoulder flexion 4-/5, ABD 4-/5, elbow extension 4/5.        Hand Function   Right Hand Grip (lbs)  110    Right Hand Lateral Pinch  26 lbs    Right Hand 3 Point Pinch  22 lbs    Left Hand Grip (lbs)  75    Left Hand Lateral Pinch  22 lbs    Left 3 point pinch  12 lbs    Comment  2 point pinch R 13, L 10#        Finger ROM as follows: Left UE MP full flexion/extension PIP AROM extension -40 index, -30 middle finger, -25 ring finger, -10 small finger, full flexion PIP PROM -10 degrees at all digits   Patient's Physical FS Primary Measure 71 Patient's intake functional measure is 71 out of 100 (higher number = greater function). Risk Adjusted Statistical FOTO* 57 Given the patient's risk-adjustment variables, like-patients nationally had a FS score of 57 at intake.           OT Education - 08/16/19 1806    Education Details  role of OT, goals    Person(s) Educated  Patient    Methods  Explanation    Comprehension  Verbalized understanding          OT Long Term Goals - 08/16/19 1845      OT LONG TERM GOAL #1   Title  Patient will demonstrate home exercise program with modified independence.    Baseline  no current program    Time  12    Period  Weeks    Status  New    Target Date  11/07/19      OT LONG TERM GOAL #2   Title  Patient will demonstrate cutting meat with modified independence.    Baseline  difficulty stabilizing meat for cutting with right hand.    Time  12    Period  Weeks    Status  New    Target Date  11/07/19      OT LONG TERM GOAL #3   Title  Patient will demonstrate improved grip on left by 10# to assist with work related tasks.    Baseline  75# at eval on left    Time  6    Period  Weeks    Status  New    Target Date  09/26/19      OT LONG TERM GOAL #4   Title  Patient will demonstrate improvement in strength in left UE by 1 mm grade to lift case of oil to place on shelf.    Baseline  difficulty  with task at eval    Time  12    Period  Weeks    Status  New    Target Date  11/07/19      OT LONG TERM GOAL #5   Title  Patient will demonstrate improved fine motor coordination skills by 20 secs on 9 hole peg test to be able to pick up objects one inch in size without dropping.    Baseline  9 hole peg test over 2 mins at eval, difficulty with picking up small objects.    Time  12    Period  Weeks    Status  New    Target Date  11/07/19            Plan - 08/16/19 1827    Clinical  Impression Statement  Patient is a 44 yo male who suffered a CVA on February 01, 2017 and demonstrates recent muscle weakness and lack of coordination skills limiting his ADL and IADL tasks. Patient seen for OT evaluation and presents with left UE decreased ROM both active and passive, muscle weakness, lack of coordination, decreased grip strength and prehension patterns, decreased precision and efficiency with work related tasks and decreased tool use. Patient would benefit from skilled OT services to [facility] safety and independence in necessary daily tasks at work, home and in the community.    OT Occupational Profile and History  Detailed Assessment- Review of Records and additional review of physical, cognitive, psychosocial history related to current functional performance    Occupational performance deficits (Please refer to evaluation for details):  ADL's;IADL's;Work    Body Structure / Function / Physical Skills  ADL;Coordination;GMC;UE functional use;Balance;Decreased knowledge of use of DME;Flexibility;IADL;Dexterity;FMC;Strength;Gait;ROM;Tone    Psychosocial Skills  Routines and Behaviors;Environmental  Adaptations    Rehab Potential  Good    Clinical Decision Making  Limited treatment options, no task modification necessary    Comorbidities Affecting Occupational Performance:  May have comorbidities impacting occupational performance    Modification or Assistance to Complete Evaluation   No  modification of tasks or assist necessary to complete eval    OT Frequency  1x / week    OT Duration  12 weeks    OT Treatment/Interventions  Self-care/ADL training;Cryotherapy;Therapeutic exercise;DME and/or AE instruction;Functional Mobility Training;Balance training;Electrical Stimulation;Neuromuscular education;Manual Therapy;Splinting;Moist Heat;Contrast Bath;Passive range of motion;Therapeutic activities;Patient/family education    Consulted and Agree with Plan of Care  Patient       Patient will benefit from skilled therapeutic intervention in order to improve the following deficits and impairments:   Body Structure / Function / Physical Skills: ADL, Coordination, GMC, UE functional use, Balance, Decreased knowledge of use of DME, Flexibility, IADL, Dexterity, FMC, Strength, Gait, ROM, Tone   Psychosocial Skills: Routines and Behaviors, Environmental  Adaptations   Visit Diagnosis: Muscle weakness (generalized)  Other lack of coordination  Hemiplegia and hemiparesis following cerebral infarction affecting left non-dominant side [facility])    Problem List Patient Active Problem List   Diagnosis Date Noted  . Thrombotic cerebral infarction ([place]) 02/04/2017  . 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 Poorly controlled type 2 diabetes mellitus with peripheral neuropathy ([place])   . Benign essential HTN   . Tobacco abuse   . Marijuana abuse   . Hyperlipidemia   . H/O medication noncompliance   . Acute CVA (cerebrovascular accident) [facility]) 02/01/2017   Jyra Lagares T Tomasita Morrow, OTR/L, CLT  Delontae Lamm 08/16/2019, 6:58 PM  [place] MAIN [facility] SERVICES [address] [place], [place], [address] Phone: [phone]   Fax:  [phone]  Name: KAYSON TASKER MRN: [id number] Date of Birth: 1975-12-22

## 2019-08-22 ENCOUNTER — Other Ambulatory Visit: Payer: Self-pay

## 2019-08-22 ENCOUNTER — Ambulatory Visit: Payer: Medicaid Other | Admitting: Physical Therapy

## 2019-08-22 ENCOUNTER — Ambulatory Visit: Payer: Medicaid Other | Admitting: Occupational Therapy

## 2019-08-29 ENCOUNTER — Ambulatory Visit: Payer: Medicaid Other | Admitting: Physical Therapy

## 2019-08-29 ENCOUNTER — Encounter: Payer: Self-pay | Admitting: Occupational Therapy

## 2019-08-29 ENCOUNTER — Encounter: Payer: Self-pay | Admitting: Physical Therapy

## 2019-08-29 ENCOUNTER — Other Ambulatory Visit: Payer: Self-pay

## 2019-08-29 ENCOUNTER — Ambulatory Visit: Payer: Medicaid Other | Admitting: Occupational Therapy

## 2019-08-29 DIAGNOSIS — R278 Other lack of coordination: Secondary | ICD-10-CM

## 2019-08-29 DIAGNOSIS — M6281 Muscle weakness (generalized): Secondary | ICD-10-CM

## 2019-08-29 DIAGNOSIS — I69354 Hemiplegia and hemiparesis following cerebral infarction affecting left non-dominant side: Secondary | ICD-10-CM

## 2019-08-29 DIAGNOSIS — M25512 Pain in left shoulder: Secondary | ICD-10-CM | POA: Diagnosis not present
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
## 2019-08-29 NOTE — Therapy (Signed)
[place] MAIN [facility] SERVICES [address] [place], [place], [address] Phone: [phone]   Fax:  [phone]  Physical Therapy Treatment  Patient Details  Name: Stanley Farley MRN: [id number] Date of Birth: 1975-09-22 Referring Provider (PT): Tomasa Hose   Encounter Date: 08/29/2019  PT End of Session - 08/29/19 1259    Visit Number  7    Number of Visits  25    Date for PT Re-Evaluation  09/15/19    Authorization Type  eval: 06/23/19    PT Start Time  1150    PT Stop Time  1235    PT Time Calculation (min)  45 min    Equipment Utilized During Treatment  Gait belt    Activity Tolerance  Patient tolerated treatment well    Behavior During Therapy  WFL for tasks assessed/performed       Past Medical History:  Diagnosis Date  . Diabetes mellitus without complication ([place])   . Hypertension     Past Surgical History:  Procedure Laterality Date  . TEE WITHOUT CARDIOVERSION N/A 02/04/2017   Procedure: TRANSESOPHAGEAL ECHOCARDIOGRAM (TEE);  Surgeon: Minna Merritts, MD;  Location: [facility] ORS;  Service: Cardiovascular;  Laterality: N/A;    There were no vitals filed for this visit.  Subjective Assessment - 08/29/19 1153    Subjective  Pt denies any pain upon arrival.  States he has not had his L LE give way and reports no problems with the L LE since last visit. Pt reports his L LE feels "about the same" with bending and lifting activities at work.    Pertinent History  Pt had a CVA 02/01/17 with LUE/LLE weakness. He was at [facility] for 3 days and then  in 7 days. He had HHPT for  1 month following the hospital stay. He came to [facility] for OP PT. He was referred back for additional therapy due to continued difficulty with LLE weakness. 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 He is struggling with gripping and lifting heavy boxes. He also reports hyperextension of L knee.    Patient  Stated Goals  Improve his L knee and ankle strength, improve his LUE strength    Currently in Pain?  No/denies    Multiple Pain Sites  No          Treatment:  NMR:  Tandem stance on flat surface without UE support in bars; f/b tandem stance on blue half foam roller in // bars without UE support.  Alt toe tapping on 4" step in // bars without UE support; SLS without UE support; marching in place on blue foam airex pad in // bars without UE support.  Therapeutic Ex:  Bilateral and Unilateral (L) leg press on Precor machine/ 70# and 20# respectively; sit to stands from green gym chair 2x10; standing hip / and abd in // bars 2x10 each; heel raises in // bars 20x; L ankle PF/DF and circles on dynadisc in sitting 20x.                     PT Education - 08/29/19 1258    Education Details  Educated pt in how to perform tandem stance standing at kitchen counter on bed pillow for more challenging position.    Person(s) Educated  Patient    Methods  Explanation;Demonstration    Comprehension  Verbalized understanding;Returned demonstration  PT Short Term Goals - 07/04/19 1702      PT SHORT TERM GOAL #1   Title  Pt will be independent with HEP in order to improve strength and balance in order to decrease fall risk and improve function at home and work.    Baseline  12/28: HEP compliant    Time  6    Period  Weeks    Status  Partially Met    Target Date  08/04/19        PT Long Term Goals - 08/08/19 1652      PT LONG TERM GOAL #1   Title  Pt will increase LEFS by at least 9 points in order to demonstrate significant improvement in lower extremity function.    Baseline  06/23/19: 60/80 12/28: 61/80 2/1: 63/80    Time  12    Period  Weeks    Status  Partially Met    Target Date  09/15/19      PT LONG TERM GOAL #2   Title  Pt will improve BERG by at least 3 points in order to demonstrate clinically significant improvement in balance.    Baseline  06/23/19:  51/56 12/28: 51/56 2/1: 53/56    Time  12    Period  Weeks    Status  Partially Met    Target Date  09/15/19      PT LONG TERM GOAL #3   Title  Pt will increase strength of LLE to at least 3+/5 throughout in order to demonstrate improvement in strength and function    Baseline  06/23/19: LLE (out of 5) hip: IR: 2-, ER: 2+, abduction: 2+, adduction: 2+, knee: flexion: 3, ankle plantarflexion: 2+/5, ankle dorsiflexion: 2+/5    Time  12    Period  Weeks    Status  Partially Met    Target Date  09/15/19      PT LONG TERM GOAL #4   Title  Pt will increase self-selected 10MWT to greater than 1.0 m/s in order to demonstrate clinically significant improvement in community ambulation.    Baseline  06/23/19: self-selected: 11.1s = 0.90 m/s; 12/28: 1.3 m/s    Time  12    Period  Weeks    Status  Achieved      PT LONG TERM GOAL #5   Title  Patient will increase six minute walk test distance to >1500 for progression to community age norm ambulator and improve gait mechanics with decreased episodes of L drop foot    Baseline  2/1: 954 ft with frequent episodes of L drop foot    Time  8    Period  Weeks    Status  New    Target Date  09/15/19            Plan - 08/29/19 1259    Clinical Impression Statement  Pt exhibited improved standing balance in tandem stance in // bars today without UE support and also on blue half foam roller without UE support today.  Added tandem stance on pillow at home at kitchen counter to HEP.  Pt needed verbal cueing with single leg press with L LE on leg press machine today for improved concentric and eccentric control with L LE.    Personal Factors and Comorbidities  Comorbidity 2;Past/Current Experience;Time since onset of injury/illness/exacerbation    Examination-Activity Limitations  Lift;Carry;Stand;Locomotion Level    Examination-Participation Restrictions  Community Activity;Shop;Yard Work    [employer]  Stable/Uncomplicated  Clinical Decision Making  Low    Rehab Potential  Fair    PT Frequency  2x / week    PT Duration  12 weeks    PT Treatment/Interventions  ADLs/Self Care Home Management;Aquatic Therapy;Biofeedback;Canalith Repostioning;Cryotherapy;Electrical Stimulation;Iontophoresis 62m/ml Dexamethasone;Moist Heat;Traction;Ultrasound;DME Instruction;Gait training;Stair training;Functional mobility training;Therapeutic activities;Therapeutic exercise;Balance training;Neuromuscular re-education;Patient/family education;Manual techniques;Dry needling;Energy conservation;Vestibular;Spinal Manipulations;Joint Manipulations    PT Next Visit Plan  Initiate HEP, NMES, strengthening, L single leg balance    PT Home Exercise Plan  sit to stands, toe tapping, tandem stance on pillow at kitchen counter    Consulted and Agree with Plan of Care  Patient       Patient will benefit from skilled therapeutic intervention in order to improve the following deficits and impairments:  Abnormal gait, Decreased balance, Decreased strength  Visit Diagnosis: Muscle weakness (generalized)  Other lack of coordination  Hemiplegia and hemiparesis following cerebral infarction affecting left non-dominant side ([facility]     Problem List Patient Active Problem List   Diagnosis Date Noted  . Thrombotic cerebral infarction ([place] 02/04/2017  . 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 Poorly controlled type 2 diabetes mellitus with peripheral neuropathy ([place]   . Benign essential HTN   . Tobacco abuse   . Marijuana abuse   . Hyperlipidemia   . H/O medication noncompliance   . Acute CVA (cerebrovascular accident) ([place] 02/01/2017    Laruen Risser, MPT 08/29/2019, 1:35 PM  [facility] [address] [address]Phone: [phone]  Fax:  [phone] Name: Stanley MCCAUGHANMRN: [id number] of Birth:  8December 06, 1977

## 2019-09-05 ENCOUNTER — Ambulatory Visit: Payer: Medicaid Other | Admitting: Occupational Therapy

## 2019-09-05 ENCOUNTER — Ambulatory Visit: Payer: Medicaid Other | Admitting: Physical Therapy

## 2019-09-12 ENCOUNTER — Ambulatory Visit: Payer: Medicaid Other | Admitting: Occupational Therapy

## 2019-09-12 ENCOUNTER — Encounter: Payer: Self-pay | Admitting: Occupational Therapy

## 2019-09-12 ENCOUNTER — Other Ambulatory Visit: Payer: Self-pay

## 2019-09-12 ENCOUNTER — Encounter: Payer: Self-pay | Admitting: Physical Therapy

## 2019-09-12 ENCOUNTER — Ambulatory Visit: Payer: Medicaid Other | Attending: Family Medicine | Admitting: Physical Therapy

## 2019-09-12 DIAGNOSIS — I6300 Cerebral infarction due to thrombosis of unspecified precerebral artery: Secondary | ICD-10-CM | POA: Diagnosis present

## 2019-09-12 DIAGNOSIS — R278 Other lack of coordination: Secondary | ICD-10-CM

## 2019-09-12 DIAGNOSIS — M6281 Muscle weakness (generalized): Secondary | ICD-10-CM | POA: Insufficient documentation

## 2019-09-12 DIAGNOSIS — M25512 Pain in left shoulder: Secondary | ICD-10-CM | POA: Insufficient documentation

## 2019-09-12 DIAGNOSIS — G8929 Other chronic pain: Secondary | ICD-10-CM | POA: Diagnosis present

## 2019-09-12 DIAGNOSIS — I69354 Hemiplegia and hemiparesis following cerebral infarction affecting left non-dominant side: Secondary | ICD-10-CM | POA: Diagnosis present

## 2019-09-12 NOTE — Therapy (Signed)
[place] MAIN [facility] SERVICES [address] [place], [place], [address] Phone: [phone]   Fax:  [phone]  Physical Therapy Treatment  Patient Details  Name: Stanley Farley MRN: [id number] Date of Birth: 10/12/1975 Referring Provider (PT): Tomasa Hose   Encounter Date: 09/12/2019  PT End of Session - 09/12/19 1111    Visit Number  8    Number of Visits  25    Date for PT Re-Evaluation  09/15/19    Authorization Type  eval: 06/23/19    PT Start Time  1105    PT Stop Time  1145    PT Time Calculation (min)  40 min    Equipment Utilized During Treatment  Gait belt    Activity Tolerance  Patient tolerated treatment well    Behavior During Therapy  WFL for tasks assessed/performed       Past Medical History:  Diagnosis Date  . Diabetes mellitus without complication ([place])   . Hypertension     Past Surgical History:  Procedure Laterality Date  . TEE WITHOUT CARDIOVERSION N/A 02/04/2017   Procedure: TRANSESOPHAGEAL ECHOCARDIOGRAM (TEE);  Surgeon: Minna Merritts, MD;  Location: [facility] ORS;  Service: Cardiovascular;  Laterality: N/A;    There were no vitals filed for this visit.  Subjective Assessment - 09/12/19 1110    Subjective  Pt denies any pain upon arrival.  Patient is doing a HEP for LE strengthening.    Pertinent History  Pt had a CVA 02/01/17 with LUE/LLE weakness. He was at [facility] for 3 days and then [place] in 7 days. He had HHPT for  1 month following the hospital stay. He came to [facility] for OP PT. He was referred back for additional therapy due to continued difficulty with LLE weakness. 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 He is struggling with gripping and lifting heavy boxes. He also reports hyperextension of L knee.    Patient Stated Goals  Improve his L knee and ankle strength, improve his LUE strength    Currently in Pain?  No/denies    Multiple Pain Sites  No        Treatment: Nu-step x 5 mins L4  Prone L eft knee flex x 15 Prone left hip ext x 15  TM x 5 mins 1.2 elevation 1   Berg balance is 53/56, LEFS 53/80 Strength improved in LLE hip abd is 3+/5 , and left knee 3+/5  Reviewed HEP Pt educated throughout session about proper posture and technique with exercises. Improved exercise technique, movement at target joints, use of target muscles after min to mod verbal, visual, tactile cues.      Pt educated throughout session about proper posture and technique with exercises. Improved exercise technique, movement at target joints, use of target muscles after min to mod verbal, visual, tactile cues. [facility] PT Assessment - 09/12/19 0001      Berg Balance Test   Sit to Stand  Able to stand without using hands and stabilize independently    Standing Unsupported  Able to stand safely 2 minutes    Sitting with Back Unsupported but Feet Supported on Floor or Stool  Able to sit safely and securely 2 minutes    Stand to Sit  Sits safely with minimal use of hands    Transfers  Able to transfer safely, minor use of hands    Standing Unsupported with Eyes Closed  Able  to stand 10 seconds safely    Standing Unsupported with Feet Together  Able to place feet together independently and stand 1 minute safely    From Standing, Reach Forward with Outstretched Arm  Can reach confidently >25 cm (10")    From Standing Position, Pick up Object from [place] to pick up shoe safely and easily    From Standing Position, Turn to Look Behind Over each Shoulder  Looks behind from both sides and weight shifts well    Turn 360 Degrees  Able to turn 360 degrees safely in 4 seconds or less    Standing Unsupported, Alternately Place Feet on Step/Stool  Able to stand independently and safely and complete 8 steps in 20 seconds    Standing Unsupported, One Foot in [place] to plae foot ahead of the other independently and hold 30 seconds    Standing on One Leg  Able to lift  leg independently and hold equal to or more than 3 seconds    Total Score  53                           PT Education - 09/12/19 1111    Education Details  HEP    Person(s) Educated  Patient    Methods  Explanation    Comprehension  Verbalized understanding;Returned demonstration       PT Short Term Goals - 07/04/19 1702      PT SHORT TERM GOAL #1   Title  Pt will be independent with HEP in order to improve strength and balance in order to decrease fall risk and improve function at home and work.    Baseline  12/28: HEP compliant    Time  6    Period  Weeks    Status  Partially Met    Target Date  08/04/19        PT Long Term Goals - 09/12/19 1120      PT LONG TERM GOAL #1   Title  Pt will increase LEFS by at least 9 points in order to demonstrate significant improvement in lower extremity function.    Baseline  06/23/19: 60/80 12/28: 61/80 2/1: 63/80, 09/12/19=58/80    Time  12    Period  Weeks    Status  Partially Met    Target Date  12/08/19      PT LONG TERM GOAL #2   Title  Pt will improve BERG by at least 3 points in order to demonstrate clinically significant improvement in balance.    Baseline  06/23/19: 51/56 12/28: 51/56 2/1: 53/56, 09/12/19=53/56    Time  12    Period  Weeks    Status  Partially Met    Target Date  12/08/19      PT LONG TERM GOAL #3   Title  Pt will increase strength of LLE to at least 3+/5 throughout in order to demonstrate improvement in strength and function    Baseline  06/23/19: LLE (out of 5) hip: IR: 2-, ER: 2+, abduction: 2+, adduction: 2+, knee: flexion: 3, ankle plantarflexion: 2+/5, ankle dorsiflexion: 2+/5, 09/12/19=LLE (out of 5) L hip flex 4/5 hip: IR: 2-, ER: 2+, abduction: 3+, adduction: 2+, knee: flexion: 3+, ankle plantarflexion: 2+/5, ankle dorsiflexion: 2+/5    Time  12    Period  Weeks    Status  Partially Met    Target Date  12/08/19  PT LONG TERM GOAL #4   Title  Pt will increase self-selected  10MWT to greater than 1.0 m/s in order to demonstrate clinically significant improvement in community ambulation.    Baseline  06/23/19: self-selected: 11.1s = 0.90 m/s; 12/28: 1.3 m/s    Time  12    Period  Weeks    Status  Achieved    Target Date  09/15/19      PT LONG TERM GOAL #5   Title  Patient will increase six minute walk test distance to >1500 for progression to community age norm ambulator and improve gait mechanics with decreased episodes of L drop foot    Baseline  2/1: 954 ft with frequent episodes of L drop foot, deferred due to time limitations    Time  12    Period  Weeks    Status  New    Target Date  12/08/19            Plan - 09/12/19 1112    Clinical Impression Statement  Patient instructed in  LE strengthening and intermediate balance exercise. Patients goals were checked and reviewed with strength increases in hip and knee. . Patient would benefit from additional skilled PT intervention to improve strength, balance and gait safety.   Personal Factors and Comorbidities  Comorbidity 2;Past/Current Experience;Time since onset of injury/illness/exacerbation    Examination-Activity Limitations  Lift;Carry;Stand;Locomotion Level    Examination-Participation Restrictions  Community Activity;Shop;Yard Work    Stability/Clinical Decision Making  Stable/Uncomplicated    Rehab Potential  Fair    PT Frequency  2x / week    PT Duration  12 weeks    PT Treatment/Interventions  ADLs/Self Care Home Management;Aquatic Therapy;Biofeedback;Canalith Repostioning;Cryotherapy;Electrical Stimulation;Iontophoresis 31m/ml Dexamethasone;Moist Heat;Traction;Ultrasound;DME Instruction;Gait training;Stair training;Functional mobility training;Therapeutic activities;Therapeutic exercise;Balance training;Neuromuscular re-education;Patient/family education;Manual techniques;Dry needling;Energy conservation;Vestibular;Spinal Manipulations;Joint Manipulations    PT Next Visit Plan  Initiate HEP,  NMES, strengthening, L single leg balance    PT Home Exercise Plan  sit to stands, toe tapping, tandem stance on pillow at kitchen counter    Consulted and Agree with Plan of Care  Patient       Patient will benefit from skilled therapeutic intervention in order to improve the following deficits and impairments:  Abnormal gait, Decreased balance, Decreased strength  Visit Diagnosis: Muscle weakness (generalized)  Other lack of coordination  Hemiplegia and hemiparesis following cerebral infarction affecting left non-dominant side (HCC)  Chronic left shoulder pain  Cerebral infarction due to thrombosis of precerebral artery ([facility]     Problem List Patient Active Problem List   Diagnosis Date Noted  . Th[place]otic cerebral infarction ([place] 02/04/2017  . 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 Poorly controlled type 2 diabetes mellitus with peripheral neuropathy ([place]   . Benign essential HTN   . Tobacco abuse   . Marijuana abuse   . Hyperlipidemia   . H/O medication noncompliance   . Acute CVA (cerebrovascular accident) ([place] 02/01/2017    MAlanson Puls PT DPT 09/12/2019, 11:54 AM  [place] [facility] [address] [place] [address]Phone: [phone]  Fax:  [phone] Name: TJAKSEN FIORELLAMRN: [id number] of Birth: 803-19-1977

## 2019-09-12 NOTE — Therapy (Signed)
[place] [facility] MAIN [facility] SERVICES [address] [place], [place], [address] Phone: [phone]   Fax:  [phone]  Occupational Therapy Treatment  Patient Details  Name: Stanley Farley MRN: [id number] Date of Birth: [birth year] No data recorded  Encounter Date: 09/12/2019  OT End of Session - 09/12/19 2117    Visit Number  3    Number of Visits  13    Date for OT Re-Evaluation  11/07/19    OT Start Time  1148    OT Stop Time  1230    OT Time Calculation (min)  42 min    Activity Tolerance  Patient tolerated treatment well    Behavior During Therapy  [facility] for tasks assessed/performed       Past Medical History:  Diagnosis Date  . Diabetes mellitus without complication (HCC)   . Hypertension     Past Surgical History:  Procedure Laterality Date  . TEE WITHOUT CARDIOVERSION N/A 02/04/2017   Procedure: TRANSESOPHAGEAL ECHOCARDIOGRAM (TEE);  Surgeon: Antonieta Iba, MD;  Location: [facility] ORS;  Service: Cardiovascular;  Laterality: N/A;    There were no vitals filed for this visit.  Subjective Assessment - 09/12/19 2116    Subjective   Pt. reports that he has been working with his LUE at home.    Patient is accompanied by:  Family member    Patient Stated Goals  Patient would like to ride his motorcycle again, get stronger in the left arm.    Currently in Pain?  No/denies     OT TREATMENT    Neuro muscular re-education:  Pt. worked on [facility] skills grasping 1" sticks on the [employer]. Pt. Had to adjust the position of the sticks in the dish when grasping them. Pt. Attempted to use his hand for storing the objects in his palm. Pt. has difficulty holding more than one in object in the ulnar aspect of his palm while using his 2nd digit, and thumb.   Therapeutic Exercise:  Pt. worked on left shoulder stabilization exercises, and motor control with his left shoulder at 90 degrees, and elbow extended. Pt. Performed shoulder protraction,  circular motion, and formulating the alphabet with a 4# wrist weight in place. Pt. worked on forearm supination with 4# dumbbell weight.  Pt. is making progress overall with his LUE ROM, and strength. Pt. requires cues for controlling proximal movements, and [facility] skills. Pt. continues to work on improving LUE motor control, and [facility] skills. Pt. presents with limited supination. Pt. continues to work on improving LUE functioning to increase engagement in ADLs, and IADL tasks                        OT Education - 09/12/19 2117    Education Details  LUE functioning    Person(s) Educated  Patient    Methods  Explanation    Comprehension  Verbalized understanding          OT Long Term Goals - 08/16/19 1845      OT LONG TERM GOAL #1   Title  Patient will demonstrate home exercise program with modified independence.    Baseline  no current program    Time  12    Period  Weeks    Status  New    Target Date  11/07/19      OT LONG TERM GOAL #2   Title  Patient will demonstrate cutting meat with modified independence.  Baseline  difficulty stabilizing meat for cutting with right hand.    Time  12    Period  Weeks    Status  New    Target Date  11/07/19      OT LONG TERM GOAL #3   Title  Patient will demonstrate improved grip on left by 10# to assist with work related tasks.    Baseline  75# at eval on left    Time  6    Period  Weeks    Status  New    Target Date  09/26/19      OT LONG TERM GOAL #4   Title  Patient will demonstrate improvement in strength in left UE by 1 mm grade to lift case of oil to place on shelf.    Baseline  difficulty with task at eval    Time  12    Period  Weeks    Status  New    Target Date  11/07/19      OT LONG TERM GOAL #5   Title  Patient will demonstrate improved fine motor coordination skills by 20 secs on 9 hole peg test to be able to pick up objects one inch in size without dropping.    Baseline  9 hole peg test over 2  mins at eval, difficulty with picking up small objects.    Time  12    Period  Weeks    Status  New    Target Date  11/07/19            Plan - 09/12/19 2118    Clinical Impression Statement  Pt. is making progress overall with his LUE ROM, and strength. Pt. Requires cues for controlling proximal movements, and [facility] skills. Pt. continues to work on improving LUE motor control, and [facility] skills. Pt. presents with limited supination. Pt. continues to work on improving LUE functioning to increase engagement in ADLs, and IADL tasks.   OT Occupational Profile and History  Problem Focused Assessment - Including review of records relating to presenting problem    Occupational performance deficits (Please refer to evaluation for details):  ADL's;IADL's;Work    Body Structure / Function / Physical Skills  ADL;Coordination;GMC;UE functional use;Balance;Decreased knowledge of use of DME;Flexibility;IADL;Dexterity;FMC;Strength;Gait;ROM;Tone    Psychosocial Skills  Routines and Behaviors;Environmental  Adaptations    Rehab Potential  Good    Clinical Decision Making  Limited treatment options, no task modification necessary    Comorbidities Affecting Occupational Performance:  May have comorbidities impacting occupational performance    Modification or Assistance to Complete Evaluation   No modification of tasks or assist necessary to complete eval    OT Frequency  1x / week    OT Duration  12 weeks    OT Treatment/Interventions  Self-care/ADL training;Cryotherapy;Therapeutic exercise;DME and/or AE instruction;Functional Mobility Training;Balance training;Electrical Stimulation;Neuromuscular education;Manual Therapy;Splinting;Moist Heat;Contrast Bath;Passive range of motion;Therapeutic activities;Patient/family education    Consulted and Agree with Plan of Care  Patient       Patient will benefit from skilled therapeutic intervention in order to improve the following deficits and impairments:   Body  Structure / Function / Physical Skills: ADL, Coordination, GMC, UE functional use, Balance, Decreased knowledge of use of DME, Flexibility, IADL, Dexterity, FMC, Strength, Gait, ROM, Tone   Psychosocial Skills: Routines and Behaviors, Environmental  Adaptations   Visit Diagnosis: Muscle weakness (generalized)  Other lack of coordination    Problem List Patient Active Problem List   Diagnosis Date Noted  .  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 Poorly controlled type 2 diabetes mellitus with peripheral neuropathy (HCC)   . Benign essential HTN   . Tobacco abuse   . Marijuana abuse   . Hyperlipidemia   . H/O medication noncompliance   . Acute CVA (cerebrovascular accident) (HCC) 02/01/2017    Olegario Messier, MS, OTR/L 09/12/2019, 9:27 PM  [place] [facility] MAIN [facility] SERVICES [address] [place], [place], [address] Phone: [phone]   Fax:  [phone]  Name: Stanley Farley MRN: [id number] Date of Birth: 02-27-1976

## 2019-09-19 ENCOUNTER — Ambulatory Visit: Payer: Medicaid Other

## 2019-09-19 ENCOUNTER — Ambulatory Visit: Payer: Medicaid Other | Admitting: Occupational Therapy

## 2019-09-19 ENCOUNTER — Other Ambulatory Visit: Payer: Self-pay

## 2019-09-19 DIAGNOSIS — G8929 Other chronic pain: Secondary | ICD-10-CM

## 2019-09-19 DIAGNOSIS — R278 Other lack of coordination: Secondary | ICD-10-CM

## 2019-09-19 DIAGNOSIS — M6281 Muscle weakness (generalized): Secondary | ICD-10-CM

## 2019-09-19 DIAGNOSIS — I69354 Hemiplegia and hemiparesis following cerebral infarction affecting left non-dominant side: Secondary | ICD-10-CM

## 2019-09-19 DIAGNOSIS — M25512 Pain in left shoulder: Secondary | ICD-10-CM

## 2019-09-19 NOTE — Therapy (Signed)
[place] [address] [place], [place], [address] Phone: [phone]   Fax:  [phone]  Physical Therapy Treatment  Patient Details  Name: Stanley Farley MRN: [id number] Date of Birth: Feb 13, 1976 Referring Provider (PT): Tomasa Hose   Encounter Date: 09/19/2019  PT End of Session - 09/19/19 1248    Visit Number  9    Number of Visits  25    Date for PT Re-Evaluation  12/05/19    Authorization Type  eval: 06/23/19    PT Start Time  1107    PT Stop Time  1146    PT Time Calculation (min)  39 min    Equipment Utilized During Treatment  Gait belt    Activity Tolerance  Patient tolerated treatment well    Behavior During Therapy  WFL for tasks assessed/performed       Past Medical History:  Diagnosis Date  . Diabetes mellitus without complication ([place])   . Hypertension     Past Surgical History:  Procedure Laterality Date  . TEE WITHOUT CARDIOVERSION N/A 02/04/2017   Procedure: TRANSESOPHAGEAL ECHOCARDIOGRAM (TEE);  Surgeon: Minna Merritts, MD;  Location: [facility] ORS;  Service: Cardiovascular;  Laterality: N/A;    There were no vitals filed for this visit.  Subjective Assessment - 09/19/19 1246    Subjective  Patient missed session earlier today but was able to come at a later time. Patient reports HEP compliance.    Pertinent History  Pt had a CVA 02/01/17 with LUE/LLE weakness. He was at [facility] for 3 days and then [facility] in 7 days. He had HHPT for  1 month following the hospital stay. He came to [facility] for OP PT. He was referred back for additional therapy due to continued difficulty with LLE weakness. 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 He is struggling with gripping and lifting heavy boxes. He also reports hyperextension of L knee.    Patient Stated Goals  Improve his L knee and ankle strength, improve his LUE strength    Currently in Pain?  No/denies           Treatment:   Standing:  airex pad tandem stance LLE back; one foot on airex pad one foot on 6" step  Tandem stance with horizontal head turns; 60 seconds Tandem stance with vertical head turns ; near LOB towards L 60 seconds bilateral heel raise, unilateral eccentric return to static position 15x   4lb ankle weight -standing hip extension with knee extended 12x each LE -standing hip abduction with cueing for neutral foot alignment 12x each LE -straight leg flexion 12x each LE, SUE support, very challenging  -seated LAQ 12x; excessive posterior trunk lean due to hamstring limitations -seated straight leg abduction/adduction 12x  Seated: -YTB foot inversion: unable to perform with band resistance, changed to AROM 10x to line -YTB ER/IR with knees together 15x, very challenging for patient   6" step seated toe taps for time, coordination, muscle activation, 30 second x 2 trials  Supine:  Contract relax LLE pressing into PT shoulder 15x.    Bridge 15x with cues for body mechanics Unilateral (LLE) bridge with opp LE extended 10x (added to HEP).      Pt educated throughout session about proper posture and technique with exercises. Improved exercise technique, movement at target joints, use of target muscles after min to mod verbal, visual, tactile cues.  PT Education - 09/19/19 1248    Education Details  exercise technique, body mechanics    Person(s) Educated  Patient    Methods  Explanation;Demonstration;Tactile cues;Verbal cues    Comprehension  Verbalized understanding;Returned demonstration;Verbal cues required;Tactile cues required       PT Short Term Goals - 07/04/19 1702      PT SHORT TERM GOAL #1   Title  Pt will be independent with HEP in order to improve strength and balance in order to decrease fall risk and improve function at home and work.    Baseline  12/28: HEP compliant    Time  6    Period  Weeks    Status  Partially  Met    Target Date  08/04/19        PT Long Term Goals - 09/12/19 1120      PT LONG TERM GOAL #1   Title  Pt will increase LEFS by at least 9 points in order to demonstrate significant improvement in lower extremity function.    Baseline  06/23/19: 60/80 12/28: 61/80 2/1: 63/80, 09/12/19=58/80    Time  12    Period  Weeks    Status  Partially Met    Target Date  12/08/19      PT LONG TERM GOAL #2   Title  Pt will improve BERG by at least 3 points in order to demonstrate clinically significant improvement in balance.    Baseline  06/23/19: 51/56 12/28: 51/56 2/1: 53/56, 09/12/19=53/56    Time  12    Period  Weeks    Status  Partially Met    Target Date  12/08/19      PT LONG TERM GOAL #3   Title  Pt will increase strength of LLE to at least 3+/5 throughout in order to demonstrate improvement in strength and function    Baseline  06/23/19: LLE (out of 5) hip: IR: 2-, ER: 2+, abduction: 2+, adduction: 2+, knee: flexion: 3, ankle plantarflexion: 2+/5, ankle dorsiflexion: 2+/5, 09/12/19=LLE (out of 5) L hip flex 4/5 hip: IR: 2-, ER: 2+, abduction: 3+, adduction: 2+, knee: flexion: 3+, ankle plantarflexion: 2+/5, ankle dorsiflexion: 2+/5    Time  12    Period  Weeks    Status  Partially Met    Target Date  12/08/19      PT LONG TERM GOAL #4   Title  Pt will increase self-selected 10MWT to greater than 1.0 m/s in order to demonstrate clinically significant improvement in community ambulation.    Baseline  06/23/19: self-selected: 11.1s = 0.90 m/s; 12/28: 1.3 m/s    Time  12    Period  Weeks    Status  Achieved    Target Date  09/15/19      PT LONG TERM GOAL #5   Title  Patient will increase six minute walk test distance to >1500 for progression to community age norm ambulator and improve gait mechanics with decreased episodes of L drop foot    Baseline  2/1: 954 ft with frequent episodes of L drop foot, deferred due to time limitations    Time  12    Period  Weeks    Status  New     Target Date  12/08/19            Plan - 09/19/19 1252    Clinical Impression Statement  Patient arrived late to session limiting session duration. Patient continues to be challenged with single limb stability without  support as well as LLE muscle activation and sequencing. The patient would benefit from further skilled PT intervention to maximize strength and progress towards goals.    Personal Factors and Comorbidities  Comorbidity 2;Past/Current Experience;Time since onset of injury/illness/exacerbation    Examination-Activity Limitations  Lift;Carry;Stand;Locomotion Level    Examination-Participation Restrictions  Community Activity;Shop;Yard Work    Stability/Clinical Decision Making  Stable/Uncomplicated    Rehab Potential  Fair    PT Frequency  2x / week    PT Duration  12 weeks    PT Treatment/Interventions  ADLs/Self Care Home Management;Aquatic Therapy;Biofeedback;Canalith Repostioning;Cryotherapy;Electrical Stimulation;Iontophoresis 30m/ml Dexamethasone;Moist Heat;Traction;Ultrasound;DME Instruction;Gait training;Stair training;Functional mobility training;Therapeutic activities;Therapeutic exercise;Balance training;Neuromuscular re-education;Patient/family education;Manual techniques;Dry needling;Energy conservation;Vestibular;Spinal Manipulations;Joint Manipulations    PT Next Visit Plan  Initiate HEP, NMES, strengthening, L single leg balance    PT Home Exercise Plan  sit to stands, toe tapping, tandem stance on pillow at kitchen counter    Consulted and Agree with Plan of Care  Patient       Patient will benefit from skilled therapeutic intervention in order to improve the following deficits and impairments:  Abnormal gait, Decreased balance, Decreased strength  Visit Diagnosis: Muscle weakness (generalized)  Other lack of coordination  Hemiplegia and hemiparesis following cerebral infarction affecting left non-dominant side (HCC)  Chronic left shoulder  pain     Problem List Patient Active Problem List   Diagnosis Date Noted  . Thrombotic cerebral infarction ([place] 02/04/2017  . 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 Poorly controlled type 2 diabetes mellitus with peripheral neuropathy ([place]   . Benign essential HTN   . Tobacco abuse   . Marijuana abuse   . Hyperlipidemia   . H/O medication noncompliance   . Acute CVA (cerebrovascular accident) ([place] 02/01/2017   MJanna Arch PT, DPT   09/19/2019, 12:53 PM  [place] [address] [place] [address]Phone: [phone]  Fax:  [phone] Name: TNINO AMANOMRN: [id number] of Birth: 812-30-1977

## 2019-09-19 NOTE — Addendum Note (Signed)
Addended by: Ezekiel Ina on: 09/19/2019 01:00 PM   Modules accepted: Orders

## 2019-09-26 ENCOUNTER — Ambulatory Visit: Payer: Medicaid Other

## 2019-09-26 ENCOUNTER — Ambulatory Visit: Payer: Medicaid Other | Admitting: Occupational Therapy

## 2019-10-03 ENCOUNTER — Encounter: Payer: Medicaid Other | Admitting: Occupational Therapy

## 2019-10-03 ENCOUNTER — Ambulatory Visit: Payer: Medicaid Other

## 2019-10-10 ENCOUNTER — Ambulatory Visit: Payer: Medicaid Other | Attending: Family Medicine

## 2019-10-17 ENCOUNTER — Ambulatory Visit: Payer: Medicaid Other

## 2019-10-17 ENCOUNTER — Ambulatory Visit: Payer: Medicaid Other | Admitting: Occupational Therapy

## 2019-10-24 ENCOUNTER — Ambulatory Visit: Payer: Medicaid Other

## 2019-10-24 ENCOUNTER — Encounter: Payer: Medicaid Other | Admitting: Occupational Therapy

## 2019-10-31 ENCOUNTER — Ambulatory Visit: Payer: Medicaid Other

## 2019-10-31 ENCOUNTER — Encounter: Payer: Medicaid Other | Admitting: Occupational Therapy

## 2019-12-20 DIAGNOSIS — Z789 Other specified health status: Secondary | ICD-10-CM | POA: Insufficient documentation

## 2020-05-21 ENCOUNTER — Encounter: Payer: Self-pay | Admitting: Emergency Medicine

## 2020-05-21 ENCOUNTER — Other Ambulatory Visit: Payer: Self-pay

## 2020-05-21 ENCOUNTER — Emergency Department
Admission: EM | Admit: 2020-05-21 | Discharge: 2020-05-21 | Disposition: A | Discharge status: Home / Self Care | Payer: Medicaid Other | Attending: Emergency Medicine | Admitting: Emergency Medicine

## 2020-05-21 DIAGNOSIS — E11649 Type 2 diabetes mellitus with hypoglycemia without coma: Secondary | ICD-10-CM | POA: Insufficient documentation

## 2020-05-21 DIAGNOSIS — Z79899 Other long term (current) drug therapy: Secondary | ICD-10-CM | POA: Insufficient documentation

## 2020-05-21 DIAGNOSIS — F17290 Nicotine dependence, other tobacco product, uncomplicated: Secondary | ICD-10-CM | POA: Insufficient documentation

## 2020-05-21 DIAGNOSIS — I10 Essential (primary) hypertension: Secondary | ICD-10-CM | POA: Insufficient documentation

## 2020-05-21 DIAGNOSIS — Z7982 Long term (current) use of aspirin: Secondary | ICD-10-CM | POA: Diagnosis not present

## 2020-05-21 DIAGNOSIS — R531 Weakness: Secondary | ICD-10-CM | POA: Insufficient documentation

## 2020-05-21 DIAGNOSIS — Z7984 Long term (current) use of oral hypoglycemic drugs: Secondary | ICD-10-CM | POA: Diagnosis not present

## 2020-05-21 DIAGNOSIS — Z794 Long term (current) use of insulin: Secondary | ICD-10-CM | POA: Insufficient documentation

## 2020-05-21 DIAGNOSIS — E162 Hypoglycemia, unspecified: Secondary | ICD-10-CM

## 2020-05-21 HISTORY — DX: Cerebral infarction, unspecified: I63.9

## 2020-05-21 LAB — CBC
HCT: 33.8 % — ABNORMAL LOW (ref 39.0–52.0)
Hemoglobin: 11.1 g/dL — ABNORMAL LOW (ref 13.0–17.0)
MCH: 32.3 pg (ref 26.0–34.0)
MCHC: 32.8 g/dL (ref 30.0–36.0)
MCV: 98.3 fL (ref 80.0–100.0)
Platelets: 192 10*3/uL (ref 150–400)
RBC: 3.44 MIL/uL — ABNORMAL LOW (ref 4.22–5.81)
RDW: 12.9 % (ref 11.5–15.5)
WBC: 7.4 10*3/uL (ref 4.0–10.5)
nRBC: 0 % (ref 0.0–0.2)

## 2020-05-21 LAB — COMPREHENSIVE METABOLIC PANEL
ALT: 18 U/L (ref 0–44)
AST: 19 U/L (ref 15–41)
Albumin: 3.7 g/dL (ref 3.5–5.0)
Alkaline Phosphatase: 54 U/L (ref 38–126)
Anion gap: 12 (ref 5–15)
BUN: 22 mg/dL — ABNORMAL HIGH (ref 6–20)
CO2: 24 mmol/L (ref 22–32)
Calcium: 9.2 mg/dL (ref 8.9–10.3)
Chloride: 107 mmol/L (ref 98–111)
Creatinine, Ser: 1.53 mg/dL — ABNORMAL HIGH (ref 0.61–1.24)
GFR, Estimated: 57 mL/min — ABNORMAL LOW (ref >60)
Glucose, Bld: 156 mg/dL — ABNORMAL HIGH (ref 70–99)
Potassium: 3.9 mmol/L (ref 3.5–5.1)
Sodium: 143 mmol/L (ref 135–145)
Total Bilirubin: 0.5 mg/dL (ref 0.3–1.2)
Total Protein: 7.4 g/dL (ref 6.5–8.1)

## 2020-05-21 LAB — CBG MONITORING, ED
Glucose-Capillary: 132 mg/dL — ABNORMAL HIGH (ref 70–99)
Glucose-Capillary: 43 mg/dL — CL (ref 70–99)
Glucose-Capillary: 144 mg/dL — ABNORMAL HIGH (ref 70–99)
Glucose-Capillary: 261 mg/dL — ABNORMAL HIGH (ref 70–99)

## 2020-05-21 LAB — TROPONIN I (HIGH SENSITIVITY): Troponin I (High Sensitivity): 7 ng/L (ref <18)

## 2020-05-21 MED ORDER — DEXTROSE 50 % IV SOLN
INTRAVENOUS | Status: AC
  Filled 2020-05-21: qty 50

## 2020-05-21 MED ORDER — KETOROLAC TROMETHAMINE 30 MG/ML IJ SOLN
30.0000 mg | Freq: Once | INTRAMUSCULAR | Status: AC
  Administered 2020-05-21: 30 mg via INTRAVENOUS
  Filled 2020-05-21: qty 1

## 2020-05-21 MED ORDER — DEXTROSE 50 % IV SOLN
1.0000 | Freq: Once | INTRAVENOUS | Status: AC
  Administered 2020-05-21: 50 mL via INTRAVENOUS

## 2020-05-21 NOTE — ED Notes (Signed)
Pt given graham crackers, peanut butter, and OJ at this time.

## 2020-05-21 NOTE — ED Notes (Signed)
Pt ringing call bell, this RN to bedside, pt c/o "very intense emergency leg cramps" and asking why nobody has been in the room in hours to address this emergency and when he can leave This RN explained that this will be handled as soon as able and explained cannot give time frame. Stretcher locked in lowest position. Wife at bedside  Dr Lenard Lance messaged to notify of pain

## 2020-05-21 NOTE — ED Triage Notes (Signed)
Pt via POV from home. Pt states that he took his CBG at 0730ish and it was in the 200s. Pt took 40u of his Lantus this morning because he missed his dose last night. Denies eating anything this morning. Pt states he was riding in the car to his wife's doctor appt where he became very diaphoretic and weak. On arrival, CBG 44. Given D50 and Dr. Lenard Lance, MD at bedside. Pt is A&Ox4

## 2020-05-21 NOTE — ED Provider Notes (Signed)
[facility] Emergency Department Provider Note  Time seen: 10:05 AM  I have reviewed the triage vital signs and the nursing notes.   HISTORY  Chief Complaint Weakness, hyperglycemia   HPI Stanley Farley is a 44 y.o. male with a past medical history of hypertension, diabetes, hyperlipidemia, prior CVA, presents to the emergency department for generalized weakness and hypoglycemia.  According to the patient he checks his blood sugar this morning and it was around 250.  Patient states he forgot to take his Lantus insulin last night and dosed himself 40 units this morning which he states is fairly typical.  Patient did not eat breakfast and shortly after dosing began feeling very weak became diaphoretic.  Patient states he attempted to eat and drink food at home but continued to feel bad and diaphoretic so he came to the emergency department.  Upon arrival to the emergency department patient has a blood glucose of 43 and is extremely diaphoretic but he is mentating able to give a good history.  States this is happened multiple times in the past and at 1 point actually took him off of his Lantus insulin but have since put him back on it.  Patient denies any recent illnesses fever cough congestion shortness of breath vomiting or diarrhea.     Past Medical History:  Diagnosis Date  . Diabetes mellitus without complication (HCC)   . Hypertension     Patient Active Problem List   Diagnosis Date Noted  . 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 Poorly controlled type 2 diabetes mellitus with peripheral neuropathy (HCC)   . Benign essential HTN   . Tobacco abuse   . Marijuana abuse   . Hyperlipidemia   . H/O medication noncompliance   . Acute CVA (cerebrovascular accident) (HCC) 02/01/2017    Past Surgical History:  Procedure Laterality Date  . TEE WITHOUT  CARDIOVERSION N/A 02/04/2017   Procedure: TRANSESOPHAGEAL ECHOCARDIOGRAM (TEE);  Surgeon: Antonieta Iba, MD;  Location: [facility] ORS;  Service: Cardiovascular;  Laterality: N/A;    Prior to Admission medications   Medication Sig Start Date End Date Taking? Authorizing Provider  amLODipine (NORVASC) 10 MG tablet Take 10 mg by mouth daily.    [provider]  aspirin 325 MG tablet Take 1 tablet (325 mg total) by mouth daily. 02/06/17   Angiulli, Mcarthur Rossetti, PA-C  atorvastatin (LIPITOR) 40 MG tablet Take 1 tablet (40 mg total) by mouth daily at 6 PM. 02/06/17   Angiulli, Mcarthur Rossetti, PA-C  glipiZIDE (GLUCOTROL XL) 10 MG 24 hr tablet Take 1 tablet (10 mg total) by mouth daily with breakfast. Patient not taking: Reported on 06/23/2019 02/07/17   Angiulli, Mcarthur Rossetti, PA-C  hydrochlorothiazide (HYDRODIURIL) 25 MG tablet Take 25 mg by mouth daily.    [provider]  insulin glargine (LANTUS) 100 unit/mL SOPN Inject 0.15 mLs (15 Units total) into the skin at bedtime. 02/06/17   Angiulli, Mcarthur Rossetti, PA-C  lisinopril (PRINIVIL,ZESTRIL) 20 MG tablet Take 1 tablet (20 mg total) by mouth daily. 02/06/17   Angiulli, Mcarthur Rossetti, PA-C  nicotine (NICODERM CQ - DOSED IN MG/24 HOURS) 14 mg/24hr patch 14 mg patch daily 2 weeks then 7 mg patch daily 3 weeks and stop Patient not taking: Reported on 06/23/2019 02/06/17   Angiulli, Mcarthur Rossetti, PA-C    No Known Allergies  Family History  Problem Relation Age of  Onset  . Diabetes Mother   . Hypertension Father   . Heart attack Father     Social History Social History   Tobacco Use  . Smoking status: Current Every Day Smoker    Years: 5.00    Types: Cigars  . Smokeless tobacco: Never Used  Substance Use Topics  . Alcohol use: No  . Drug use: Yes    Types: Marijuana    Review of Systems Constitutional: Negative for fever.  Positive for generalized weakness/fatigue Cardiovascular: Negative for chest pain. Respiratory: Negative for shortness of  breath. Gastrointestinal: Negative for abdominal pain, vomiting and diarrhea. Musculoskeletal: Negative for musculoskeletal complaints Skin: Positive for diaphoresis Neurological: Negative for headache All other ROS negative  ____________________________________________   PHYSICAL EXAM:  Constitutional: Alert and oriented.  Diaphoretic, weak appearing.  Able to stand on his own from the wheelchair and transfer into the bed. Eyes: Normal exam ENT      Head: Normocephalic and atraumatic.      Mouth/Throat: Mucous membranes are moist. Cardiovascular: Normal rate, regular rhythm.  Respiratory: Normal respiratory effort without tachypnea nor retractions. Breath sounds are clear  Gastrointestinal: Soft and nontender. No distention.   Musculoskeletal: Nontender with normal range of motion in all extremities. Neurologic:  Normal speech and language. No gross focal neurologic deficits Skin: Significant diaphoresis. Psychiatric: Mood and affect are normal.   ____________________________________________    EKG  EKG viewed and interpreted by myself shows a normal sinus rhythm at 70 bpm with a narrow QRS, normal axis, normal intervals, no concerning ST changes.  ____________________________________________    INITIAL IMPRESSION / ASSESSMENT AND PLAN / ED COURSE  Pertinent labs & imaging results that were available during my care of the patient were reviewed by me and considered in my medical decision making (see chart for details).   Patient presents to the emergency department for hypoglycemia generalized weakness.  Currently patient appears diaphoretic appears weak but is mentating well and able to give a good history.  Patient dosed an amp of D50 upon arrival after blood glucose reading of 43.  We will continue to closely monitor the patient's blood glucose readings and dose glucose as needed.  As the patient took Levemir/long-acting insulin he will require prolonged stay in the  emergency department until we can verify his blood sugars not dropping.  We will allow the patient to eat and drink as tolerated.  We will also check labs to evaluate for any other acute issues.  Patient's work-up is overall reassuring.  Blood glucose has remained elevated.  Patient has been able to eat.  Patient is asking to be discharged.  As the patient has been in the emergency department 3-1/2 hours and is maintaining normal blood sugars I believe the patient is safe for discharge home.  I discussed with the patient to continue to check his blood sugar at home continue to eat small meals throughout the day.  I also discussed with the patient follow-up with his doctor before restarting Levemir.  Patient agreeable to plan of care.  ATTILA MCCARTHY was evaluated in Emergency Department on 05/21/2020 for the symptoms described in the history of present illness. He was evaluated in the context of the global COVID-19 pandemic, which necessitated consideration that the patient might be at risk for infection with the SARS-CoV-2 virus that causes COVID-19. Institutional protocols and algorithms that pertain to the evaluation of patients at risk for COVID-19 are in a state of rapid change based on information released by regulatory  bodies including the CDC and federal and state organizations. These policies and algorithms were followed during the patient's care in the ED.  ____________________________________________   FINAL CLINICAL IMPRESSION(S) / ED DIAGNOSES  Hypoglycemia Weakness   Minna Antis, MD 05/21/20 1318

## 2020-05-21 NOTE — Discharge Instructions (Addendum)
Please check your blood sugar throughout the day today to ensure it does not drop.  Please eat small meals throughout the day.  Please follow-up with your primary care doctor before restarting Levemir.  Return to the emergency department for any further episodes of low blood sugar, weakness, or any other symptom personally concerning to yourself.

## 2020-09-13 ENCOUNTER — Ambulatory Visit (INDEPENDENT_AMBULATORY_CARE_PROVIDER_SITE_OTHER): Payer: Medicaid Other | Admitting: Gastroenterology

## 2020-09-13 ENCOUNTER — Other Ambulatory Visit: Payer: Self-pay

## 2020-09-13 ENCOUNTER — Encounter: Payer: Self-pay | Admitting: Gastroenterology

## 2020-09-13 VITALS — BP 119/68 | HR 79 | Temp 97.8°F | Ht 74.0 in | Wt 198.0 lb

## 2020-09-13 DIAGNOSIS — R197 Diarrhea, unspecified: Secondary | ICD-10-CM | POA: Diagnosis not present

## 2020-09-13 DIAGNOSIS — R112 Nausea with vomiting, unspecified: Secondary | ICD-10-CM

## 2020-09-13 NOTE — Progress Notes (Signed)
Gastroenterology Consultation  Referring Provider:     Emogene Morgan, MD Primary Care Physician:  Emogene Morgan, MD Primary Gastroenterologist:  Dr. Servando Snare     Reason for Consultation:     Nausea vomiting and diarrhea        HPI:   Stanley Farley is a 45 y.o. y/o male referred for consultation & management of nausea vomiting and diarrhea by Dr. Letta Pate, Sylvie Farrier, MD.  This patient comes in today with a history of diabetes since he was 20.  He reports that he has been on insulin and Metformin but he was taken off of his insulin because his sugars were running too low and now is on Metformin.  He is not sure if the Metformin is contributing to his diarrhea.  The patient states that he was having an upset stomach and burping up some sour tasting food and gas and started to take some Pepto-Bismol whereupon he got some black stools.  He had told his primary care provider about the black stools but he is not sure if he mentioned that he was on Pepto-Bismol at the same time.  There is no report of any unexplained weight loss but the patient does report that he has bloating and feels full early.  He thinks that meat is worse for his digestive system since he reports that he can eat meat and vomited up many hours to even a day later with undigested food.  The patient has never had an EGD or colonoscopy in the past.  The patient also reports that the diarrhea has woken him up from sleep but he has also soiled his bed at night with the diarrhea.  He denies any excessive use of dairy products.  He also does not notice dairy products make his symptoms any better or worse.  The patient does report that he had a stroke about 4 years ago.  Past Medical History:  Diagnosis Date  . Diabetes mellitus without complication (HCC)   . Hypertension   . Stroke [facility])     Past Surgical History:  Procedure Laterality Date  . TEE WITHOUT CARDIOVERSION N/A 02/04/2017   Procedure: TRANSESOPHAGEAL ECHOCARDIOGRAM (TEE);   Surgeon: Antonieta Iba, MD;  Location: [facility] ORS;  Service: Cardiovascular;  Laterality: N/A;    Prior to Admission medications   Medication Sig Start Date End Date Taking? Authorizing Provider  amLODipine (NORVASC) 10 MG tablet Take 10 mg by mouth daily.    [provider]  aspirin 325 MG tablet Take 1 tablet (325 mg total) by mouth daily. 02/06/17   Angiulli, Mcarthur Rossetti, PA-C  atorvastatin (LIPITOR) 40 MG tablet Take 1 tablet (40 mg total) by mouth daily at 6 PM. 02/06/17   Angiulli, Mcarthur Rossetti, PA-C  glipiZIDE (GLUCOTROL XL) 10 MG 24 hr tablet Take 1 tablet (10 mg total) by mouth daily with breakfast. Patient not taking: Reported on 06/23/2019 02/07/17   Angiulli, Mcarthur Rossetti, PA-C  hydrochlorothiazide (HYDRODIURIL) 25 MG tablet Take 25 mg by mouth daily.    [provider]  insulin glargine (LANTUS) 100 unit/mL SOPN Inject 0.15 mLs (15 Units total) into the skin at bedtime. 02/06/17   Angiulli, Mcarthur Rossetti, PA-C  lisinopril (PRINIVIL,ZESTRIL) 20 MG tablet Take 1 tablet (20 mg total) by mouth daily. 02/06/17   Angiulli, Mcarthur Rossetti, PA-C  nicotine (NICODERM CQ - DOSED IN MG/24 HOURS) 14 mg/24hr patch 14 mg patch daily 2 weeks then 7 mg patch daily 3 weeks and  stop Patient not taking: Reported on 06/23/2019 02/06/17   Angiulli, Mcarthur Rossetti, PA-C    Family History  Problem Relation Age of Onset  . Diabetes Mother   . Hypertension Father   . Heart attack Father      Social History   Tobacco Use  . Smoking status: Current Every Day Smoker    Years: 5.00    Types: Cigars  . Smokeless tobacco: Never Used  Substance Use Topics  . Alcohol use: No  . Drug use: Yes    Types: Marijuana    Allergies as of 09/13/2020  . (No Known Allergies)    Review of Systems:    All systems reviewed and negative except where noted in HPI.   Physical Exam:  There were no vitals taken for this visit. No LMP for male patient. General:   Alert,  Well-developed, well-nourished, pleasant and cooperative  in NAD Head:  Normocephalic and atraumatic. Eyes:  Sclera clear, no icterus.   Conjunctiva pink. Ears:  Normal auditory acuity. Neck:  Supple; no masses or thyromegaly. Lungs:  Respirations even and unlabored.  Clear throughout to auscultation.   No wheezes, crackles, or rhonchi. No acute distress. Heart:  Regular rate and rhythm; no murmurs, clicks, rubs, or gallops. Abdomen:  Normal bowel sounds.  No bruits.  Soft, non-tender and non-distended without masses, hepatosplenomegaly or hernias noted.  No guarding or rebound tenderness.  Negative Carnett sign.   Rectal:  Deferred.  Pulses:  Normal pulses noted. Extremities:  No clubbing or edema.  No cyanosis. Neurologic:  Alert and oriented x3;  grossly normal neurologically except for some weakness and contractures of the left arm. Skin:  Intact without significant lesions or rashes.  No jaundice. Lymph Nodes:  No significant cervical adenopathy. Psych:  Alert and cooperative. Normal mood and affect.  Imaging Studies: No results found.  Assessment and Plan:   Stanley Farley is a 45 y.o. y/o male who comes in with a longstanding history of diabetes and now he reports symptoms consistent with possible gastroparesis with nausea vomiting bloating and dyspepsia.  The patient also has diarrhea and he is not sure if it is caused by his Metformin.  The patient has been told that Metformin can cause diarrhea as can diabetes.  The patient will be set up for a EGD and colonoscopy for his nausea and vomiting and diarrhea.  He will also be set up for gastric emptying study to see if he has gastroparesis.  The patient has been explained the plan and agrees with it.    [name], MD. Clementeen Graham    Note: This dictation was prepared with Dragon dictation along with smaller phrase technology. Any transcriptional errors that result from this process are unintentional.

## 2020-09-18 ENCOUNTER — Encounter: Payer: Self-pay | Admitting: Gastroenterology

## 2020-09-18 ENCOUNTER — Other Ambulatory Visit: Payer: Self-pay

## 2020-09-20 ENCOUNTER — Other Ambulatory Visit: Payer: Self-pay

## 2020-09-20 ENCOUNTER — Other Ambulatory Visit
Admission: RE | Admit: 2020-09-20 | Discharge: 2020-09-20 | Disposition: A | Discharge status: Home / Self Care | Payer: Medicaid Other | Source: Ambulatory Visit | Attending: Gastroenterology | Admitting: Gastroenterology

## 2020-09-20 DIAGNOSIS — Z01812 Encounter for preprocedural laboratory examination: Secondary | ICD-10-CM | POA: Insufficient documentation

## 2020-09-20 DIAGNOSIS — Z20822 Contact with and (suspected) exposure to covid-19: Secondary | ICD-10-CM | POA: Diagnosis not present

## 2020-09-21 LAB — SARS CORONAVIRUS 2 (TAT 6-24 HRS): SARS Coronavirus 2: NEGATIVE

## 2020-09-21 NOTE — Discharge Instructions (Signed)

## 2020-09-24 ENCOUNTER — Ambulatory Visit
Admission: RE | Admit: 2020-09-24 | Discharge: 2020-09-24 | Disposition: A | Discharge status: Home / Self Care | Payer: Medicaid Other | Attending: Gastroenterology | Admitting: Gastroenterology

## 2020-09-24 ENCOUNTER — Other Ambulatory Visit: Payer: Self-pay

## 2020-09-24 ENCOUNTER — Encounter
Admission: RE | Disposition: A | Discharge status: Home / Self Care | Payer: Self-pay | Source: Home / Self Care | Attending: Gastroenterology

## 2020-09-24 ENCOUNTER — Ambulatory Visit: Payer: Medicaid Other | Admitting: Anesthesiology

## 2020-09-24 ENCOUNTER — Encounter: Payer: Self-pay | Admitting: Gastroenterology

## 2020-09-24 DIAGNOSIS — R112 Nausea with vomiting, unspecified: Secondary | ICD-10-CM | POA: Diagnosis not present

## 2020-09-24 DIAGNOSIS — I10 Essential (primary) hypertension: Secondary | ICD-10-CM | POA: Diagnosis not present

## 2020-09-24 DIAGNOSIS — Z79899 Other long term (current) drug therapy: Secondary | ICD-10-CM | POA: Insufficient documentation

## 2020-09-24 DIAGNOSIS — Z87891 Personal history of nicotine dependence: Secondary | ICD-10-CM | POA: Diagnosis not present

## 2020-09-24 DIAGNOSIS — K529 Noninfective gastroenteritis and colitis, unspecified: Secondary | ICD-10-CM | POA: Diagnosis present

## 2020-09-24 DIAGNOSIS — Z8249 Family history of ischemic heart disease and other diseases of the circulatory system: Secondary | ICD-10-CM | POA: Insufficient documentation

## 2020-09-24 DIAGNOSIS — I69354 Hemiplegia and hemiparesis following cerebral infarction affecting left non-dominant side: Secondary | ICD-10-CM | POA: Insufficient documentation

## 2020-09-24 DIAGNOSIS — E119 Type 2 diabetes mellitus without complications: Secondary | ICD-10-CM | POA: Insufficient documentation

## 2020-09-24 DIAGNOSIS — K6389 Other specified diseases of intestine: Secondary | ICD-10-CM | POA: Insufficient documentation

## 2020-09-24 DIAGNOSIS — R1013 Epigastric pain: Secondary | ICD-10-CM | POA: Insufficient documentation

## 2020-09-24 DIAGNOSIS — Z7984 Long term (current) use of oral hypoglycemic drugs: Secondary | ICD-10-CM | POA: Insufficient documentation

## 2020-09-24 DIAGNOSIS — R197 Diarrhea, unspecified: Secondary | ICD-10-CM

## 2020-09-24 DIAGNOSIS — R1110 Vomiting, unspecified: Secondary | ICD-10-CM

## 2020-09-24 DIAGNOSIS — Z833 Family history of diabetes mellitus: Secondary | ICD-10-CM | POA: Diagnosis not present

## 2020-09-24 HISTORY — PX: ESOPHAGOGASTRODUODENOSCOPY (EGD) WITH PROPOFOL: SHX5813

## 2020-09-24 HISTORY — PX: COLONOSCOPY WITH PROPOFOL: SHX5780

## 2020-09-24 LAB — GLUCOSE, CAPILLARY: Glucose-Capillary: 186 mg/dL — ABNORMAL HIGH (ref 70–99)

## 2020-09-24 SURGERY — COLONOSCOPY WITH PROPOFOL
Anesthesia: General | Site: Rectum

## 2020-09-24 MED ORDER — LIDOCAINE HCL (CARDIAC) PF 100 MG/5ML IV SOSY
PREFILLED_SYRINGE | INTRAVENOUS | Status: DC | PRN
  Administered 2020-09-24: 30 mg via INTRAVENOUS

## 2020-09-24 MED ORDER — LACTATED RINGERS IV SOLN: INTRAVENOUS | Status: DC

## 2020-09-24 MED ORDER — SODIUM CHLORIDE 0.9 % IV SOLN: INTRAVENOUS | Status: DC

## 2020-09-24 MED ORDER — PROPOFOL 10 MG/ML IV BOLUS
INTRAVENOUS | Status: DC | PRN
  Administered 2020-09-24 (×2): 40 mg via INTRAVENOUS
  Administered 2020-09-24: 20 mg via INTRAVENOUS
  Administered 2020-09-24: 150 mg via INTRAVENOUS
  Administered 2020-09-24: 30 mg via INTRAVENOUS
  Administered 2020-09-24 (×2): 40 mg via INTRAVENOUS
  Administered 2020-09-24: 30 mg via INTRAVENOUS

## 2020-09-24 MED ORDER — ACETAMINOPHEN 160 MG/5ML PO SOLN: 325.0000 mg | Freq: Once | ORAL | Status: DC

## 2020-09-24 MED ORDER — STERILE WATER FOR IRRIGATION IR SOLN: Status: DC | PRN

## 2020-09-24 MED ORDER — GLYCOPYRROLATE 0.2 MG/ML IJ SOLN
INTRAMUSCULAR | Status: DC | PRN
  Administered 2020-09-24: .1 mg via INTRAVENOUS

## 2020-09-24 MED ORDER — ACETAMINOPHEN 325 MG PO TABS: 325.0000 mg | ORAL_TABLET | Freq: Once | ORAL | Status: DC

## 2020-09-24 SURGICAL SUPPLY — 8 items
BLOCK BITE 60FR ADLT L/F GRN (MISCELLANEOUS) ×3 IMPLANT
FORCEPS BIOP RAD 4 LRG CAP 4 (CUTTING FORCEPS) ×3 IMPLANT
GOWN CVR UNV OPN BCK APRN NK (MISCELLANEOUS) ×4 IMPLANT
GOWN ISOL THUMB LOOP REG UNIV (MISCELLANEOUS) ×6
KIT PRC NS LF DISP ENDO (KITS) ×2 IMPLANT
KIT PROCEDURE OLYMPUS (KITS) ×3
MANIFOLD NEPTUNE II (INSTRUMENTS) ×3 IMPLANT
WATER STERILE IRR 250ML POUR (IV SOLUTION) ×3 IMPLANT

## 2020-09-24 NOTE — Op Note (Signed)
[facility] Patient Name: Stanley Farley Procedure Date: 09/24/2020 8:46 AM MRN: [id number] Account #: [id number] Date of Birth: 03/13/1976 Admit Type: Outpatient Age: 45 Room: [facility] OR ROOM 01 Gender: Male Note Status: Finalized Procedure:             Upper GI endoscopy Indications:           Dyspepsia Providers:             [name] MD, MD Referring MD:          Clinic [facility], MD (Referring MD) Medicines:             Propofol per Anesthesia Complications:         No immediate complications. Procedure:             Pre-Anesthesia Assessment:                        - Prior to the procedure, a History and Physical was                         performed, and patient medications and allergies were                         reviewed. The patient's tolerance of previous                         anesthesia was also reviewed. The risks and benefits                         of the procedure and the sedation options and risks                         were discussed with the patient. All questions were                         answered, and informed consent was obtained. Prior                         Anticoagulants: The patient has taken no previous                         anticoagulant or antiplatelet agents. ASA Grade                         Assessment: II - A patient with mild systemic disease.                         After reviewing the risks and benefits, the patient                         was deemed in satisfactory condition to undergo the                         procedure.                        After obtaining informed consent, the endoscope was  passed under direct vision. Throughout the procedure,                         the patient's blood pressure, pulse, and oxygen                         saturations were monitored continuously. The was                         introduced through the mouth, and advanced to  the                         second part of duodenum. The upper GI endoscopy was                         accomplished without difficulty. The patient tolerated                         the procedure well. Findings:      The examined esophagus was normal.      The entire examined stomach was normal. Biopsies were taken with a cold       forceps for histology.      The examined duodenum was normal. Biopsies were taken with a cold       forceps for histology. Impression:            - Normal esophagus.                        - Normal stomach. Biopsied.                        - Normal examined duodenum. Biopsied. Recommendation:        - Discharge patient to home.                        - Resume previous diet.                        - Continue present medications.                        - Await pathology results.                        - Perform a colonoscopy today. Procedure Code(s):     --- Professional ---                        [phone], Esophagogastroduodenoscopy, flexible,                         transoral; with biopsy, single or multiple Diagnosis Code(s):     --- Professional ---                        R10.13, Epigastric pain CPT copyright 2019 American Medical Association. All rights reserved. The codes documented in this report are preliminary and upon coder review may  be revised to meet current compliance requirements. [name] MD, MD 09/24/2020 9:00:50 AM This report has been signed electronically. Number of Addenda: 0 Note Initiated On: 09/24/2020 8:46 AM Total Procedure  Duration: 0 hours 3 minutes 17 seconds  Estimated Blood Loss:  Estimated blood loss: none.      [facility]

## 2020-09-24 NOTE — Transfer of Care (Signed)
Immediate Anesthesia Transfer of Care Note  Patient: Stanley Farley  Procedure(s) Performed: COLONOSCOPY WITH BIOPSY (N/A Rectum) ESOPHAGOGASTRODUODENOSCOPY (EGD) WITH BIOPSY (N/A Mouth)  Patient Location: PACU  Anesthesia Type: General  Level of Consciousness: awake, alert  and patient cooperative  Airway and Oxygen Therapy: Patient Spontanous Breathing and Patient connected to supplemental oxygen  Post-op Assessment: Post-op Vital signs reviewed, Patient's Cardiovascular Status Stable, Respiratory Function Stable, Patent Airway and No signs of Nausea or vomiting  Post-op Vital Signs: Reviewed and stable  Complications: No complications documented.

## 2020-09-24 NOTE — Op Note (Signed)
[facility] Patient Name: Stanley Farley Procedure Date: 09/24/2020 8:45 AM MRN: [id number] Account #: [id number] Date of Birth: April 12, 1976 Admit Type: Outpatient Age: 45 Room: [facility] OR ROOM 01 Gender: Male Note Status: Finalized Procedure:             Colonoscopy Indications:           Chronic diarrhea Providers:             [name] MD, MD Referring MD:          Clinic [facility], MD (Referring MD) Medicines:             Propofol per Anesthesia Complications:         No immediate complications. Procedure:             Pre-Anesthesia Assessment:                        - Prior to the procedure, a History and Physical was                         performed, and patient medications and allergies were                         reviewed. The patient's tolerance of previous                         anesthesia was also reviewed. The risks and benefits                         of the procedure and the sedation options and risks                         were discussed with the patient. All questions were                         answered, and informed consent was obtained. Prior                         Anticoagulants: The patient has taken no previous                         anticoagulant or antiplatelet agents. ASA Grade                         Assessment: II - A patient with mild systemic disease.                         After reviewing the risks and benefits, the patient                         was deemed in satisfactory condition to undergo the                         procedure.                        After obtaining informed consent, the colonoscope was  passed under direct vision. Throughout the procedure,                         the patient's blood pressure, pulse, and oxygen                         saturations were monitored continuously. The                         Colonoscope was introduced through the anus and                          advanced to the the terminal ileum. The colonoscopy                         was performed without difficulty. The patient                         tolerated the procedure well. The quality of the bowel                         preparation was excellent. Findings:      The perianal and digital rectal examinations were normal.      Localized inflammation, mild in severity and characterized by erythema       was found in the terminal ileum. Biopsies were taken with a cold forceps       for histology.      A localized area of mildly erythematous mucosa was found in the sigmoid       colon.      Random biopsies were obtained with cold forceps for histology. Impression:            - Ileitis. Biopsied.                        - Erythematous mucosa in the sigmoid colon.                        - Random biopsies were obtained. Recommendation:        - Discharge patient to home.                        - Resume previous diet.                        - Continue present medications.                        - Await pathology results. Procedure Code(s):     --- Professional ---                        [phone], Colonoscopy, flexible; with biopsy, single or                         multiple Diagnosis Code(s):     --- Professional ---                        K52.9, Noninfective gastroenteritis and colitis,  unspecified                        K63.89, Other specified diseases of intestine CPT copyright 2019 American Medical Association. All rights reserved. The codes documented in this report are preliminary and upon coder review may  be revised to meet current compliance requirements. [name] MD, MD 09/24/2020 9:16:00 AM This report has been signed electronically. Number of Addenda: 0 Note Initiated On: 09/24/2020 8:45 AM Scope Withdrawal Time: 0 hours 7 minutes 53 seconds  Total Procedure Duration: 0 hours 10 minutes 48 seconds  Estimated Blood Loss:   Estimated blood loss: none.      [facility]

## 2020-09-24 NOTE — H&P (Signed)
Stanley Minium, MD [facility] [address], [address] [place], [place] [address] Phone:[phone] Fax : [phone]  Primary Care Physician:  [facility] Primary Gastroenterologist:  Dr. Servando Snare  Pre-Procedure History & Physical: HPI:  Stanley Farley is a 45 y.o. male is here for an endoscopy and colonoscopy.   Past Medical History:  Diagnosis Date  . Diabetes mellitus without complication (HCC)   . Hypertension   . Stroke (HCC) 02/01/2017   Residual left sided weakness    Past Surgical History:  Procedure Laterality Date  . TEE WITHOUT CARDIOVERSION N/A 02/04/2017   Procedure: TRANSESOPHAGEAL ECHOCARDIOGRAM (TEE);  Surgeon: Antonieta Iba, MD;  Location: [facility] ORS;  Service: Cardiovascular;  Laterality: N/A;    Prior to Admission medications   Medication Sig Start Date End Date Taking? Authorizing Provider  amLODipine (NORVASC) 10 MG tablet Take 10 mg by mouth daily.   Yes [provider]  atorvastatin (LIPITOR) 40 MG tablet Take 1 tablet (40 mg total) by mouth daily at 6 PM. 02/06/17  Yes Angiulli, Mcarthur Rossetti, PA-C  CHROMIUM PO Take by mouth.   Yes [provider]  CINNAMON PO Take by mouth.   Yes [provider]  GOLDENSEAL PO Take by mouth.   Yes [provider]  hydrochlorothiazide (HYDRODIURIL) 25 MG tablet Take 25 mg by mouth daily.   Yes [provider]  JANUVIA 100 MG tablet TAKE 1 TABLET BY MOUTH ONCE DAILY FOR DIABETES. 08/06/20  Yes [provider]  lisinopril (ZESTRIL) 40 MG tablet 1 by mouth daily for high blood pressure 08/06/20  Yes [provider]  metFORMIN (GLUCOPHAGE) 500 MG tablet Take 2 tablet by mouth twice a day  for diabetes 08/06/20  Yes [provider]  omeprazole (PRILOSEC) 20 MG capsule TAKE 1 CAPSULE BY MOUTH ONCE DAILY FOR  REFLUX 08/08/20  Yes [provider]  aspirin 325 MG tablet Take 1 tablet (325 mg total) by mouth daily. Patient not taking: Reported on  09/13/2020 02/06/17   Charlton Amor, PA-C    Allergies as of 09/13/2020  . (No Known Allergies)    Family History  Problem Relation Age of Onset  . Diabetes Mother   . Hypertension Father   . Heart attack Father     Social History   Socioeconomic History  . Marital status: Married    Spouse name: Not on file  . Number of children: Not on file  . Years of education: Not on file  . Highest education level: Not on file  Occupational History  . Not on file  Tobacco Use  . Smoking status: Former Smoker    Years: 5.00    Types: Cigars    Quit date: 02/01/2017    Years since quitting: 3.6  . Smokeless tobacco: Never Used  Vaping Use  . Vaping Use: Never used  Substance and Sexual Activity  . Alcohol use: No  . Drug use: Yes    Types: Marijuana    Comment: Daily  . Sexual activity: Not on file  Other Topics Concern  . Not on file  Social History Narrative  . Not on file   Social Determinants of Health   Financial Resource Strain: Not on file  Food Insecurity: Not on file  Transportation Needs: Not on file  Physical Activity: Not on file  Stress: Not on file  Social Connections: Not on file  Intimate Partner Violence: Not on file    Review of Systems: See HPI, otherwise  negative ROS  Physical Exam: BP 138/79   Pulse 70   Temp (!) 97.5 F (36.4 C) (Temporal)   Resp 16   Ht 6\' 2"  (1.88 m)   Wt 85.3 kg   SpO2 99%   BMI 24.14 kg/m  General:   Alert,  pleasant and cooperative in NAD Head:  Normocephalic and atraumatic. Neck:  Supple; no masses or thyromegaly. Lungs:  Clear throughout to auscultation.    Heart:  Regular rate and rhythm. Abdomen:  Soft, nontender and nondistended. Normal bowel sounds, without guarding, and without rebound.   Neurologic:  Alert and  oriented x4;  grossly normal neurologically.  Impression/Plan: Stanley Farley is here for an endoscopy and colonoscopy to be performed for Nausea and diarrhea  Risks, benefits, limitations,  and alternatives regarding  endoscopy and colonoscopy have been reviewed with the patient.  Questions have been answered.  All parties agreeable.   Clovia Cuff, MD  09/24/2020, 8:44 AM

## 2020-09-24 NOTE — Anesthesia Preprocedure Evaluation (Signed)
Anesthesia Evaluation  Patient identified by MRN, date of birth, ID band Patient awake    Reviewed: Allergy & Precautions, H&P , NPO status , Patient's Chart, lab work & pertinent test results  Airway Mallampati: II  TM Distance: >3 FB Neck ROM: full    Dental no notable dental hx.    Pulmonary Patient abstained from smoking., former smoker,    Pulmonary exam normal breath sounds clear to auscultation       Cardiovascular hypertension, Normal cardiovascular exam Rhythm:regular Rate:Normal     Neuro/Psych CVA, Residual Symptoms    GI/Hepatic   Endo/Other  diabetes  Renal/GU      Musculoskeletal   Abdominal   Peds  Hematology   Anesthesia Other Findings   Reproductive/Obstetrics                             Anesthesia Physical Anesthesia Plan  ASA: III  Anesthesia Plan: General   Post-op Pain Management:    Induction: Intravenous  PONV Risk Score and Plan: 2 and Treatment may vary due to age or medical condition, Propofol infusion and TIVA  Airway Management Planned: Natural Airway  Additional Equipment:   Intra-op Plan:   Post-operative Plan:   Informed Consent: I have reviewed the patients History and Physical, chart, labs and discussed the procedure including the risks, benefits and alternatives for the proposed anesthesia with the patient or authorized representative who has indicated his/her understanding and acceptance.     Dental Advisory Given  Plan Discussed with: CRNA  Anesthesia Plan Comments:         Anesthesia Quick Evaluation

## 2020-09-24 NOTE — Anesthesia Postprocedure Evaluation (Signed)
Anesthesia Post Note  Patient: Stanley Farley  Procedure(s) Performed: COLONOSCOPY WITH BIOPSY (N/A Rectum) ESOPHAGOGASTRODUODENOSCOPY (EGD) WITH BIOPSY (N/A Mouth)     Patient location during evaluation: PACU Anesthesia Type: General Level of consciousness: awake and alert and oriented Pain management: satisfactory to patient Vital Signs Assessment: post-procedure vital signs reviewed and stable Respiratory status: spontaneous breathing, nonlabored ventilation and respiratory function stable Cardiovascular status: blood pressure returned to baseline and stable Postop Assessment: Adequate PO intake and No signs of nausea or vomiting Anesthetic complications: no   No complications documented.  [place]

## 2020-09-24 NOTE — Anesthesia Procedure Notes (Signed)
Date/Time: 09/24/2020 8:52 AM Performed by: Maree Krabbe, CRNA Pre-anesthesia Checklist: Patient identified, Emergency Drugs available, Suction available, Timeout performed and Patient being monitored Patient Re-evaluated:Patient Re-evaluated prior to induction Oxygen Delivery Method: Nasal cannula Placement Confirmation: positive ETCO2

## 2020-09-25 ENCOUNTER — Encounter: Payer: Self-pay | Admitting: Gastroenterology

## 2020-09-26 LAB — SURGICAL PATHOLOGY

## 2020-09-28 ENCOUNTER — Encounter: Payer: Medicaid Other | Attending: Gastroenterology

## 2020-10-02 ENCOUNTER — Encounter: Payer: Self-pay | Admitting: Gastroenterology

## 2020-11-01 ENCOUNTER — Encounter: Payer: Self-pay | Admitting: Podiatry

## 2020-11-01 ENCOUNTER — Ambulatory Visit (INDEPENDENT_AMBULATORY_CARE_PROVIDER_SITE_OTHER): Payer: Medicaid Other | Admitting: Podiatry

## 2020-11-01 ENCOUNTER — Other Ambulatory Visit: Payer: Self-pay

## 2020-11-01 DIAGNOSIS — M216X1 Other acquired deformities of right foot: Secondary | ICD-10-CM

## 2020-11-01 DIAGNOSIS — L309 Dermatitis, unspecified: Secondary | ICD-10-CM | POA: Insufficient documentation

## 2020-11-01 DIAGNOSIS — M21372 Foot drop, left foot: Secondary | ICD-10-CM | POA: Insufficient documentation

## 2020-11-01 DIAGNOSIS — L84 Corns and callosities: Secondary | ICD-10-CM | POA: Insufficient documentation

## 2020-11-01 NOTE — Progress Notes (Signed)
This patient presents to the office for 2 concerns.  He says he was referred to this office by his medical doctor.  He says he has developed a skin lesion at the top of base of his first and second toes left foot.  He says there is no pain or discomfort or drainage from the site.  This skin area appears to be peeling at present.  He also is a diabetic and has a painful skin lesion noted on the outside ball of his right foot.  He says that he has had a stroke which causes him to make his right side his major side for walking.  He says he had a stroke for which she has developed a foot drop left foot.  This has altered his gait causing increased pressure on the outside of his right foot.  He says he trims the callus himself even though the callus is basically pain-free presently.  Vascular  Dorsalis pedis and posterior tibial pulses are weakly  palpable  B/L.  Capillary return  WNL.  Temperature gradient is  WNL.  Skin turgor  WNL  Sensorium  Senn Weinstein monofilament wire  WNL. Normal tactile sensation.  Nail Exam  Patient has normal nails with no evidence of bacterial or fungal infection.  Orthopedic  Exam  Muscle tone and muscle strength  WNL.  No limitations of motion feet  B/L.  No crepitus or joint effusion noted.  Foot type is unremarkable and digits show no abnormalities.  Bony prominences are unremarkable.  Plantar flexed fifth metatarsal right foot.  Adducto varus fifth digit right foot.  Skin  No open lesions.  Normal skin texture and turgor. Dark peeling skin lesion proximal to 1,2 digit left foot.  Dermatitis left foot.  Callus secondary plantar flexed fifth metatarsal head right foot.  IE.  Discussed conditions with this patient.  Told him to apply cortaid to the lesion left foot.  Debrided plantar callus with right foot with # 15 blade and dremel tool.  Discussed surgical correction if the fifth metatarsal head worsens..  As he was leaving I watched his gaiyt and noted foot drop left  foot.  He was told he did not need an AFO.  Told him to return to the office if this condition worsens.   Helane Gunther DPM

## 2021-01-09 ENCOUNTER — Encounter: Payer: Self-pay | Admitting: Emergency Medicine

## 2021-01-09 ENCOUNTER — Emergency Department
Admission: EM | Admit: 2021-01-09 | Discharge: 2021-01-09 | Disposition: A | Discharge status: Home / Self Care | Payer: Medicaid Other | Attending: Emergency Medicine | Admitting: Emergency Medicine

## 2021-01-09 ENCOUNTER — Other Ambulatory Visit: Payer: Self-pay

## 2021-01-09 DIAGNOSIS — I10 Essential (primary) hypertension: Secondary | ICD-10-CM | POA: Insufficient documentation

## 2021-01-09 DIAGNOSIS — Z87891 Personal history of nicotine dependence: Secondary | ICD-10-CM | POA: Diagnosis not present

## 2021-01-09 DIAGNOSIS — F4325 Adjustment disorder with mixed disturbance of emotions and conduct: Secondary | ICD-10-CM | POA: Diagnosis not present

## 2021-01-09 DIAGNOSIS — Y900 Blood alcohol level of less than 20 mg/100 ml: Secondary | ICD-10-CM | POA: Diagnosis not present

## 2021-01-09 DIAGNOSIS — E1142 Type 2 diabetes mellitus with diabetic polyneuropathy: Secondary | ICD-10-CM | POA: Insufficient documentation

## 2021-01-09 DIAGNOSIS — Z7984 Long term (current) use of oral hypoglycemic drugs: Secondary | ICD-10-CM | POA: Insufficient documentation

## 2021-01-09 DIAGNOSIS — Z7982 Long term (current) use of aspirin: Secondary | ICD-10-CM | POA: Diagnosis not present

## 2021-01-09 DIAGNOSIS — F430 Acute stress reaction: Secondary | ICD-10-CM

## 2021-01-09 DIAGNOSIS — Z79899 Other long term (current) drug therapy: Secondary | ICD-10-CM | POA: Diagnosis not present

## 2021-01-09 DIAGNOSIS — F32A Depression, unspecified: Secondary | ICD-10-CM | POA: Diagnosis present

## 2021-01-09 LAB — URINALYSIS, COMPLETE (UACMP) WITH MICROSCOPIC
Bacteria, UA: NONE SEEN
Bilirubin Urine: NEGATIVE
Glucose, UA: NEGATIVE mg/dL
Ketones, ur: NEGATIVE mg/dL
Leukocytes,Ua: NEGATIVE
Nitrite: NEGATIVE
Protein, ur: 30 mg/dL — AB
Specific Gravity, Urine: 1.01 (ref 1.005–1.030)
pH: 5 (ref 5.0–8.0)

## 2021-01-09 LAB — URINE DRUG SCREEN, QUALITATIVE (ARMC ONLY)
Amphetamines, Ur Screen: NOT DETECTED
Barbiturates, Ur Screen: NOT DETECTED
Benzodiazepine, Ur Scrn: NOT DETECTED
Cannabinoid 50 Ng, Ur ~~LOC~~: POSITIVE — AB
Cocaine Metabolite,Ur ~~LOC~~: NOT DETECTED
MDMA (Ecstasy)Ur Screen: NOT DETECTED
Methadone Scn, Ur: NOT DETECTED
Opiate, Ur Screen: NOT DETECTED
Phencyclidine (PCP) Ur S: NOT DETECTED
Tricyclic, Ur Screen: NOT DETECTED

## 2021-01-09 LAB — CBC
HCT: 33.5 % — ABNORMAL LOW (ref 39.0–52.0)
Hemoglobin: 11.3 g/dL — ABNORMAL LOW (ref 13.0–17.0)
MCH: 32.3 pg (ref 26.0–34.0)
MCHC: 33.7 g/dL (ref 30.0–36.0)
MCV: 95.7 fL (ref 80.0–100.0)
Platelets: 208 10*3/uL (ref 150–400)
RBC: 3.5 MIL/uL — ABNORMAL LOW (ref 4.22–5.81)
RDW: 13.2 % (ref 11.5–15.5)
WBC: 8.8 10*3/uL (ref 4.0–10.5)
nRBC: 0 % (ref 0.0–0.2)

## 2021-01-09 LAB — ETHANOL: Alcohol, Ethyl (B): <10 mg/dL (ref <10)

## 2021-01-09 LAB — COMPREHENSIVE METABOLIC PANEL
ALT: 28 U/L (ref 0–44)
AST: 21 U/L (ref 15–41)
Albumin: 4 g/dL (ref 3.5–5.0)
Alkaline Phosphatase: 60 U/L (ref 38–126)
Anion gap: 6 (ref 5–15)
BUN: 33 mg/dL — ABNORMAL HIGH (ref 6–20)
CO2: 25 mmol/L (ref 22–32)
Calcium: 9 mg/dL (ref 8.9–10.3)
Chloride: 108 mmol/L (ref 98–111)
Creatinine, Ser: 1.45 mg/dL — ABNORMAL HIGH (ref 0.61–1.24)
GFR, Estimated: >60 mL/min (ref >60)
Glucose, Bld: 179 mg/dL — ABNORMAL HIGH (ref 70–99)
Potassium: 5.3 mmol/L — ABNORMAL HIGH (ref 3.5–5.1)
Sodium: 139 mmol/L (ref 135–145)
Total Bilirubin: 0.5 mg/dL (ref 0.3–1.2)
Total Protein: 7.6 g/dL (ref 6.5–8.1)

## 2021-01-09 LAB — SALICYLATE LEVEL: Salicylate Lvl: <7 mg/dL — ABNORMAL LOW (ref 7.0–30.0)

## 2021-01-09 LAB — ACETAMINOPHEN LEVEL: Acetaminophen (Tylenol), Serum: <10 ug/mL — ABNORMAL LOW (ref 10–30)

## 2021-01-09 NOTE — ED Notes (Signed)
[facility] PAPERS  RESCINDED  PER  JAMIE  LORD  NP   

## 2021-01-09 NOTE — ED Provider Notes (Signed)
[facility] Emergency Department Provider Note  __________________________   Event Date/Time   First MD Initiated Contact with Patient 01/09/21 1024     (approximate)  I have reviewed the triage vital signs and the nursing notes.   HISTORY  Chief Complaint Suicidal    HPI Stanley Farley is a 45 y.o. male patient comes in under commitment.  Allegedly he had had a gun in his mouth yesterday because he was depressed.  Patient denies this.  Patient says he was feeling depressed yesterday but is not today.  He is not suicidal he insists.  Psych comes to see him and he tells him the same thing.  Psych talks to family his wife is not concerned about him hurting himself.  Also he has no guns in the house currently they have all gone to his father's house and he cannot get to them.  Patient will be discharged with follow-up with mental health.  He contracts for safety.  Past medical history as below.  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 [facility])    Poorly controlled type 2 diabetes  mellitus with peripheral neuropathy (HCC)    Benign essential HTN    Tobacco abuse    Marijuana abuse    Hyperlipidemia    H/O medication noncompliance    Acute CVA (cerebrovascular accident) (HCC) 02/01/2017    Past Surgical History:  Procedure Laterality Date   COLONOSCOPY WITH PROPOFOL N/A 09/24/2020   Procedure: COLONOSCOPY WITH BIOPSY;  Surgeon: [name], MD;  Location: [facility];  Service: Endoscopy;  Laterality: N/A;   ESOPHAGOGASTRODUODENOSCOPY (EGD) WITH PROPOFOL N/A 09/24/2020   Procedure: ESOPHAGOGASTRODUODENOSCOPY (EGD) WITH BIOPSY;  Surgeon: [name], MD;  Location: [facility];  Service: Endoscopy;  Laterality: N/A;  Diabetic - oral meds   TEE WITHOUT CARDIOVERSION N/A 02/04/2017   Procedure: TRANSESOPHAGEAL ECHOCARDIOGRAM (TEE);  Surgeon: Antonieta Iba, MD;  Location: [facility] ORS;  Service: Cardiovascular;  Laterality: N/A;    Prior to Admission medications   Medication Sig Start Date End Date Taking? Authorizing Provider  amLODipine (NORVASC) 10 MG tablet Take 10 mg by mouth daily.    [provider]  aspirin 325 MG tablet Take 1 tablet (325 mg total) by mouth daily. 02/06/17   Angiulli, Mcarthur Rossetti, PA-C  atorvastatin (LIPITOR) 40 MG tablet Take 1 tablet (40 mg total) by mouth daily at 6 PM. 02/06/17   Angiulli, Mcarthur Rossetti, PA-C  CINNAMON PO Take by mouth.    [provider]  hydrochlorothiazide (HYDRODIURIL) 25 MG tablet Take 25 mg by mouth daily.    [provider]  JANUVIA 100 MG tablet TAKE 1 TABLET BY MOUTH ONCE DAILY FOR DIABETES. 08/06/20   [provider]  lisinopril (ZESTRIL) 40 MG tablet 1 by mouth daily for high blood pressure 08/06/20   [provider]  metFORMIN (GLUCOPHAGE) 500 MG tablet Take 2 tablet by mouth twice a day  for diabetes 08/06/20   [provider]  omeprazole (PRILOSEC) 20 MG capsule TAKE 1 CAPSULE BY MOUTH ONCE DAILY FOR  REFLUX 08/08/20   [provider]     Allergies Patient has no known allergies.  Family History  Problem Relation Age of Onset   Diabetes Mother    Hypertension Father    Heart attack Father     Social History Social History   Tobacco Use   Smoking status: Former    Pack years: 0.00    Types: Cigars    Quit date: 02/01/2017    Years since quitting: 3.9   Smokeless tobacco: Never  Vaping Use   Vaping Use: Never used  Substance Use Topics   Alcohol use: No   Drug use: Yes    Types: Marijuana    Comment: Daily    Review of Systems  Constitutional: No fever/chills Eyes: No visual changes. ENT: No sore throat. Cardiovascular: Denies chest pain. Respiratory: Denies shortness of breath. Gastrointestinal: No abdominal pain.  No nausea, no vomiting.  No diarrhea.  No constipation. Genitourinary: Negative for dysuria. Musculoskeletal: Negative for back pain. Skin: Negative for rash. Neurological: Negative for headaches, focal weakness ____________________________________________   PHYSICAL EXAM:  VITAL SIGNS: ED Triage Vitals  Enc Vitals Group     BP 01/09/21 0955 (!) 143/79     Pulse Rate 01/09/21 0955 89     Resp 01/09/21 0955 18     Temp 01/09/21 0955 98.3 F (36.8 C)     Temp Source 01/09/21 0955 Oral     SpO2 01/09/21 0955 99 %     Weight 01/09/21 0958 188 lb 0.8 oz (85.3 kg)     Height 01/09/21 0958 6\' 2"  (1.88 m)     Head Circumference --      Peak Flow --      Pain Score 01/09/21 0957 0     Pain Loc --      Pain Edu? --      Excl. in GC? --     Constitutional: Alert and oriented. Well appearing and in no acute distress. Eyes: Conjunctivae are normal.  Head: Atraumatic. Nose: No congestion/rhinnorhea. Mouth/Throat: Mucous membranes are moist.  Oropharynx non-erythematous. Neck: No stridor.  Cardiovascular: Normal rate, regular rhythm. Grossly normal heart sounds.  Good peripheral circulation. Respiratory: Normal respiratory effort.  No retractions. Lungs CTAB. Gastrointestinal:  Soft and nontender. No distention. No abdominal bruits. No CVA tenderness. Neurologic:  Normal speech and language. No gross focal neurologic deficits are appreciated.  Skin:  Skin is warm, dry and intact. No rash noted.   ____________________________________________   LABS (all labs ordered are listed, but only abnormal results are displayed)  Labs Reviewed  COMPREHENSIVE METABOLIC PANEL - Abnormal; Notable for the following components:      Result Value   Potassium 5.3 (*)    Glucose, Bld 179 (*)    BUN 33 (*)    Creatinine, Ser 1.45 (*)    All other components within normal limits  SALICYLATE LEVEL - Abnormal; Notable for the following  components:   Salicylate Lvl <7.0 (*)    All other components within normal limits  ACETAMINOPHEN LEVEL - Abnormal; Notable for the following components:   Acetaminophen (Tylenol), Serum <10 (*)    All other components within normal limits  CBC - Abnormal; Notable for the following components:   RBC 3.50 (*)    Hemoglobin 11.3 (*)    HCT 33.5 (*)    All other components within normal limits  URINE DRUG SCREEN, QUALITATIVE ([facility] ONLY) - Abnormal; Notable for the following components:   Cannabinoid 50 Ng, Ur Rennert POSITIVE (*)    All other components within normal limits  URINALYSIS, COMPLETE (UACMP) WITH MICROSCOPIC - Abnormal; Notable for the following components:   Color, Urine STRAW (*)    APPearance CLEAR (*)    Hgb urine dipstick SMALL (*)    Protein, ur 30 (*)    All other components within normal limits  ETHANOL   ____________________________________________  EKG   ____________________________________________  RADIOLOGY Jill Poling, personally viewed and evaluated these images (plain radiographs) as part of my medical decision making, as well as reviewing the written report by the radiologist.  ED MD interpretation:   Official radiology report(s): No results  found.  ____________________________________________   PROCEDURES  Procedure(s) performed (including Critical Care):  Procedures   ____________________________________________   INITIAL IMPRESSION / ASSESSMENT AND PLAN / ED COURSE  Patient seen by psychiatry his wife confirms his story.  Psychiatry discontinues the IVC.  I do not disagree with this.              ____________________________________________   FINAL CLINICAL IMPRESSION(S) / ED DIAGNOSES  Final diagnoses:  Stress reaction causing mixed disturbance of emotion and conduct     ED Discharge Orders          Ordered    Increase activity slowly        01/09/21 1054    Diet - low sodium heart healthy        01/09/21 1054    Discharge instructions       Comments: Follow up with outpatient resources   01/09/21 1054             Note:  This document was prepared using Dragon voice recognition software and may include unintentional dictation errors.    Arnaldo Natal, MD 01/09/21 1140

## 2021-01-09 NOTE — BH Assessment (Signed)
Comprehensive Clinical Assessment (CCA) Note  01/09/2021 Stanley Farley [id number]  Chief Complaint:  Chief Complaint  Patient presents with   Suicidal   Visit Diagnosis: Adjustment Disorder   Stanley Farley is a 45 year old male who presents to the ER after he was placed under IVC by his PCP doctor. Per the patient, he was having a bad day and said some things that may have led them to believe he was going to harm his self. Per IVC, the patient refused to wait and be seen by a mental health provide and develop a safety plan. Thus, the provider petitioned for him to be under IVC on yesterday (01/08/2021). He was picked up by law enforcement today (01/09/2021) and brought to the ER. Patient provided protective factors and reasons why he would not end his life, without being asked. 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 He and his wife are relationship is going well and his grandson lives with him. Even thought he has limited income, he has insurance to help pay his bills when he is not working.  Per the report of the patient's wife ([name]), she has no concerns about him ending his life or doing anything to harm his self. She further reports, prior to going to see his doctor, he had an argument with is mother. Wife believes, that was what caused him to become upset and feeling "down" because of it.   During the interview, the patient was calm, cooperative and pleasant. He was able to provide appropriate answers to the questions. He denies involvement with the legal system. He admits to the use of cannabis but denies any other mind alter substances.   CCA Screening, Triage and Referral (STR)  Patient Reported Information How did you hear about [place]? Other (Comment)  What Is the Reason for Your Visit/Call Today? PCP had concerns for the patient harming his self.  How Long Has This Been Causing You Problems? <Week  What Do You Feel Would Help You the Most Today?  Treatment for Depression or other mood problem   Have You Recently Had Any Thoughts About Hurting Yourself? No  Are You Planning to Commit Suicide/Harm Yourself At This time? No   Have you Recently Had Thoughts About Hurting Someone Karolee Ohs? No  Are You Planning to Harm Someone at This Time? No  Explanation: No data recorded  Have You Used Any Alcohol or Drugs in the Past 24 Hours? No  How Long Ago Did You Use Drugs or Alcohol? No data recorded What Did You Use and How Much? No data recorded  Do You Currently Have a Therapist/Psychiatrist? No  Name of Therapist/Psychiatrist: No data recorded  Have You Been Recently Discharged From Any Office Practice or Programs? No  Explanation of Discharge From Practice/Program: No data recorded    CCA Screening Triage Referral Assessment Type of Contact: Face-to-Face  Telemedicine Service Delivery:   Is this Initial or Reassessment? No data recorded Date Telepsych consult ordered in [facility]:  No data recorded Time Telepsych consult ordered in [facility]:  No data recorded Location of Assessment: [facility] ED  Provider Location: [facility] ED   Collateral Involvement: Wife ([name])   Does Patient Have a Automotive engineer Guardian? No data recorded Name and Contact of Legal Guardian: No data recorded If Minor and Not Living with Parent(s), Who has Custody? No data recorded Is CPS involved or ever been involved? Never  Is APS involved or ever been involved? Never   Patient Determined  To Be At Risk for Harm To Self or Others Based on Review of Patient Reported Information or Presenting Complaint? No  Method: No data recorded Availability of Means: No data recorded Intent: No data recorded Notification Required: No data recorded Additional Information for Danger to Others Potential: No data recorded Additional Comments for Danger to Others Potential: No data recorded Are There Guns or Other Weapons in Your Home? No data recorded Types of  Guns/Weapons: No data recorded Are These Weapons Safely Secured?                            No data recorded Who Could Verify You Are Able To Have These Secured: No data recorded Do You Have any Outstanding Charges, Pending Court Dates, Parole/Probation? No data recorded Contacted To Inform of Risk of Harm To Self or Others: No data recorded   Does Patient Present under Involuntary Commitment? Yes  IVC Papers Initial File Date: 01/09/21   [place] of Residence: [place]   Patient Currently Receiving the Following Services: Not Receiving Services   Determination of Need: Emergent (2 hours)   Options For Referral: ED Visit     CCA Biopsychosocial Patient Reported Schizophrenia/Schizoaffective Diagnosis in Past: No   Strengths: Have support, have hobbies and some insight into his problems   Mental Health Symptoms Depression:   None   Duration of Depressive symptoms:    Mania:   None   Anxiety:    None   Psychosis:   None   Duration of Psychotic symptoms:    Trauma:   None   Obsessions:   None   Compulsions:   None   Inattention:   None   Hyperactivity/Impulsivity:   None   Oppositional/Defiant Behaviors:   None   Emotional Irregularity:   None   Other Mood/Personality Symptoms:  No data recorded   Mental Status Exam Appearance and self-care  Stature:   Average   Weight:   Average weight   Clothing:   Neat/clean; Age-appropriate   Grooming:   Normal   Cosmetic use:   None   Posture/gait:   Normal   Motor activity:   -- (Within normal range)   Sensorium  Attention:   Normal   Concentration:   Normal   Orientation:   X5   Recall/memory:   Normal   Affect and Mood  Affect:   Full Range   Mood:   Other (Comment)   Relating  Eye contact:   Normal   Facial expression:   Responsive   Attitude toward examiner:   Cooperative   Thought and Language  Speech flow:  Normal   Thought content:   Appropriate to  Mood and Circumstances   Preoccupation:   None   Hallucinations:   None   Organization:  No data recorded  [employer] of Knowledge:   Average   Intelligence:   Average   Abstraction:   Normal   Judgement:   Normal   Reality Testing:   Adequate   Insight:   Good   Decision Making:   Normal   Social Functioning  Social Maturity:   Responsible   Social Judgement:   Normal   Stress  Stressors:   Relationship   Coping Ability:   Normal   Skill Deficits:   None   Supports:   Friends/Service system     Religion: Religion/Spirituality Are You A Religious Person?: No  Leisure/Recreation: Leisure / Recreation  Do You Have Hobbies?: No  Exercise/Diet: Exercise/Diet Do You Exercise?: No Have You Gained or Lost A Significant Amount of Weight in the Past Six Months?: No Do You Follow a Special Diet?: No Do You Have Any Trouble Sleeping?: No   CCA Employment/Education Employment/Work Situation: Employment / Work Situation Employment Situation: Employed Work Stressors: None reported Patient's Job has Been Impacted by Current Illness: No Has Patient ever Been in Equities traderthe Military?: No  Education: Education Is Patient Currently Attending School?: No Did You Product managerAttend College?: Yes 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 Did You Have An Individualized Education Program (IIEP): No Did You Have Any Difficulty At Progress EnergySchool?: No Patient's Education Has Been Impacted by Current Illness: No   CCA Family/Childhood History Family and Relationship History: Family history Marital status: Married Number of Years Married: 23 What types of issues is patient dealing with in the relationship?: None reported Additional relationship information: None reported Does patient have children?: Yes How many children?: 2 How is patient's relationship with their children?: States he is good  Childhood History:  Childhood History By whom was/is  the patient raised?: Both parents Did patient suffer any verbal/emotional/physical/sexual abuse as a child?: No Did patient suffer from severe childhood neglect?: No Has patient ever been sexually abused/assaulted/raped as an adolescent or adult?: No Was the patient ever a victim of a crime or a disaster?: No Witnessed domestic violence?: No Has patient been affected by domestic violence as an adult?: No  Child/Adolescent Assessment:     CCA Substance Use Alcohol/Drug Use: Alcohol / Drug Use Pain Medications: See PTA Prescriptions: See PTA Over the Counter: See PTA History of alcohol / drug use?: Yes Longest period of sobriety (when/how long): Unable to quantify Substance #1 Name of Substance 1: Cannabis 1 - Frequency: Unable to quantify                       ASAM's:  Six Dimensions of Multidimensional Assessment  Dimension 1:  Acute Intoxication and/or Withdrawal Potential:      Dimension 2:  Biomedical Conditions and Complications:      Dimension 3:  Emotional, Behavioral, or Cognitive Conditions and Complications:     Dimension 4:  Readiness to Change:     Dimension 5:  Relapse, Continued use, or Continued Problem Potential:     Dimension 6:  Recovery/Living Environment:     ASAM Severity Score:    ASAM Recommended Level of Treatment:     Substance use Disorder (SUD)    Recommendations for Services/Supports/Treatments:    Discharge Disposition:    DSM5 Diagnoses: Patient Active Problem List   Diagnosis Date Noted   Stress reaction causing mixed disturbance of emotion and conduct 01/09/2021   Dermatitis 11/01/2020   Plantar flexed metatarsal bone of right foot 11/01/2020   Callus 11/01/2020   Foot drop, left foot 11/01/2020   Diarrhea    Non-intractable vomiting    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  (HCC)    Poorly controlled type 2 diabetes mellitus with peripheral neuropathy (HCC)    Benign essential HTN    Tobacco abuse    Marijuana abuse    Hyperlipidemia    H/O medication noncompliance    Acute CVA (cerebrovascular accident) (HCC) 02/01/2017     Referrals to Alternative Service(s): Referred to Alternative Service(s):   Place:   Date:  Time:    Referred to Alternative Service(s):   Place:   Date:   Time:    Referred to Alternative Service(s):   Place:   Date:   Time:    Referred to Alternative Service(s):   Place:   Date:   Time:     Lilyan Gilford MS, LCAS, [facility], [facility] Therapeutic Triage Specialist 01/09/2021 10:56 AM

## 2021-01-09 NOTE — ED Triage Notes (Signed)
Pt comes into the ED via Sheriff's Deputy under IVC.  Pt told his PCP yesterday that he had suicidal thoughts and had a gun to his mouth yesterday.  Pt denies any current thoughts of SI, but states he did tell his MD this information yesterday.  Pt denies ever having a gun to his mouth.  PT currently in NAD.  PT has h/o depression especially post stroke where he is now having a hard time getting employment.  Pt presents laughing in triage with staff and states he has never undergone Behavioral evaluations before.

## 2021-01-09 NOTE — ED Notes (Signed)
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

## 2021-01-09 NOTE — ED Notes (Signed)
Pt belonging bag 1/1 returned to patient at time of discharge. E-signature not working at this time. Pt verbalized understanding of D/C instructions, prescriptions and follow up care with no further questions at this time. Pt in NAD and ambulatory at time of D/C.

## 2021-01-09 NOTE — Consult Note (Signed)
[facility] ED Discharge  01/09/2021 10:53 AM TALIB HEADLEY  MRN:  [id number]  Method of visit?: Face to Face   Principal Problem: Stress reaction causing mixed disturbance of emotion and conduct Discharge Diagnoses: Principal Problem:   Stress reaction causing mixed disturbance of emotion and conduct   Subjective: "I had a bad day yesterday.  I was just venting."  45 yo male who presented to the ED under IVC after voicing suicidal ideations with his PCP yesterday.  He is calm and cooperative.  He reports he had a bad day and at his doctor's appointment said something about it.  The provider got their mental health person and the client discussed his issues and made a comment he should shoot himself.  They took out papers and he brought in today.  He denies suicidal ideations, past attempts and hospitalizations.  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  No homicidal ideations, hallucinations, or mania symptoms.  He does report using cannabis at times, no other substance abuse.  Permission given to speak to his wife, Sandip Power,  of 23 years.  She states, "I don't have any safety concerns at all.  He was truly venting.  He was upset after an argument with his mother on the phone and vented."  She states his guns are in a gun safe at his father's house under lock with his father having the keys on his key ring, no patient access. Psychiatrically cleared for discharge.  Total Time spent with patient: 1 hour  Past Psychiatric History: none  Past Medical History:  Past Medical History:  Diagnosis Date   Diabetes mellitus without complication (HCC)    Hypertension    Stroke (HCC) 02/01/2017   Residual left sided weakness    Past Surgical History:  Procedure Laterality Date   COLONOSCOPY WITH PROPOFOL N/A 09/24/2020   Procedure: COLONOSCOPY WITH BIOPSY;  Surgeon: [name], MD;   Location: [facility];  Service: Endoscopy;  Laterality: N/A;   ESOPHAGOGASTRODUODENOSCOPY (EGD) WITH PROPOFOL N/A 09/24/2020   Procedure: ESOPHAGOGASTRODUODENOSCOPY (EGD) WITH BIOPSY;  Surgeon: [name], MD;  Location: [facility];  Service: Endoscopy;  Laterality: N/A;  Diabetic - oral meds   TEE WITHOUT CARDIOVERSION N/A 02/04/2017   Procedure: TRANSESOPHAGEAL ECHOCARDIOGRAM (TEE);  Surgeon: Antonieta Iba, MD;  Location: [facility];  Service: Cardiovascular;  Laterality: N/A;   Family History:  Family History  Problem Relation Age of Onset   Diabetes Mother    Hypertension Father    Heart attack Father    Family Psychiatric  History: none Social History:  Social History   Substance and Sexual Activity  Alcohol Use No     Social History   Substance and Sexual Activity  Drug Use Yes   Types: Marijuana   Comment: Daily    Social History   Socioeconomic History   Marital status: Married    Spouse name: Not on file   Number of children: Not on file   Years of education: Not on file   Highest education level: Not on file  Occupational History   Not on file  Tobacco Use   Smoking status: Former    Pack years: 0.00    Types: Cigars    Quit date: 02/01/2017    Years since quitting: 3.9   Smokeless tobacco: Never  Vaping Use   Vaping Use: Never  used  Substance and Sexual Activity   Alcohol use: No   Drug use: Yes    Types: Marijuana    Comment: Daily   Sexual activity: Not on file  Other Topics Concern   Not on file  Social History Narrative   Not on file   Social Determinants of Health   Financial Resource Strain: Not on file  Food Insecurity: Not on file  Transportation Needs: Not on file  Physical Activity: Not on file  Stress: Not on file  Social Connections: Not on file    Tobacco Cessation:  N/A, patient does not currently use tobacco products  Current Medications: No current facility-administered medications for this encounter.    Current Outpatient Medications  Medication Sig Dispense Refill   amLODipine (NORVASC) 10 MG tablet Take 10 mg by mouth daily.     aspirin 325 MG tablet Take 1 tablet (325 mg total) by mouth daily. 30 tablet 2   atorvastatin (LIPITOR) 40 MG tablet Take 1 tablet (40 mg total) by mouth daily at 6 PM. 30 tablet 2   CHROMIUM PO Take by mouth.     CINNAMON PO Take by mouth.     GOLDENSEAL PO Take by mouth.     hydrochlorothiazide (HYDRODIURIL) 25 MG tablet Take 25 mg by mouth daily.     JANUVIA 100 MG tablet TAKE 1 TABLET BY MOUTH ONCE DAILY FOR DIABETES.     lisinopril (ZESTRIL) 40 MG tablet 1 by mouth daily for high blood pressure     metFORMIN (GLUCOPHAGE) 500 MG tablet Take 2 tablet by mouth twice a day  for diabetes     omeprazole (PRILOSEC) 20 MG capsule TAKE 1 CAPSULE BY MOUTH ONCE DAILY FOR  REFLUX     PTA Medications: (Not in a hospital admission)   Musculoskeletal: Strength & Muscle Tone: within normal limits Gait & Station: normal Patient leans: N/A  Psychiatric Specialty Exam: Psychiatric Specialty Exam: Physical Exam Vitals and nursing note reviewed.  Constitutional:      Appearance: Normal appearance.  HENT:     Head: Normocephalic.     Nose: Nose normal.  Pulmonary:     Effort: Pulmonary effort is normal.  Musculoskeletal:        General: Normal range of motion.     Cervical back: Normal range of motion.  Neurological:     General: No focal deficit present.     Mental Status: He is alert and oriented to person, place, and time.  Psychiatric:        Attention and Perception: Attention and perception normal.        Mood and Affect: Mood and affect normal.        Speech: Speech normal.        Behavior: Behavior normal. Behavior is cooperative.        Thought Content: Thought content normal.        Cognition and Memory: Cognition and memory normal.        Judgment: Judgment normal.    Review of Systems  All other systems reviewed and are negative.  Blood  pressure (!) 143/79, pulse 89, temperature 98.3 F (36.8 C), temperature source Oral, resp. rate 18, height 6\' 2"  (1.88 m), weight 85.3 kg, SpO2 99 %.Body mass index is 24.14 kg/m.  General Appearance: Casual  Eye Contact:  Good  Speech:  Normal Rate  Volume:  Normal  Mood:  Euthymic  Affect:  Congruent  Thought Process:  Coherent and Descriptions of Associations: Intact  Orientation:  Full (Time, Place, and Person)  Thought Content:  WDL and Logical  Suicidal Thoughts:  No  Homicidal Thoughts:  No  Memory:  Immediate;   Good Recent;   Good Remote;   Good  Judgement:  Fair  Insight:  Good  Psychomotor Activity:  Normal  Concentration:  Concentration: Good and Attention Span: Good  Recall:  Good  Fund of Knowledge:  Good  Language:  Good  Akathisia:  No  Handed:  Right  AIMS (if indicated):     Assets:  Housing Leisure Time Physical Health Resilience Social Support  ADL's:  Intact  Cognition:  WNL  Sleep:         Physical Exam: Physical Exam Vitals and nursing note reviewed.  Constitutional:      Appearance: Normal appearance.  HENT:     Head: Normocephalic.     Nose: Nose normal.  Pulmonary:     Effort: Pulmonary effort is normal.  Musculoskeletal:        General: Normal range of motion.     Cervical back: Normal range of motion.  Neurological:     General: No focal deficit present.     Mental Status: He is alert and oriented to person, place, and time.  Psychiatric:        Attention and Perception: Attention and perception normal.        Mood and Affect: Mood and affect normal.        Speech: Speech normal.        Behavior: Behavior normal. Behavior is cooperative.        Thought Content: Thought content normal.        Cognition and Memory: Cognition and memory normal.        Judgment: Judgment normal.   Review of Systems  All other systems reviewed and are negative. Blood pressure (!) 143/79, pulse 89, temperature 98.3 F (36.8 C), temperature  source Oral, resp. rate 18, height 6\' 2"  (1.88 m), weight 85.3 kg, SpO2 99 %. Body mass index is 24.14 kg/m.   Demographic Factors:  Male  Loss Factors: NA  Historical Factors: NA  Risk Reduction Factors:   Responsible for children under 27 years of age, Sense of responsibility to family, Employed, Living with another person, especially a relative, and Positive social support  Continued Clinical Symptoms:  None  Cognitive Features That Contribute To Risk:  None    Suicide Risk:  Minimal: No identifiable suicidal ideation.  Patients presenting with no risk factors but with morbid ruminations; may be classified as minimal risk based on the severity of the depressive symptoms    Plan Of Care/Follow-up recommendations:  Stress reaction causing mixed disturbance of emotions and conduct: -Follow up with [facility] in [place], information in discharge instructions. Activity:  as tolerated  Diet:  heart healthy diet  Disposition: discharge home [place], NP 01/09/2021, 10:53 AM

## 2021-01-09 NOTE — Discharge Instructions (Signed)
[facility]  [place] COVID-19 info: [email or web address] Located in: [facility] Address: [address], [place], [place] [address]
